# Patient Record
Sex: Female | Born: 1945 | Race: White | Hispanic: No | Marital: Married
Health system: Southern US, Academic
[De-identification: ages and names within clinical notes are randomized; demographics above are authoritative.]

## PROBLEM LIST (undated history)

## (undated) ENCOUNTER — Encounter: Attending: Gynecologic Oncology | Primary: Gynecologic Oncology

## (undated) ENCOUNTER — Encounter
Attending: Student in an Organized Health Care Education/Training Program | Primary: Student in an Organized Health Care Education/Training Program

## (undated) ENCOUNTER — Encounter

## (undated) ENCOUNTER — Encounter: Attending: Women's Health | Primary: Women's Health

## (undated) ENCOUNTER — Telehealth

## (undated) ENCOUNTER — Ambulatory Visit

## (undated) ENCOUNTER — Ambulatory Visit: Payer: MEDICARE

## (undated) ENCOUNTER — Telehealth: Attending: Gynecologic Oncology | Primary: Gynecologic Oncology

## (undated) ENCOUNTER — Telehealth: Attending: Women's Health | Primary: Women's Health

## (undated) ENCOUNTER — Ambulatory Visit: Payer: MEDICARE | Attending: Gynecologic Oncology | Primary: Gynecologic Oncology

## (undated) ENCOUNTER — Encounter: Attending: Child & Adolescent Psychiatry | Primary: Child & Adolescent Psychiatry

## (undated) ENCOUNTER — Telehealth
Attending: Student in an Organized Health Care Education/Training Program | Primary: Student in an Organized Health Care Education/Training Program

## (undated) ENCOUNTER — Ambulatory Visit
Payer: MEDICARE | Attending: Rehabilitative and Restorative Service Providers" | Primary: Rehabilitative and Restorative Service Providers"

## (undated) ENCOUNTER — Inpatient Hospital Stay

## (undated) ENCOUNTER — Encounter: Attending: Adult Health | Primary: Adult Health

## (undated) ENCOUNTER — Encounter
Attending: Rehabilitative and Restorative Service Providers" | Primary: Rehabilitative and Restorative Service Providers"

## (undated) ENCOUNTER — Ambulatory Visit: Payer: MEDICARE | Attending: Clinical | Primary: Clinical

## (undated) ENCOUNTER — Telehealth: Attending: Obstetrics & Gynecology | Primary: Obstetrics & Gynecology

## (undated) ENCOUNTER — Ambulatory Visit: Attending: Gynecologic Oncology | Primary: Gynecologic Oncology

## (undated) ENCOUNTER — Telehealth: Attending: Nurse Practitioner | Primary: Nurse Practitioner

## (undated) DIAGNOSIS — R32 Unspecified urinary incontinence: Secondary | ICD-10-CM

## (undated) DIAGNOSIS — F329 Major depressive disorder, single episode, unspecified: Secondary | ICD-10-CM

## (undated) DIAGNOSIS — F32A Depression, unspecified: Secondary | ICD-10-CM

## (undated) DIAGNOSIS — I10 Essential (primary) hypertension: Secondary | ICD-10-CM

## (undated) HISTORY — PX: FOOT SURGERY: SHX648

## (undated) MED ORDER — MEGESTROL 400 MG/10 ML (40 MG/ML) ORAL SUSPENSION: Freq: Every day | ORAL | 0.00000 days | Status: SS

## (undated) MED ORDER — TRAZODONE 100 MG TABLET: Freq: Every evening | ORAL | 0.00000 days | Status: SS

## (undated) MED ORDER — ALENDRONATE 70 MG TABLET: ORAL | 0.00000 days | Status: SS

---
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
## 2015-02-19 ENCOUNTER — Emergency Department: Payer: Medicare Other

## 2015-02-19 ENCOUNTER — Encounter: Payer: Self-pay | Admitting: Emergency Medicine

## 2015-02-19 ENCOUNTER — Emergency Department
Admission: EM | Admit: 2015-02-19 | Discharge: 2015-02-19 | Disposition: A | Discharge status: Home / Self Care | Payer: Medicare Other | Attending: Emergency Medicine | Admitting: Emergency Medicine

## 2015-02-19 DIAGNOSIS — R51 Headache: Secondary | ICD-10-CM | POA: Diagnosis not present

## 2015-02-19 DIAGNOSIS — W01198A Fall on same level from slipping, tripping and stumbling with subsequent striking against other object, initial encounter: Secondary | ICD-10-CM | POA: Diagnosis not present

## 2015-02-19 DIAGNOSIS — Y9389 Activity, other specified: Secondary | ICD-10-CM | POA: Diagnosis not present

## 2015-02-19 DIAGNOSIS — I10 Essential (primary) hypertension: Secondary | ICD-10-CM | POA: Insufficient documentation

## 2015-02-19 DIAGNOSIS — S0181XA Laceration without foreign body of other part of head, initial encounter: Secondary | ICD-10-CM | POA: Insufficient documentation

## 2015-02-19 DIAGNOSIS — Y998 Other external cause status: Secondary | ICD-10-CM | POA: Insufficient documentation

## 2015-02-19 DIAGNOSIS — Y9289 Other specified places as the place of occurrence of the external cause: Secondary | ICD-10-CM | POA: Insufficient documentation

## 2015-02-19 DIAGNOSIS — S5002XA Contusion of left elbow, initial encounter: Secondary | ICD-10-CM | POA: Diagnosis not present

## 2015-02-19 DIAGNOSIS — S0191XA Laceration without foreign body of unspecified part of head, initial encounter: Secondary | ICD-10-CM

## 2015-02-19 DIAGNOSIS — S60211A Contusion of right wrist, initial encounter: Secondary | ICD-10-CM | POA: Diagnosis not present

## 2015-02-19 DIAGNOSIS — S60212A Contusion of left wrist, initial encounter: Secondary | ICD-10-CM | POA: Diagnosis not present

## 2015-02-19 HISTORY — DX: Depression, unspecified: F32.A

## 2015-02-19 HISTORY — DX: Major depressive disorder, single episode, unspecified: F32.9

## 2015-02-19 HISTORY — DX: Unspecified urinary incontinence: R32

## 2015-02-19 HISTORY — DX: Essential (primary) hypertension: I10

## 2015-02-19 MED ORDER — LIDOCAINE-EPINEPHRINE 2 %-1:100000 IJ SOLN
INTRAMUSCULAR | Status: AC
  Filled 2015-02-19: qty 1.7

## 2015-02-19 MED ORDER — BACITRACIN ZINC 500 UNIT/GM EX OINT
TOPICAL_OINTMENT | Freq: Once | CUTANEOUS | Status: AC
  Administered 2015-02-19: 1 via TOPICAL

## 2015-02-19 MED ORDER — BACITRACIN ZINC 500 UNIT/GM EX OINT
TOPICAL_OINTMENT | CUTANEOUS | Status: AC
  Administered 2015-02-19: 1 via TOPICAL
  Filled 2015-02-19: qty 0.9

## 2015-02-19 NOTE — Discharge Instructions (Signed)
Stitches, Staples, or Adhesive Wound Closure Health care providers use stitches (sutures), staples, and certain glue (skin adhesives) to hold skin together while it heals (wound closure). You may need this treatment after you have surgery or if you cut your skin accidentally. These methods help your skin to heal more quickly and make it less likely that you will have a scar. A wound may take several months to heal completely. The type of wound you have determines when your wound gets closed. In most cases, the wound is closed as soon as possible (primary skin closure). Sometimes, closure is delayed so the wound can be cleaned and allowed to heal naturally. This reduces the chance of infection. Delayed closure may be needed if your wound:  Is caused by a bite.  Happened more than 6 hours ago.  Involves loss of skin or the tissues under the skin.  Has dirt or debris in it that cannot be removed.  Is infected. WHAT ARE THE DIFFERENT KINDS OF WOUND CLOSURES? There are many options for wound closure. The one that your health care provider uses depends on how deep and how large your wound is. Adhesive Glue To use this type of glue to close a wound, your health care provider holds the edges of the wound together and paints the glue on the surface of your skin. You may need more than one layer of glue. Then the wound may be covered with a light bandage (dressing). This type of skin closure may be used for small wounds that are not deep (superficial). Using glue for wound closure is less painful than other methods. It does not require a medicine that numbs the area (local anesthetic). This method also leaves nothing to be removed. Adhesive glue is often used for children and on facial wounds. Adhesive glue cannot be used for wounds that are deep, uneven, or bleeding. It is not used inside of a wound.  Adhesive Strips These strips are made of sticky (adhesive), porous paper. They are applied across your  skin edges like a regular adhesive bandage. You leave them on until they fall off. Adhesive strips may be used to close very superficial wounds. They may also be used along with sutures to improve the closure of your skin edges.  Sutures Sutures are the oldest method of wound closure. Sutures can be made from natural substances, such as silk, or from synthetic materials, such as nylon and steel. They can be made from a material that your body can break down as your wound heals (absorbable), or they can be made from a material that needs to be removed from your skin (nonabsorbable). They come in many different strengths and sizes. Your health care provider attaches the sutures to a steel needle on one end. Sutures can be passed through your skin, or through the tissues beneath your skin. Then they are tied and cut. Your skin edges may be closed in one continuous stitch or in separate stitches. Sutures are strong and can be used for all kinds of wounds. Absorbable sutures may be used to close tissues under the skin. The disadvantage of sutures is that they may cause skin reactions that lead to infection. Nonabsorbable sutures need to be removed. Staples When surgical staples are used to close a wound, the edges of your skin on both sides of the wound are brought close together. A staple is placed across the wound, and an instrument secures the edges together. Staples are often used to close surgical cuts (  incisions). Staples are faster to use than sutures, and they cause less skin reaction. Staples need to be removed using a tool that bends the staples away from your skin. HOW DO I CARE FOR MY WOUND CLOSURE?  Take medicines only as directed by your health care provider.  If you were prescribed an antibiotic medicine for your wound, finish it all even if you start to feel better.  Use ointments or creams only as directed by your health care provider.  Wash your hands with soap and water before and  after touching your wound.  Do not soak your wound in water. Do not take baths, swim, or use a hot tub until your health care provider approves.  Ask your health care provider when you can start showering. Cover your wound if directed by your health care provider.  Do not take out your own sutures or staples.  Do not pick at your wound. Picking can cause an infection.  Keep all follow-up visits as directed by your health care provider. This is important. HOW LONG WILL I HAVE MY WOUND CLOSURE?  Leave adhesive glue on your skin until the glue peels away.  Leave adhesive strips on your skin until the strips fall off.  Absorbable sutures will dissolve within several days.  Nonabsorbable sutures and staples must be removed. The location of the wound will determine how long they stay in. This can range from several days to a couple of weeks. WHEN SHOULD I SEEK HELP FOR MY WOUND CLOSURE? Contact your health care provider if:  You have a fever.  You have chills.  You have drainage, redness, swelling, or pain at your wound.  There is a bad smell coming from your wound.  The skin edges of your wound start to separate after your sutures have been removed.  Your wound becomes thick, raised, and darker in color after your sutures come out (scarring).   This information is not intended to replace advice given to you by your health care provider. Make sure you discuss any questions you have with your health care provider.   Document Released: 12/25/2000 Document Revised: 04/22/2014 Document Reviewed: 09/08/2013 Elsevier Interactive Patient Education [employer]2016 [employer]  Please keep the wound clean and dry. You can shower or even tonight do not submerge her head in standing water. The careful not to catch a comb or brush in the staples. Please return for worse headache nausea vomiting visual changes. Please also return if the wound gets more painful swollen red or pussy. I recommend getting  the wound checked in 2 days and I would get the staples out in 7.

## 2015-02-19 NOTE — ED Provider Notes (Signed)
[facility] Emergency Department Provider Note  ____________________________________________  Time seen: Approximately 6:22 PM  I have reviewed the triage vital signs and the nursing notes.   HISTORY  Chief Complaint Fall and Head Laceration    HPI Kayla Ferrell is a 69 y.o. female who tripped and fell hitting her elbow and head on a car. Patient did not pass out is not nauseated and vomiting has no visual changes or ringing in the ears. Patient says she can move her wrist which is also somewhat bruised and elbow without any pain or or impairment. Patient has fallen in the past but not in the last year. Patient does have a mild headache after the fall.   Past Medical History  Diagnosis Date  . Hypertension   . Depression   . Incontinence     There are no active problems to display for this patient.   Past Surgical History  Procedure Laterality Date  . Foot surgery      left foot    No current outpatient prescriptions on file.  Allergies Codeine and Sulfa antibiotics  History reviewed. No pertinent family history.  Social History Social History  Substance Use Topics  . Smoking status: Never Smoker   . Smokeless tobacco: None  . Alcohol Use: Yes    Review of Systems Constitutional: No fever/chills Eyes: No visual changes. ENT: No sore throat. Cardiovascular: Denies chest pain. Respiratory: Denies shortness of breath. Gastrointestinal: No abdominal pain.  No nausea, no vomiting.  No diarrhea.  No constipation. Genitourinary: Negative for dysuria. Musculoskeletal: Negative for back pain. Skin: Negative for rash. Neurological: Negative for  focal weakness or numbness.  10-point ROS otherwise negative.  ____________________________________________   PHYSICAL EXAM:  VITAL SIGNS: ED Triage Vitals  Enc Vitals Group     BP 02/19/15 1530 129/69 mmHg     Pulse Rate 02/19/15 1530 81     Resp 02/19/15 1530 18     Temp 02/19/15 1530  97.8 F (36.6 C)     Temp Source 02/19/15 1530 Oral     SpO2 02/19/15 1530 99 %     Weight 02/19/15 1530 158 lb (71.668 kg)     Height 02/19/15 1530 5\' 1"  (1.549 m)     Head Cir --      Peak Flow --      Pain Score 02/19/15 1531 2     Pain Loc --      Pain Edu? --      Excl. in GC? --     Constitutional: Alert and oriented. Well appearing and in no acute distress. Eyes: Conjunctivae are normal. PERRL. EOMI. Head: Atraumatic except for 1-1/2 cm laceration in the area left temple. It is not bleeding at present Nose: No congestion/rhinnorhea. Mouth/Throat: Mucous membranes are moist.  Oropharynx non-erythematous. Neck: No stridor.  No cervical spine tenderness to palpation. }Cardiovascular: Normal rate, regular rhythm. Grossly normal heart sounds.  Good peripheral circulation. Respiratory: Normal respiratory effort.  No retractions. Lungs CTAB. Gastrointestinal: Soft and nontender. No distention. No abdominal bruits. No CVA tenderness. }Musculoskeletal: No lower extremity tenderness nor edema.  No joint effusions. patient moves left elbow and wrist both of which are bruised and abraded without any problems at all there is no limitation in range of motion or pain with movement  Neurologic:  Normal speech and language. No gross focal neurologic deficits are appreciated. No gait instability. Skin:  Skin is warm, dry and intact. No rash noted. Psychiatric: Mood and affect are  normal. Speech and behavior are normal.  ____________________________________________   LABS (all labs ordered are listed, but only abnormal results are displayed)  Labs Reviewed - No data to display ____________________________________________  EKG   ____________________________________________  RADCT of the head shows no acute findings ____________________________________________   PROCEDURES   consent was obtained patient laceration was anesthetized with 1% lidocaine with epi wound was cleaned that is  the skin of the wound was cleaned with Betadine and the wound was irrigated with normal saline the wound was then explored no foreign bodies were seen in the galea was not penetrated the wound was closed with 4 staples patient tolerated this very well. ____________________________________________   INITIAL IMPRESSION / ASSESSMENT AND PLAN / ED COURSE  Pertinent labs & imaging results that were available during my care of the patient were reviewed by me and considered in my medical decision making (see chart for details).  patient will follow-up with her doctor or the ER in [facility] to get the staples out ____________________________________________   FINAL CLINICAL IMPRESSION(S) / ED DIAGNOSES  Final diagnoses:  Laceration of head, initial encounter      Arnaldo Natal, MD 02/19/15 [phone]

## 2015-02-19 NOTE — ED Notes (Signed)
Pt presents via EMS from home with a fall and head laceration. Pt slipped and fell outside over concrete , broke her fall with left arm and elbow, hit head, and hs a 1 inch left side laceration. Pt is alert and oriented x4 on arrival.

## 2016-10-18 ENCOUNTER — Ambulatory Visit: Admission: RE | Admit: 2016-10-18 | Discharge: 2016-10-18 | Disposition: A | Discharge status: Home / Self Care

## 2016-10-18 DIAGNOSIS — M810 Age-related osteoporosis without current pathological fracture: Principal | ICD-10-CM

## 2016-10-18 DIAGNOSIS — R2689 Other abnormalities of gait and mobility: Secondary | ICD-10-CM

## 2016-10-18 DIAGNOSIS — Z9181 History of falling: Secondary | ICD-10-CM

## 2016-10-18 DIAGNOSIS — Z79899 Other long term (current) drug therapy: Secondary | ICD-10-CM

## 2016-10-18 DIAGNOSIS — Z9189 Other specified personal risk factors, not elsewhere classified: Secondary | ICD-10-CM

## 2016-10-18 DIAGNOSIS — Z0000 Encounter for general adult medical examination without abnormal findings: Secondary | ICD-10-CM

## 2016-10-23 MED ORDER — OXYBUTYNIN CHLORIDE 5 MG TABLET
ORAL_TABLET | Freq: Three times a day (TID) | ORAL | 0 refills | 0 days | Status: CP
Start: 2016-10-23 — End: 2016-12-10

## 2016-11-29 ENCOUNTER — Ambulatory Visit
Admission: RE | Admit: 2016-11-29 | Discharge: 2016-11-29 | Disposition: A | Discharge status: Home / Self Care | Attending: Internal Medicine | Admitting: Internal Medicine

## 2016-11-29 DIAGNOSIS — M810 Age-related osteoporosis without current pathological fracture: Principal | ICD-10-CM

## 2016-11-29 MED ORDER — ALENDRONATE 70 MG TABLET
ORAL_TABLET | ORAL | 3 refills | 0.00000 days | Status: CP
Start: 2016-11-29 — End: 2017-12-09

## 2016-12-10 ENCOUNTER — Ambulatory Visit: Admission: RE | Admit: 2016-12-10 | Discharge: 2016-12-10

## 2016-12-10 DIAGNOSIS — I10 Essential (primary) hypertension: Principal | ICD-10-CM

## 2016-12-10 DIAGNOSIS — R634 Abnormal weight loss: Secondary | ICD-10-CM

## 2016-12-10 DIAGNOSIS — R5383 Other fatigue: Secondary | ICD-10-CM

## 2016-12-10 DIAGNOSIS — R0781 Pleurodynia: Secondary | ICD-10-CM

## 2016-12-10 MED ORDER — LISINOPRIL 5 MG TABLET
ORAL_TABLET | Freq: Every day | ORAL | 1 refills | 0 days | Status: SS
Start: 2016-12-10 — End: 2017-07-02

## 2016-12-10 MED ORDER — OXYBUTYNIN CHLORIDE 5 MG TABLET
ORAL_TABLET | Freq: Two times a day (BID) | ORAL | 1 refills | 0 days | Status: CP
Start: 2016-12-10 — End: 2017-07-26

## 2017-01-10 ENCOUNTER — Ambulatory Visit
Admission: RE | Admit: 2017-01-10 | Discharge: 2017-02-08 | Disposition: A | Discharge status: Home / Self Care | Payer: MEDICARE | Attending: Rehabilitative and Restorative Service Providers" | Admitting: Rehabilitative and Restorative Service Providers"

## 2017-01-10 ENCOUNTER — Ambulatory Visit
Admission: RE | Admit: 2017-01-10 | Discharge: 2017-02-08 | Disposition: A | Discharge status: Home / Self Care | Attending: Rehabilitative and Restorative Service Providers" | Admitting: Rehabilitative and Restorative Service Providers"

## 2017-01-10 DIAGNOSIS — R2689 Other abnormalities of gait and mobility: Secondary | ICD-10-CM

## 2017-01-10 DIAGNOSIS — Z9181 History of falling: Principal | ICD-10-CM

## 2017-01-15 ENCOUNTER — Ambulatory Visit: Admission: RE | Admit: 2017-01-15 | Discharge: 2017-01-15 | Disposition: A | Discharge status: Home / Self Care

## 2017-01-15 DIAGNOSIS — R5383 Other fatigue: Secondary | ICD-10-CM

## 2017-01-15 DIAGNOSIS — I10 Essential (primary) hypertension: Principal | ICD-10-CM

## 2017-01-15 DIAGNOSIS — R634 Abnormal weight loss: Secondary | ICD-10-CM

## 2017-01-15 DIAGNOSIS — R0781 Pleurodynia: Secondary | ICD-10-CM

## 2017-01-24 DIAGNOSIS — M6281 Muscle weakness (generalized): Secondary | ICD-10-CM

## 2017-01-24 DIAGNOSIS — R2689 Other abnormalities of gait and mobility: Secondary | ICD-10-CM

## 2017-01-24 DIAGNOSIS — Z9181 History of falling: Principal | ICD-10-CM

## 2017-01-31 DIAGNOSIS — Z9181 History of falling: Principal | ICD-10-CM

## 2017-01-31 DIAGNOSIS — R2689 Other abnormalities of gait and mobility: Secondary | ICD-10-CM

## 2017-01-31 DIAGNOSIS — M6281 Muscle weakness (generalized): Secondary | ICD-10-CM

## 2017-02-07 DIAGNOSIS — M6281 Muscle weakness (generalized): Secondary | ICD-10-CM

## 2017-02-07 DIAGNOSIS — Z9181 History of falling: Principal | ICD-10-CM

## 2017-02-07 DIAGNOSIS — R2689 Other abnormalities of gait and mobility: Secondary | ICD-10-CM

## 2017-02-14 ENCOUNTER — Ambulatory Visit
Admission: RE | Admit: 2017-02-14 | Discharge: 2017-03-10 | Disposition: A | Discharge status: Home / Self Care | Attending: Rehabilitative and Restorative Service Providers" | Admitting: Rehabilitative and Restorative Service Providers"

## 2017-02-14 DIAGNOSIS — Z9181 History of falling: Principal | ICD-10-CM

## 2017-02-14 DIAGNOSIS — R2689 Other abnormalities of gait and mobility: Secondary | ICD-10-CM

## 2017-02-14 DIAGNOSIS — M6281 Muscle weakness (generalized): Secondary | ICD-10-CM

## 2017-02-26 IMAGING — CT CT HEAD W/O CM
1 series · 16 of 30 positions shown, 20 images · non-contrast
Comparison: None.

CLINICAL DATA: Fell today.  Hit head.

EXAM:
CT HEAD WITHOUT CONTRAST
TECHNIQUE: Contiguous axial images were obtained from the base of the skull
through the vertex without intravenous contrast.

[Series 2: head wo · axial · 0.39mm/px · z∈[-187,-58]mm · 16 of 30 slices shown, 20 images]
[im 2/30  brain]
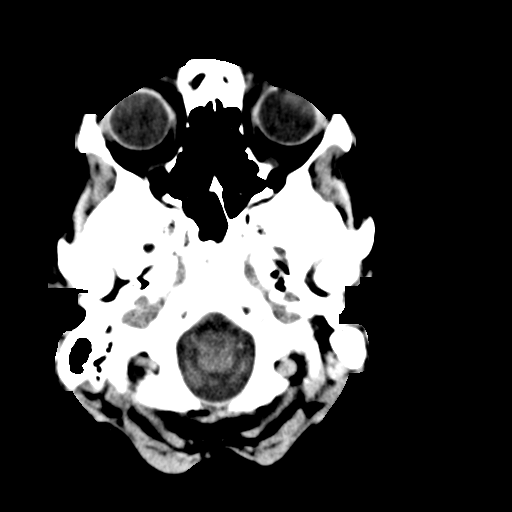
[im 2/30  bone]
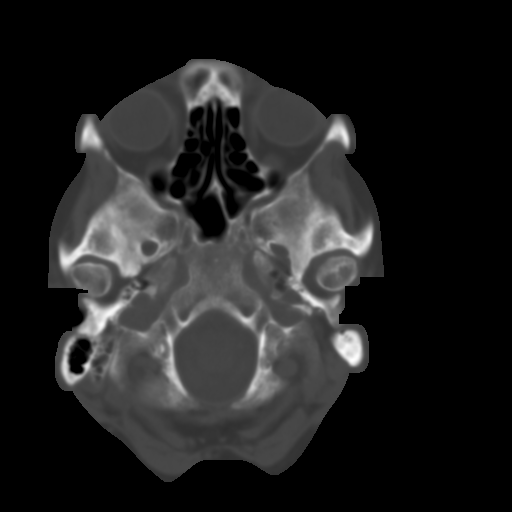
[im 4/30  brain]
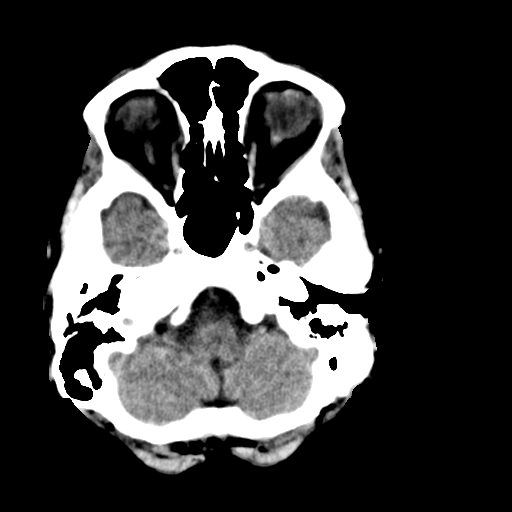
[im 6/30  brain]
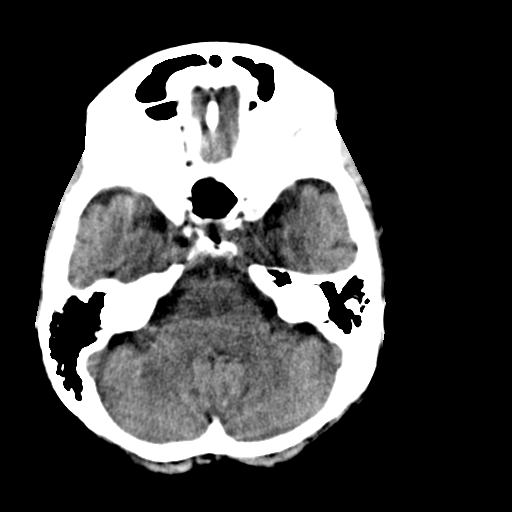
[im 8/30  brain]
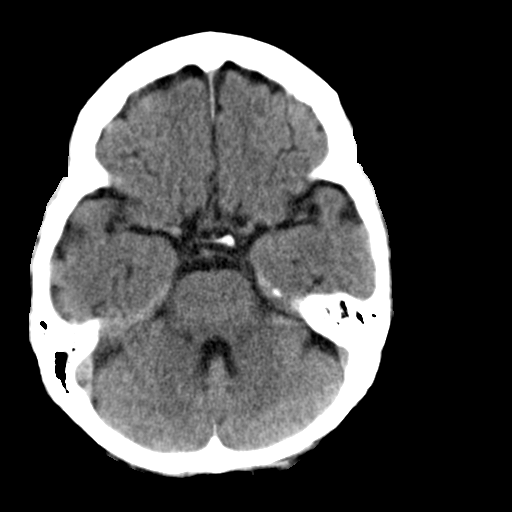
[im 9/30  brain]
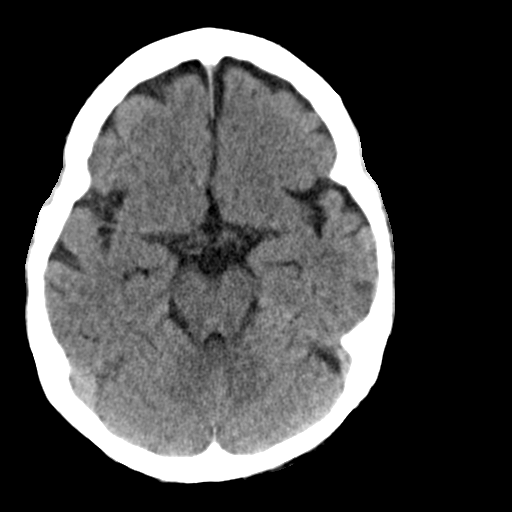
[im 9/30  bone]
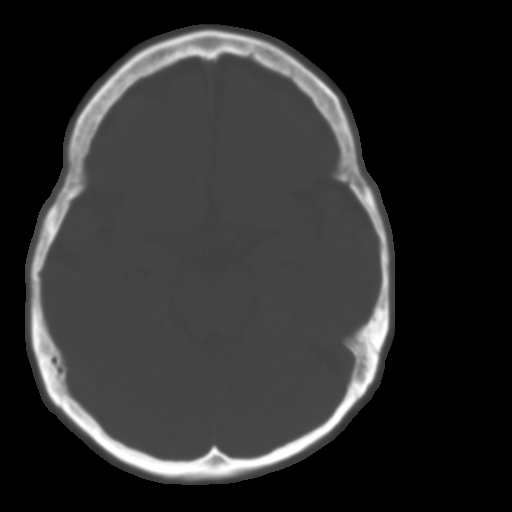
[im 11/30  brain]
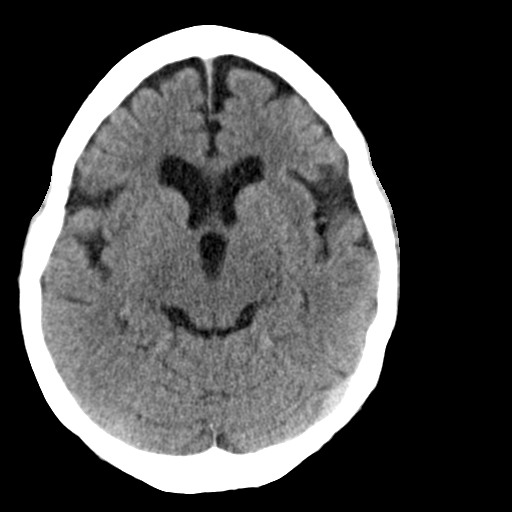
[im 13/30  brain]
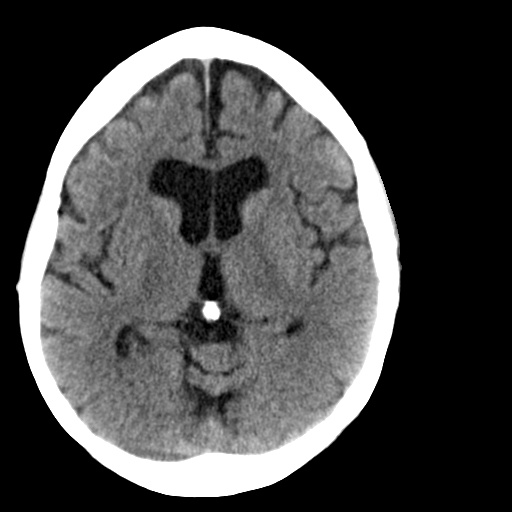
[im 15/30  brain]
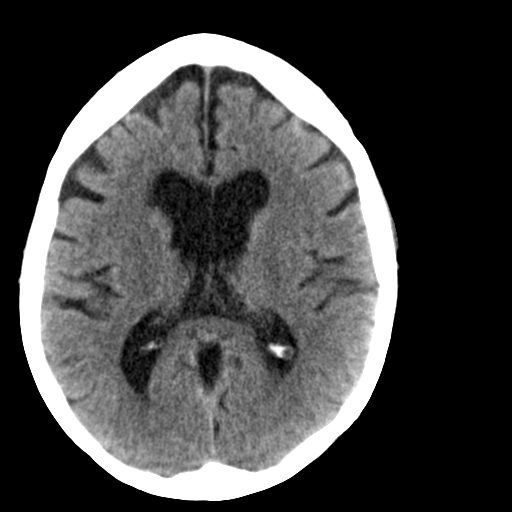
[im 16/30  brain]
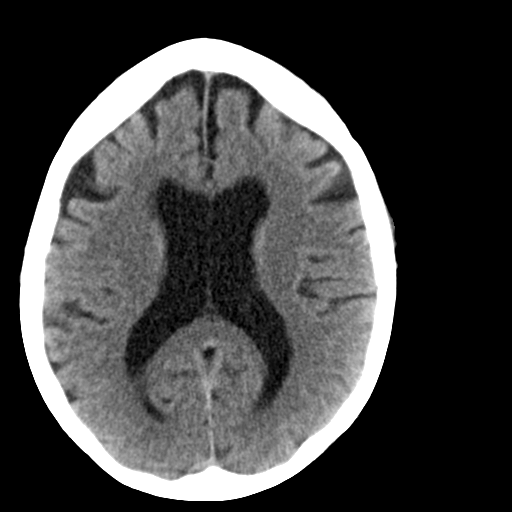
[im 16/30  bone]
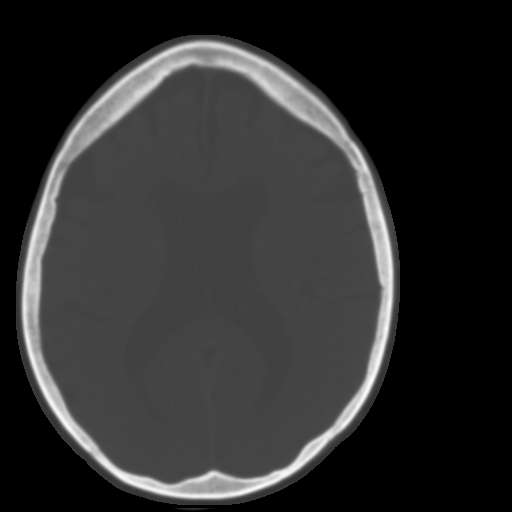
[im 18/30  brain]
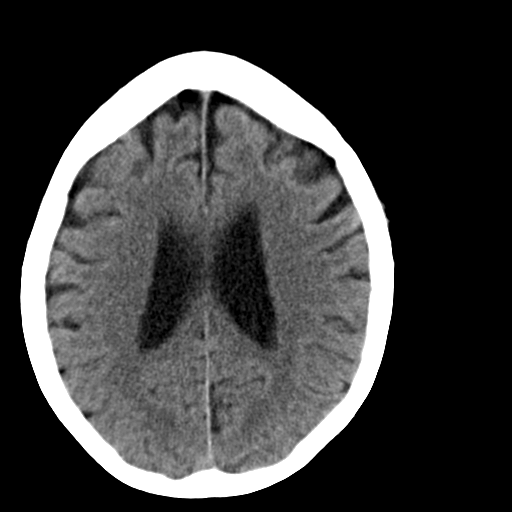
[im 20/30  brain]
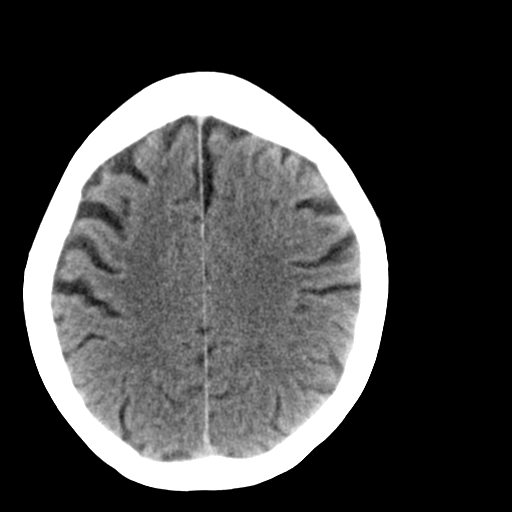
[im 22/30  brain]
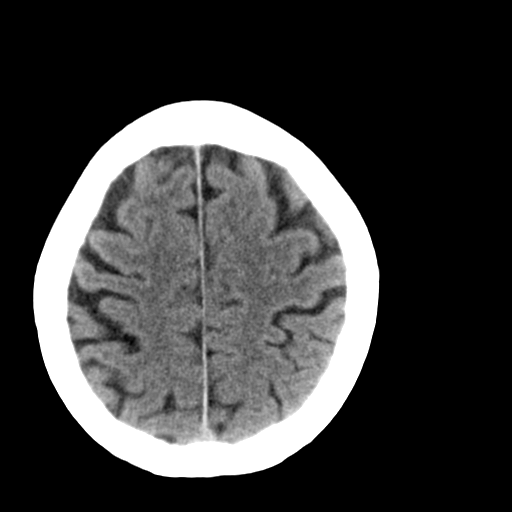
[im 23/30  brain]
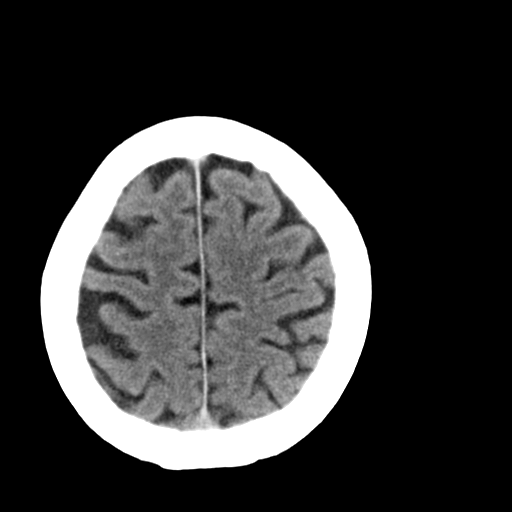
[im 23/30  bone]
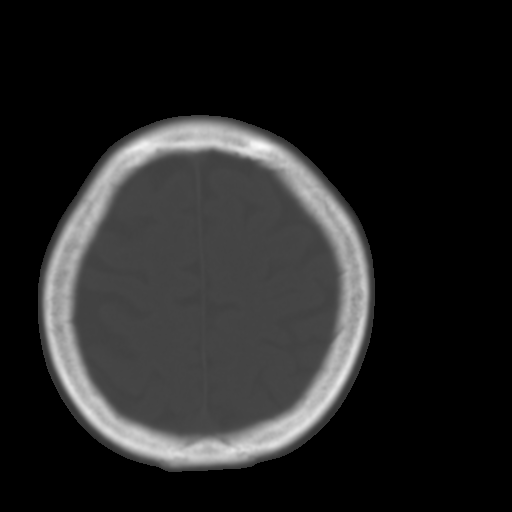
[im 25/30  brain]
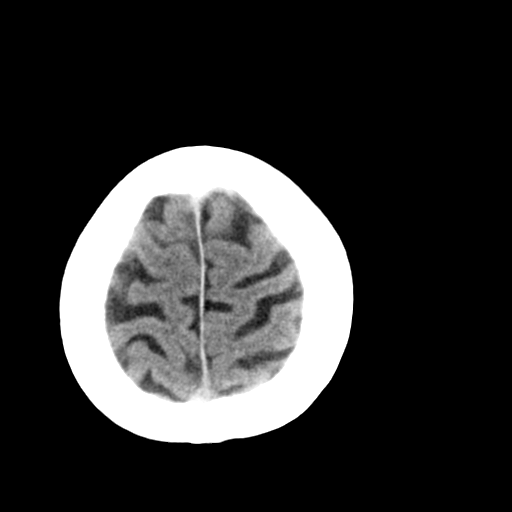
[im 27/30  brain]
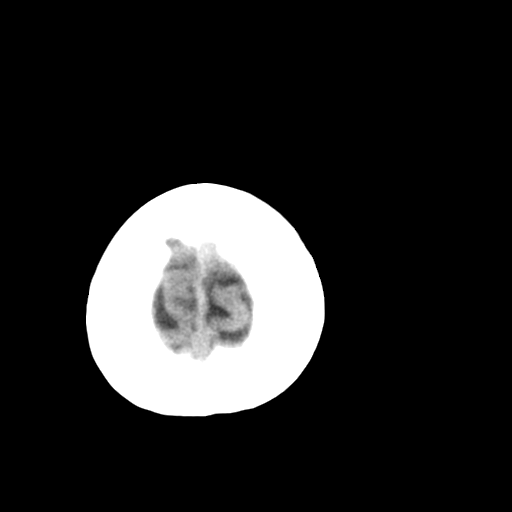
[im 29/30  brain]
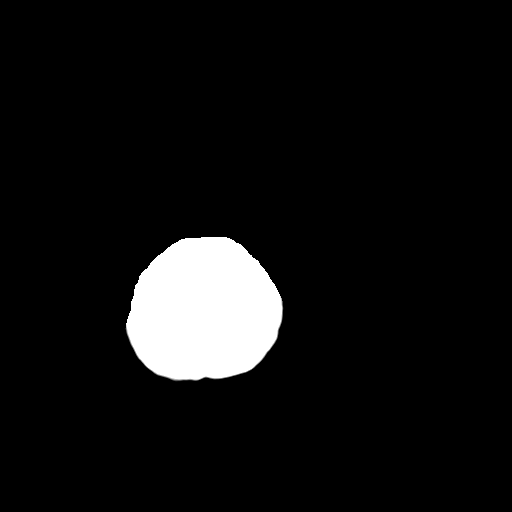

[16 of 30 positions shown; findings below may reference images not displayed]

FINDINGS: The ventricles are normal in size and configuration. No extra-axial
fluid collections are identified. The gray-white differentiation is
normal. No CT findings for acute intracranial process such as
hemorrhage or infarction. No mass lesions. The brainstem and
cerebellum are grossly normal.

The bony structures are intact. The paranasal sinuses and mastoid
air cells are clear. The globes are intact.
IMPRESSION: No acute intracranial findings or skull fracture.

## 2017-03-16 ENCOUNTER — Emergency Department
Admission: EM | Admit: 2017-03-16 | Discharge: 2017-03-16 | Disposition: A | Discharge status: Home / Self Care | Payer: MEDICARE | Source: Intra-hospital | Attending: Family | Admitting: Family

## 2017-03-16 ENCOUNTER — Emergency Department
Admission: EM | Admit: 2017-03-16 | Discharge: 2017-03-16 | Disposition: A | Discharge status: Home / Self Care | Payer: MEDICARE | Source: Intra-hospital

## 2017-03-16 DIAGNOSIS — R109 Unspecified abdominal pain: Principal | ICD-10-CM

## 2017-04-10 ENCOUNTER — Ambulatory Visit
Admission: RE | Admit: 2017-04-10 | Discharge: 2017-05-09 | Disposition: A | Discharge status: Home / Self Care | Attending: Rehabilitative and Restorative Service Providers" | Admitting: Rehabilitative and Restorative Service Providers"

## 2017-04-10 ENCOUNTER — Ambulatory Visit
Admission: RE | Admit: 2017-04-10 | Discharge: 2017-05-09 | Disposition: A | Discharge status: Home / Self Care | Payer: MEDICARE | Attending: Rehabilitative and Restorative Service Providers" | Admitting: Rehabilitative and Restorative Service Providers"

## 2017-04-10 DIAGNOSIS — R2689 Other abnormalities of gait and mobility: Secondary | ICD-10-CM

## 2017-04-10 DIAGNOSIS — M6281 Muscle weakness (generalized): Secondary | ICD-10-CM

## 2017-04-10 DIAGNOSIS — Z9181 History of falling: Principal | ICD-10-CM

## 2017-05-13 ENCOUNTER — Ambulatory Visit: Admit: 2017-05-13 | Discharge: 2017-05-14 | Payer: MEDICARE

## 2017-05-13 DIAGNOSIS — K5900 Constipation, unspecified: Secondary | ICD-10-CM

## 2017-05-13 DIAGNOSIS — R935 Abnormal findings on diagnostic imaging of other abdominal regions, including retroperitoneum: Secondary | ICD-10-CM

## 2017-05-13 DIAGNOSIS — R109 Unspecified abdominal pain: Secondary | ICD-10-CM

## 2017-05-13 DIAGNOSIS — I10 Essential (primary) hypertension: Principal | ICD-10-CM

## 2017-05-13 DIAGNOSIS — Z79899 Other long term (current) drug therapy: Secondary | ICD-10-CM

## 2017-06-16 ENCOUNTER — Ambulatory Visit: Admit: 2017-06-16 | Discharge: 2017-06-17 | Payer: MEDICARE

## 2017-06-16 DIAGNOSIS — K5900 Constipation, unspecified: Principal | ICD-10-CM

## 2017-06-16 DIAGNOSIS — R935 Abnormal findings on diagnostic imaging of other abdominal regions, including retroperitoneum: Secondary | ICD-10-CM

## 2017-06-16 DIAGNOSIS — R109 Unspecified abdominal pain: Secondary | ICD-10-CM

## 2017-06-17 ENCOUNTER — Ambulatory Visit: Admit: 2017-06-17 | Discharge: 2017-06-18 | Payer: MEDICARE

## 2017-06-17 DIAGNOSIS — R1909 Other intra-abdominal and pelvic swelling, mass and lump: Secondary | ICD-10-CM

## 2017-06-17 DIAGNOSIS — N838 Other noninflammatory disorders of ovary, fallopian tube and broad ligament: Principal | ICD-10-CM

## 2017-06-27 ENCOUNTER — Ambulatory Visit: Admit: 2017-06-27 | Discharge: 2017-06-27 | Payer: MEDICARE

## 2017-06-27 DIAGNOSIS — Z1239 Encounter for other screening for malignant neoplasm of breast: Principal | ICD-10-CM

## 2017-06-30 DIAGNOSIS — N838 Other noninflammatory disorders of ovary, fallopian tube and broad ligament: Principal | ICD-10-CM

## 2017-06-30 DIAGNOSIS — Z01818 Encounter for other preprocedural examination: Secondary | ICD-10-CM

## 2017-06-30 DIAGNOSIS — N9489 Other specified conditions associated with female genital organs and menstrual cycle: Principal | ICD-10-CM

## 2017-06-30 DIAGNOSIS — C569 Malignant neoplasm of unspecified ovary: Principal | ICD-10-CM

## 2017-06-30 DIAGNOSIS — R1909 Other intra-abdominal and pelvic swelling, mass and lump: Secondary | ICD-10-CM

## 2017-07-01 ENCOUNTER — Ambulatory Visit
Admit: 2017-07-01 | Discharge: 2017-07-09 | Disposition: A | Discharge status: Skilled Nursing Facility | Payer: MEDICARE | Attending: Gynecologic Oncology | Admitting: Gynecologic Oncology

## 2017-07-01 ENCOUNTER — Ambulatory Visit
Admit: 2017-07-01 | Discharge: 2017-07-09 | Disposition: A | Discharge status: Skilled Nursing Facility | Payer: MEDICARE | Admitting: Gynecologic Oncology

## 2017-07-01 ENCOUNTER — Encounter
Admit: 2017-07-01 | Discharge: 2017-07-09 | Disposition: A | Discharge status: Skilled Nursing Facility | Payer: MEDICARE | Attending: Anesthesiology | Admitting: Gynecologic Oncology

## 2017-07-01 DIAGNOSIS — C569 Malignant neoplasm of unspecified ovary: Principal | ICD-10-CM

## 2017-07-04 DIAGNOSIS — C569 Malignant neoplasm of unspecified ovary: Principal | ICD-10-CM

## 2017-07-09 MED ORDER — POLYETHYLENE GLYCOL 3350 17 GRAM ORAL POWDER PACKET
PACK | Freq: Every day | ORAL | 0 refills | 0.00000 days | Status: CP | PRN
Start: 2017-07-09 — End: 2017-08-13

## 2017-07-09 MED ORDER — ENOXAPARIN 40 MG/0.4 ML SUBCUTANEOUS SYRINGE
SUBCUTANEOUS | 0 refills | 0 days | Status: CP
Start: 2017-07-09 — End: 2017-07-22

## 2017-07-09 MED ORDER — IBUPROFEN 600 MG TABLET
ORAL_TABLET | Freq: Four times a day (QID) | ORAL | 0 refills | 0.00000 days | Status: CP | PRN
Start: 2017-07-09 — End: 2018-01-19

## 2017-07-09 MED ORDER — ACETAMINOPHEN ER 650 MG TABLET,EXTENDED RELEASE
ORAL_TABLET | Freq: Four times a day (QID) | ORAL | 0 refills | 0.00000 days | Status: CP | PRN
Start: 2017-07-09 — End: 2018-01-19

## 2017-07-09 MED ORDER — FERROUS GLUCONATE 324 MG (38 MG IRON) TABLET
ORAL_TABLET | Freq: Every day | ORAL | 11 refills | 0.00000 days | Status: CP
Start: 2017-07-09 — End: 2017-07-22

## 2017-07-09 MED ORDER — LISINOPRIL 5 MG TABLET
ORAL_TABLET | Freq: Every day | ORAL | 0 refills | 0.00000 days | Status: CP
Start: 2017-07-09 — End: 2017-07-22

## 2017-07-09 MED ORDER — OXYCODONE 5 MG TABLET
ORAL_TABLET | ORAL | 0 refills | 0.00000 days | Status: CP | PRN
Start: 2017-07-09 — End: 2017-07-22

## 2017-07-11 ENCOUNTER — Institutional Professional Consult (permissible substitution): Admit: 2017-07-11 | Discharge: 2017-07-12 | Payer: MEDICARE

## 2017-07-16 MED ORDER — PROCHLORPERAZINE MALEATE 10 MG TABLET
ORAL_TABLET | Freq: Four times a day (QID) | ORAL | 2 refills | 0 days | Status: CP | PRN
Start: 2017-07-16 — End: 2018-01-14

## 2017-07-16 MED ORDER — ONDANSETRON HCL 8 MG TABLET
ORAL_TABLET | Freq: Three times a day (TID) | ORAL | 2 refills | 0.00000 days | Status: CP | PRN
Start: 2017-07-16 — End: 2018-01-14

## 2017-07-18 ENCOUNTER — Ambulatory Visit: Admit: 2017-07-18 | Discharge: 2017-07-19 | Payer: MEDICARE

## 2017-07-18 DIAGNOSIS — Z5111 Encounter for antineoplastic chemotherapy: Secondary | ICD-10-CM

## 2017-07-18 DIAGNOSIS — C5700 Malignant neoplasm of unspecified fallopian tube: Principal | ICD-10-CM

## 2017-07-21 ENCOUNTER — Ambulatory Visit: Admit: 2017-07-21 | Discharge: 2017-07-21 | Payer: MEDICARE

## 2017-07-21 ENCOUNTER — Ambulatory Visit
Admit: 2017-07-21 | Discharge: 2017-07-21 | Payer: MEDICARE | Attending: Gynecologic Oncology | Primary: Gynecologic Oncology

## 2017-07-21 DIAGNOSIS — C5700 Malignant neoplasm of unspecified fallopian tube: Secondary | ICD-10-CM

## 2017-07-21 DIAGNOSIS — E876 Hypokalemia: Secondary | ICD-10-CM

## 2017-07-21 DIAGNOSIS — Z5111 Encounter for antineoplastic chemotherapy: Principal | ICD-10-CM

## 2017-07-21 MED ORDER — POTASSIUM CHLORIDE ER 10 MEQ TABLET, EXTENDED RELEASE WRAPPER
ORAL_TABLET | Freq: Every day | ORAL | 1 refills | 0 days | Status: CP
Start: 2017-07-21 — End: 2018-01-20

## 2017-07-21 MED ORDER — MAGNESIUM OXIDE 400 MG (241.3 MG MAGNESIUM) TABLET
ORAL_TABLET | Freq: Every day | ORAL | 1 refills | 0 days | Status: CP
Start: 2017-07-21 — End: 2017-07-22

## 2017-07-22 ENCOUNTER — Ambulatory Visit: Admit: 2017-07-22 | Discharge: 2017-07-23 | Payer: MEDICARE

## 2017-07-22 DIAGNOSIS — C5700 Malignant neoplasm of unspecified fallopian tube: Secondary | ICD-10-CM

## 2017-07-22 DIAGNOSIS — Z5111 Encounter for antineoplastic chemotherapy: Secondary | ICD-10-CM

## 2017-07-22 DIAGNOSIS — I10 Essential (primary) hypertension: Principal | ICD-10-CM

## 2017-07-22 MED ORDER — LISINOPRIL 20 MG TABLET
ORAL_TABLET | Freq: Every day | ORAL | 5 refills | 0.00000 days | Status: CP
Start: 2017-07-22 — End: 2018-03-02

## 2017-07-22 MED ORDER — LISINOPRIL 20 MG TABLET: 20 mg | tablet | Freq: Every day | 5 refills | 0 days | Status: AC

## 2017-07-22 MED ORDER — MAGNESIUM OXIDE 400 MG (241.3 MG MAGNESIUM) TABLET
ORAL_TABLET | Freq: Every day | ORAL | 1 refills | 0.00000 days
Start: 2017-07-22 — End: 2018-01-20

## 2017-07-25 ENCOUNTER — Ambulatory Visit: Admit: 2017-07-25 | Discharge: 2017-07-26 | Payer: MEDICARE

## 2017-07-25 DIAGNOSIS — R350 Frequency of micturition: Principal | ICD-10-CM

## 2017-07-26 MED ORDER — OXYBUTYNIN CHLORIDE 5 MG TABLET
ORAL_TABLET | Freq: Four times a day (QID) | ORAL | 1 refills | 0 days | Status: CP
Start: 2017-07-26 — End: 2018-03-02

## 2017-07-29 MED ORDER — ATORVASTATIN 10 MG TABLET
ORAL_TABLET | Freq: Every evening | ORAL | 3 refills | 0 days | Status: CP
Start: 2017-07-29 — End: 2018-08-14

## 2017-08-08 ENCOUNTER — Ambulatory Visit: Admit: 2017-08-08 | Discharge: 2017-08-09 | Payer: MEDICARE

## 2017-08-08 DIAGNOSIS — C5700 Malignant neoplasm of unspecified fallopian tube: Principal | ICD-10-CM

## 2017-08-08 DIAGNOSIS — Z5111 Encounter for antineoplastic chemotherapy: Secondary | ICD-10-CM

## 2017-08-11 ENCOUNTER — Ambulatory Visit: Admit: 2017-08-11 | Discharge: 2017-08-11 | Payer: MEDICARE | Attending: Registered" | Primary: Registered"

## 2017-08-11 ENCOUNTER — Ambulatory Visit: Admit: 2017-08-11 | Discharge: 2017-08-11 | Payer: MEDICARE

## 2017-08-11 DIAGNOSIS — Z5111 Encounter for antineoplastic chemotherapy: Secondary | ICD-10-CM

## 2017-08-11 DIAGNOSIS — C5700 Malignant neoplasm of unspecified fallopian tube: Principal | ICD-10-CM

## 2017-08-13 ENCOUNTER — Ambulatory Visit: Admit: 2017-08-13 | Discharge: 2017-08-14 | Payer: MEDICARE

## 2017-08-13 DIAGNOSIS — Z79899 Other long term (current) drug therapy: Secondary | ICD-10-CM

## 2017-08-13 DIAGNOSIS — R5383 Other fatigue: Secondary | ICD-10-CM

## 2017-08-13 DIAGNOSIS — R2689 Other abnormalities of gait and mobility: Secondary | ICD-10-CM

## 2017-08-13 DIAGNOSIS — I10 Essential (primary) hypertension: Secondary | ICD-10-CM

## 2017-08-13 DIAGNOSIS — M25561 Pain in right knee: Secondary | ICD-10-CM

## 2017-08-13 DIAGNOSIS — Z0000 Encounter for general adult medical examination without abnormal findings: Principal | ICD-10-CM

## 2017-08-13 DIAGNOSIS — G8929 Other chronic pain: Secondary | ICD-10-CM

## 2017-08-29 ENCOUNTER — Ambulatory Visit: Admit: 2017-08-29 | Discharge: 2017-08-30 | Payer: MEDICARE

## 2017-08-29 DIAGNOSIS — C5700 Malignant neoplasm of unspecified fallopian tube: Principal | ICD-10-CM

## 2017-08-29 DIAGNOSIS — Z5111 Encounter for antineoplastic chemotherapy: Secondary | ICD-10-CM

## 2017-09-01 ENCOUNTER — Ambulatory Visit: Admit: 2017-09-01 | Discharge: 2017-09-02 | Payer: MEDICARE

## 2017-09-01 DIAGNOSIS — Z5111 Encounter for antineoplastic chemotherapy: Principal | ICD-10-CM

## 2017-09-01 DIAGNOSIS — C5700 Malignant neoplasm of unspecified fallopian tube: Secondary | ICD-10-CM

## 2017-09-04 ENCOUNTER — Ambulatory Visit: Admit: 2017-09-04 | Discharge: 2017-09-05 | Payer: MEDICARE

## 2017-09-04 DIAGNOSIS — Z5111 Encounter for antineoplastic chemotherapy: Secondary | ICD-10-CM

## 2017-09-04 DIAGNOSIS — C5700 Malignant neoplasm of unspecified fallopian tube: Principal | ICD-10-CM

## 2017-09-05 ENCOUNTER — Ambulatory Visit: Admit: 2017-09-05 | Discharge: 2017-09-05 | Payer: MEDICARE

## 2017-09-05 ENCOUNTER — Ambulatory Visit: Admit: 2017-09-05 | Discharge: 2017-09-05 | Payer: MEDICARE | Attending: Registered" | Primary: Registered"

## 2017-09-05 DIAGNOSIS — Z5111 Encounter for antineoplastic chemotherapy: Secondary | ICD-10-CM

## 2017-09-05 DIAGNOSIS — C5700 Malignant neoplasm of unspecified fallopian tube: Principal | ICD-10-CM

## 2017-09-05 DIAGNOSIS — Z713 Dietary counseling and surveillance: Principal | ICD-10-CM

## 2017-09-19 ENCOUNTER — Ambulatory Visit: Admit: 2017-09-19 | Discharge: 2017-09-19 | Payer: MEDICARE

## 2017-09-19 DIAGNOSIS — C5700 Malignant neoplasm of unspecified fallopian tube: Principal | ICD-10-CM

## 2017-09-19 DIAGNOSIS — Z5111 Encounter for antineoplastic chemotherapy: Secondary | ICD-10-CM

## 2017-09-19 DIAGNOSIS — C5702 Malignant neoplasm of left fallopian tube: Secondary | ICD-10-CM

## 2017-09-26 ENCOUNTER — Ambulatory Visit: Admit: 2017-09-26 | Discharge: 2017-09-26 | Payer: MEDICARE

## 2017-09-26 DIAGNOSIS — Z5111 Encounter for antineoplastic chemotherapy: Secondary | ICD-10-CM

## 2017-09-26 DIAGNOSIS — C5700 Malignant neoplasm of unspecified fallopian tube: Secondary | ICD-10-CM

## 2017-09-29 ENCOUNTER — Ambulatory Visit
Admit: 2017-09-29 | Discharge: 2017-09-30 | Payer: MEDICARE | Attending: Nurse Practitioner | Primary: Nurse Practitioner

## 2017-09-29 DIAGNOSIS — Z5111 Encounter for antineoplastic chemotherapy: Principal | ICD-10-CM

## 2017-09-29 DIAGNOSIS — C5700 Malignant neoplasm of unspecified fallopian tube: Secondary | ICD-10-CM

## 2017-10-17 ENCOUNTER — Ambulatory Visit: Admit: 2017-10-17 | Discharge: 2017-10-17 | Payer: MEDICARE

## 2017-10-17 DIAGNOSIS — F3341 Major depressive disorder, recurrent, in partial remission: Secondary | ICD-10-CM

## 2017-10-17 DIAGNOSIS — D696 Thrombocytopenia, unspecified: Secondary | ICD-10-CM

## 2017-10-17 DIAGNOSIS — R5383 Other fatigue: Principal | ICD-10-CM

## 2017-10-17 DIAGNOSIS — R42 Dizziness and giddiness: Secondary | ICD-10-CM

## 2017-10-17 DIAGNOSIS — D649 Anemia, unspecified: Secondary | ICD-10-CM

## 2017-10-17 DIAGNOSIS — Z79899 Other long term (current) drug therapy: Secondary | ICD-10-CM

## 2017-10-17 DIAGNOSIS — Z5111 Encounter for antineoplastic chemotherapy: Secondary | ICD-10-CM

## 2017-10-17 DIAGNOSIS — C5700 Malignant neoplasm of unspecified fallopian tube: Principal | ICD-10-CM

## 2017-10-20 ENCOUNTER — Ambulatory Visit: Admit: 2017-10-20 | Discharge: 2017-10-21 | Payer: MEDICARE

## 2017-10-20 DIAGNOSIS — D6481 Anemia due to antineoplastic chemotherapy: Secondary | ICD-10-CM

## 2017-10-20 DIAGNOSIS — Z5111 Encounter for antineoplastic chemotherapy: Principal | ICD-10-CM

## 2017-10-20 DIAGNOSIS — T451X5A Adverse effect of antineoplastic and immunosuppressive drugs, initial encounter: Secondary | ICD-10-CM

## 2017-10-20 DIAGNOSIS — C5700 Malignant neoplasm of unspecified fallopian tube: Secondary | ICD-10-CM

## 2017-10-27 ENCOUNTER — Ambulatory Visit: Admit: 2017-10-27 | Discharge: 2017-10-28 | Payer: MEDICARE

## 2017-10-27 DIAGNOSIS — C5700 Malignant neoplasm of unspecified fallopian tube: Principal | ICD-10-CM

## 2017-10-27 DIAGNOSIS — Z5111 Encounter for antineoplastic chemotherapy: Secondary | ICD-10-CM

## 2017-10-28 ENCOUNTER — Ambulatory Visit: Admit: 2017-10-28 | Discharge: 2017-10-29 | Payer: MEDICARE

## 2017-10-28 DIAGNOSIS — C5700 Malignant neoplasm of unspecified fallopian tube: Principal | ICD-10-CM

## 2017-10-28 DIAGNOSIS — Z5111 Encounter for antineoplastic chemotherapy: Secondary | ICD-10-CM

## 2017-11-04 MED ORDER — DOCUSATE SODIUM 100 MG CAPSULE
ORAL_CAPSULE | Freq: Two times a day (BID) | ORAL | 1 refills | 0 days | Status: CP
Start: 2017-11-04 — End: 2017-12-04

## 2017-11-04 MED ORDER — GLYCERIN (ADULT) RECTAL SUPPOSITORY
Freq: Every day | RECTAL | 0 refills | 0 days | Status: CP | PRN
Start: 2017-11-04 — End: 2017-11-08

## 2017-11-14 ENCOUNTER — Ambulatory Visit: Admit: 2017-11-14 | Discharge: 2017-11-15 | Payer: MEDICARE

## 2017-11-14 DIAGNOSIS — C5700 Malignant neoplasm of unspecified fallopian tube: Principal | ICD-10-CM

## 2017-11-14 DIAGNOSIS — Z5111 Encounter for antineoplastic chemotherapy: Secondary | ICD-10-CM

## 2017-11-17 ENCOUNTER — Ambulatory Visit: Admit: 2017-11-17 | Discharge: 2017-11-17 | Payer: MEDICARE

## 2017-11-17 ENCOUNTER — Ambulatory Visit
Admit: 2017-11-17 | Discharge: 2017-11-17 | Payer: MEDICARE | Attending: Gynecologic Oncology | Primary: Gynecologic Oncology

## 2017-11-17 DIAGNOSIS — C5700 Malignant neoplasm of unspecified fallopian tube: Principal | ICD-10-CM

## 2017-11-17 DIAGNOSIS — Z5111 Encounter for antineoplastic chemotherapy: Principal | ICD-10-CM

## 2017-11-19 ENCOUNTER — Ambulatory Visit: Admit: 2017-11-19 | Discharge: 2017-11-19 | Payer: MEDICARE

## 2017-11-19 DIAGNOSIS — J029 Acute pharyngitis, unspecified: Principal | ICD-10-CM

## 2017-11-19 DIAGNOSIS — R0981 Nasal congestion: Secondary | ICD-10-CM

## 2017-12-08 ENCOUNTER — Ambulatory Visit: Admit: 2017-12-08 | Discharge: 2017-12-08 | Payer: MEDICARE

## 2017-12-08 DIAGNOSIS — Z5111 Encounter for antineoplastic chemotherapy: Secondary | ICD-10-CM

## 2017-12-08 DIAGNOSIS — C5702 Malignant neoplasm of left fallopian tube: Principal | ICD-10-CM

## 2017-12-08 DIAGNOSIS — C5700 Malignant neoplasm of unspecified fallopian tube: Secondary | ICD-10-CM

## 2017-12-09 ENCOUNTER — Ambulatory Visit: Admit: 2017-12-09 | Discharge: 2017-12-10 | Payer: MEDICARE

## 2017-12-09 DIAGNOSIS — M810 Age-related osteoporosis without current pathological fracture: Principal | ICD-10-CM

## 2017-12-09 MED ORDER — ALENDRONATE 70 MG TABLET
ORAL_TABLET | ORAL | 3 refills | 0 days | Status: CP
Start: 2017-12-09 — End: ?

## 2017-12-26 ENCOUNTER — Ambulatory Visit: Admit: 2017-12-26 | Discharge: 2017-12-27 | Payer: MEDICARE

## 2017-12-26 ENCOUNTER — Ambulatory Visit: Admit: 2017-12-26 | Discharge: 2017-12-27 | Payer: MEDICARE | Attending: MS" | Primary: MS"

## 2017-12-26 DIAGNOSIS — C5702 Malignant neoplasm of left fallopian tube: Principal | ICD-10-CM

## 2017-12-26 DIAGNOSIS — C5700 Malignant neoplasm of unspecified fallopian tube: Secondary | ICD-10-CM

## 2018-01-15 ENCOUNTER — Ambulatory Visit: Admit: 2018-01-15 | Discharge: 2018-01-16 | Payer: MEDICARE

## 2018-01-15 DIAGNOSIS — M810 Age-related osteoporosis without current pathological fracture: Principal | ICD-10-CM

## 2018-01-16 ENCOUNTER — Ambulatory Visit: Admit: 2018-01-16 | Discharge: 2018-01-17 | Payer: MEDICARE | Attending: MS" | Primary: MS"

## 2018-01-16 DIAGNOSIS — Z800 Family history of malignant neoplasm of digestive organs: Secondary | ICD-10-CM

## 2018-01-16 DIAGNOSIS — C5700 Malignant neoplasm of unspecified fallopian tube: Principal | ICD-10-CM

## 2018-01-16 DIAGNOSIS — Z8042 Family history of malignant neoplasm of prostate: Secondary | ICD-10-CM

## 2018-01-19 ENCOUNTER — Ambulatory Visit: Admit: 2018-01-19 | Discharge: 2018-01-19 | Payer: MEDICARE

## 2018-01-19 ENCOUNTER — Ambulatory Visit
Admit: 2018-01-19 | Discharge: 2018-01-19 | Payer: MEDICARE | Attending: Gynecologic Oncology | Primary: Gynecologic Oncology

## 2018-01-19 DIAGNOSIS — C5700 Malignant neoplasm of unspecified fallopian tube: Principal | ICD-10-CM

## 2018-01-19 DIAGNOSIS — Z5111 Encounter for antineoplastic chemotherapy: Secondary | ICD-10-CM

## 2018-01-20 ENCOUNTER — Ambulatory Visit: Admit: 2018-01-20 | Discharge: 2018-01-21 | Payer: MEDICARE

## 2018-01-20 DIAGNOSIS — Z79899 Other long term (current) drug therapy: Secondary | ICD-10-CM

## 2018-01-20 DIAGNOSIS — I10 Essential (primary) hypertension: Principal | ICD-10-CM

## 2018-03-02 MED ORDER — OXYBUTYNIN CHLORIDE 5 MG TABLET
ORAL_TABLET | 1 refills | 0 days | Status: CP
Start: 2018-03-02 — End: 2018-09-29

## 2018-03-02 MED ORDER — LISINOPRIL 20 MG TABLET
ORAL_TABLET | 0 refills | 0 days | Status: CP
Start: 2018-03-02 — End: 2018-05-31

## 2018-03-17 ENCOUNTER — Ambulatory Visit: Admit: 2018-03-17 | Discharge: 2018-03-18 | Payer: MEDICARE

## 2018-03-17 DIAGNOSIS — M810 Age-related osteoporosis without current pathological fracture: Principal | ICD-10-CM

## 2018-03-17 DIAGNOSIS — R2689 Other abnormalities of gait and mobility: Secondary | ICD-10-CM

## 2018-03-20 ENCOUNTER — Ambulatory Visit
Admit: 2018-03-20 | Discharge: 2018-04-18 | Payer: MEDICARE | Attending: Rehabilitative and Restorative Service Providers" | Primary: Rehabilitative and Restorative Service Providers"

## 2018-03-20 DIAGNOSIS — R2689 Other abnormalities of gait and mobility: Secondary | ICD-10-CM

## 2018-03-20 DIAGNOSIS — G8929 Other chronic pain: Secondary | ICD-10-CM

## 2018-03-20 DIAGNOSIS — Z9181 History of falling: Secondary | ICD-10-CM

## 2018-03-20 DIAGNOSIS — M25561 Pain in right knee: Principal | ICD-10-CM

## 2018-03-20 DIAGNOSIS — M6281 Muscle weakness (generalized): Secondary | ICD-10-CM

## 2018-03-23 ENCOUNTER — Ambulatory Visit
Admit: 2018-03-23 | Discharge: 2018-03-23 | Payer: MEDICARE | Attending: Gynecologic Oncology | Primary: Gynecologic Oncology

## 2018-03-23 ENCOUNTER — Ambulatory Visit: Admit: 2018-03-23 | Discharge: 2018-03-23 | Payer: MEDICARE

## 2018-03-23 DIAGNOSIS — Z5111 Encounter for antineoplastic chemotherapy: Secondary | ICD-10-CM

## 2018-03-23 DIAGNOSIS — C5700 Malignant neoplasm of unspecified fallopian tube: Principal | ICD-10-CM

## 2018-03-25 DIAGNOSIS — Z9181 History of falling: Secondary | ICD-10-CM

## 2018-03-25 DIAGNOSIS — R2689 Other abnormalities of gait and mobility: Principal | ICD-10-CM

## 2018-04-03 DIAGNOSIS — R2689 Other abnormalities of gait and mobility: Secondary | ICD-10-CM

## 2018-04-03 DIAGNOSIS — M25561 Pain in right knee: Secondary | ICD-10-CM

## 2018-04-03 DIAGNOSIS — G8929 Other chronic pain: Secondary | ICD-10-CM

## 2018-04-03 DIAGNOSIS — Z9181 History of falling: Secondary | ICD-10-CM

## 2018-04-03 DIAGNOSIS — M6281 Muscle weakness (generalized): Secondary | ICD-10-CM

## 2018-04-09 DIAGNOSIS — R2689 Other abnormalities of gait and mobility: Principal | ICD-10-CM

## 2018-04-09 DIAGNOSIS — Z9181 History of falling: Secondary | ICD-10-CM

## 2018-04-09 DIAGNOSIS — M6281 Muscle weakness (generalized): Secondary | ICD-10-CM

## 2018-04-10 DIAGNOSIS — M6281 Muscle weakness (generalized): Secondary | ICD-10-CM

## 2018-04-10 DIAGNOSIS — Z9181 History of falling: Secondary | ICD-10-CM

## 2018-04-10 DIAGNOSIS — R2689 Other abnormalities of gait and mobility: Principal | ICD-10-CM

## 2018-04-22 ENCOUNTER — Ambulatory Visit
Admit: 2018-04-22 | Discharge: 2018-05-18 | Payer: MEDICARE | Attending: Rehabilitative and Restorative Service Providers" | Primary: Rehabilitative and Restorative Service Providers"

## 2018-04-22 DIAGNOSIS — R2689 Other abnormalities of gait and mobility: Secondary | ICD-10-CM

## 2018-04-22 DIAGNOSIS — G8929 Other chronic pain: Secondary | ICD-10-CM

## 2018-04-22 DIAGNOSIS — M6281 Muscle weakness (generalized): Secondary | ICD-10-CM

## 2018-04-22 DIAGNOSIS — M25561 Pain in right knee: Secondary | ICD-10-CM

## 2018-04-22 DIAGNOSIS — Z9181 History of falling: Secondary | ICD-10-CM

## 2018-04-24 DIAGNOSIS — M6281 Muscle weakness (generalized): Secondary | ICD-10-CM

## 2018-04-24 DIAGNOSIS — M25561 Pain in right knee: Secondary | ICD-10-CM

## 2018-04-24 DIAGNOSIS — G8929 Other chronic pain: Secondary | ICD-10-CM

## 2018-04-24 DIAGNOSIS — R2689 Other abnormalities of gait and mobility: Secondary | ICD-10-CM

## 2018-04-24 DIAGNOSIS — Z9181 History of falling: Secondary | ICD-10-CM

## 2018-04-29 DIAGNOSIS — Z9181 History of falling: Secondary | ICD-10-CM

## 2018-04-29 DIAGNOSIS — M6281 Muscle weakness (generalized): Secondary | ICD-10-CM

## 2018-04-29 DIAGNOSIS — R2689 Other abnormalities of gait and mobility: Secondary | ICD-10-CM

## 2018-04-29 DIAGNOSIS — G8929 Other chronic pain: Secondary | ICD-10-CM

## 2018-04-29 DIAGNOSIS — M25561 Pain in right knee: Secondary | ICD-10-CM

## 2018-05-01 DIAGNOSIS — R2689 Other abnormalities of gait and mobility: Secondary | ICD-10-CM

## 2018-05-01 DIAGNOSIS — M6281 Muscle weakness (generalized): Secondary | ICD-10-CM

## 2018-05-01 DIAGNOSIS — G8929 Other chronic pain: Secondary | ICD-10-CM

## 2018-05-01 DIAGNOSIS — M25561 Pain in right knee: Secondary | ICD-10-CM

## 2018-05-01 DIAGNOSIS — Z9181 History of falling: Secondary | ICD-10-CM

## 2018-05-06 DIAGNOSIS — M25561 Pain in right knee: Secondary | ICD-10-CM

## 2018-05-06 DIAGNOSIS — G8929 Other chronic pain: Secondary | ICD-10-CM

## 2018-05-06 DIAGNOSIS — M6281 Muscle weakness (generalized): Secondary | ICD-10-CM

## 2018-05-06 DIAGNOSIS — Z9181 History of falling: Secondary | ICD-10-CM

## 2018-05-06 DIAGNOSIS — R2689 Other abnormalities of gait and mobility: Secondary | ICD-10-CM

## 2018-05-13 DIAGNOSIS — R2689 Other abnormalities of gait and mobility: Principal | ICD-10-CM

## 2018-05-13 DIAGNOSIS — M6281 Muscle weakness (generalized): Secondary | ICD-10-CM

## 2018-05-13 DIAGNOSIS — Z9181 History of falling: Secondary | ICD-10-CM

## 2018-05-15 DIAGNOSIS — M25561 Pain in right knee: Secondary | ICD-10-CM

## 2018-05-15 DIAGNOSIS — G8929 Other chronic pain: Secondary | ICD-10-CM

## 2018-05-15 DIAGNOSIS — Z9181 History of falling: Secondary | ICD-10-CM

## 2018-05-15 DIAGNOSIS — M6281 Muscle weakness (generalized): Secondary | ICD-10-CM

## 2018-05-15 DIAGNOSIS — R2689 Other abnormalities of gait and mobility: Secondary | ICD-10-CM

## 2018-05-20 ENCOUNTER — Ambulatory Visit
Admit: 2018-05-20 | Discharge: 2018-06-17 | Payer: MEDICARE | Attending: Rehabilitative and Restorative Service Providers" | Primary: Rehabilitative and Restorative Service Providers"

## 2018-05-20 DIAGNOSIS — G8929 Other chronic pain: Secondary | ICD-10-CM

## 2018-05-20 DIAGNOSIS — Z9181 History of falling: Secondary | ICD-10-CM

## 2018-05-20 DIAGNOSIS — R2689 Other abnormalities of gait and mobility: Secondary | ICD-10-CM

## 2018-05-20 DIAGNOSIS — M6281 Muscle weakness (generalized): Secondary | ICD-10-CM

## 2018-05-20 DIAGNOSIS — M25561 Pain in right knee: Secondary | ICD-10-CM

## 2018-05-22 DIAGNOSIS — M6281 Muscle weakness (generalized): Secondary | ICD-10-CM

## 2018-05-22 DIAGNOSIS — Z9181 History of falling: Secondary | ICD-10-CM

## 2018-05-22 DIAGNOSIS — R2689 Other abnormalities of gait and mobility: Secondary | ICD-10-CM

## 2018-05-22 DIAGNOSIS — M25561 Pain in right knee: Secondary | ICD-10-CM

## 2018-05-22 DIAGNOSIS — G8929 Other chronic pain: Secondary | ICD-10-CM

## 2018-05-27 DIAGNOSIS — M25561 Pain in right knee: Secondary | ICD-10-CM

## 2018-05-27 DIAGNOSIS — G8929 Other chronic pain: Secondary | ICD-10-CM

## 2018-05-27 DIAGNOSIS — R2689 Other abnormalities of gait and mobility: Secondary | ICD-10-CM

## 2018-05-27 DIAGNOSIS — Z9181 History of falling: Secondary | ICD-10-CM

## 2018-05-27 DIAGNOSIS — M6281 Muscle weakness (generalized): Secondary | ICD-10-CM

## 2018-05-28 ENCOUNTER — Ambulatory Visit: Admit: 2018-05-28 | Discharge: 2018-05-29 | Payer: MEDICARE

## 2018-05-28 DIAGNOSIS — Z79899 Other long term (current) drug therapy: Secondary | ICD-10-CM

## 2018-05-28 DIAGNOSIS — C5700 Malignant neoplasm of unspecified fallopian tube: Principal | ICD-10-CM

## 2018-05-28 DIAGNOSIS — F3341 Major depressive disorder, recurrent, in partial remission: Secondary | ICD-10-CM

## 2018-05-28 DIAGNOSIS — N3941 Urge incontinence: Secondary | ICD-10-CM

## 2018-05-29 DIAGNOSIS — G8929 Other chronic pain: Secondary | ICD-10-CM

## 2018-05-29 DIAGNOSIS — Z9181 History of falling: Secondary | ICD-10-CM

## 2018-05-29 DIAGNOSIS — M6281 Muscle weakness (generalized): Secondary | ICD-10-CM

## 2018-05-29 DIAGNOSIS — R2689 Other abnormalities of gait and mobility: Secondary | ICD-10-CM

## 2018-05-29 DIAGNOSIS — M25561 Pain in right knee: Secondary | ICD-10-CM

## 2018-05-31 MED ORDER — LISINOPRIL 20 MG TABLET
1 refills | 0 days | Status: CP
Start: 2018-05-31 — End: 2018-12-14

## 2018-06-02 DIAGNOSIS — M6281 Muscle weakness (generalized): Secondary | ICD-10-CM

## 2018-06-02 DIAGNOSIS — R2689 Other abnormalities of gait and mobility: Secondary | ICD-10-CM

## 2018-06-02 DIAGNOSIS — Z9181 History of falling: Secondary | ICD-10-CM

## 2018-06-08 ENCOUNTER — Ambulatory Visit: Admit: 2018-06-08 | Discharge: 2018-06-08 | Payer: MEDICARE

## 2018-06-08 ENCOUNTER — Ambulatory Visit
Admit: 2018-06-08 | Discharge: 2018-06-08 | Payer: MEDICARE | Attending: Gynecologic Oncology | Primary: Gynecologic Oncology

## 2018-06-08 DIAGNOSIS — Z5111 Encounter for antineoplastic chemotherapy: Secondary | ICD-10-CM

## 2018-06-08 DIAGNOSIS — C5700 Malignant neoplasm of unspecified fallopian tube: Principal | ICD-10-CM

## 2018-06-09 DIAGNOSIS — Z9181 History of falling: Secondary | ICD-10-CM

## 2018-06-09 DIAGNOSIS — R2689 Other abnormalities of gait and mobility: Secondary | ICD-10-CM

## 2018-06-16 DIAGNOSIS — Z9181 History of falling: Principal | ICD-10-CM

## 2018-06-16 DIAGNOSIS — G4710 Hypersomnia, unspecified: Principal | ICD-10-CM

## 2018-06-16 DIAGNOSIS — F909 Attention-deficit hyperactivity disorder, unspecified type: Principal | ICD-10-CM

## 2018-06-16 DIAGNOSIS — F609 Personality disorder, unspecified: Principal | ICD-10-CM

## 2018-06-16 DIAGNOSIS — Z9660 Presence of unspecified orthopedic joint implant: Principal | ICD-10-CM

## 2018-06-16 DIAGNOSIS — S83209A Unspecified tear of unspecified meniscus, current injury, unspecified knee, initial encounter: Principal | ICD-10-CM

## 2018-06-16 DIAGNOSIS — T07XXXA Unspecified multiple injuries, initial encounter: Principal | ICD-10-CM

## 2018-06-16 DIAGNOSIS — M810 Age-related osteoporosis without current pathological fracture: Principal | ICD-10-CM

## 2018-06-16 DIAGNOSIS — G8929 Other chronic pain: Principal | ICD-10-CM

## 2018-06-16 DIAGNOSIS — K5900 Constipation, unspecified: Principal | ICD-10-CM

## 2018-06-16 DIAGNOSIS — Z882 Allergy status to sulfonamides status: Principal | ICD-10-CM

## 2018-06-16 DIAGNOSIS — I10 Essential (primary) hypertension: Principal | ICD-10-CM

## 2018-06-16 DIAGNOSIS — M25561 Pain in right knee: Principal | ICD-10-CM

## 2018-06-16 DIAGNOSIS — R2689 Other abnormalities of gait and mobility: Principal | ICD-10-CM

## 2018-06-16 DIAGNOSIS — Z7983 Long term (current) use of bisphosphonates: Principal | ICD-10-CM

## 2018-06-16 DIAGNOSIS — S8990XA Unspecified injury of unspecified lower leg, initial encounter: Principal | ICD-10-CM

## 2018-06-16 DIAGNOSIS — F329 Major depressive disorder, single episode, unspecified: Principal | ICD-10-CM

## 2018-06-16 DIAGNOSIS — F419 Anxiety disorder, unspecified: Principal | ICD-10-CM

## 2018-06-16 DIAGNOSIS — Z885 Allergy status to narcotic agent status: Principal | ICD-10-CM

## 2018-06-16 DIAGNOSIS — M2550 Pain in unspecified joint: Principal | ICD-10-CM

## 2018-06-16 DIAGNOSIS — G4761 Periodic limb movement disorder: Principal | ICD-10-CM

## 2018-06-16 DIAGNOSIS — Z79899 Other long term (current) drug therapy: Principal | ICD-10-CM

## 2018-06-16 DIAGNOSIS — F603 Borderline personality disorder: Principal | ICD-10-CM

## 2018-06-16 DIAGNOSIS — Z818 Family history of other mental and behavioral disorders: Principal | ICD-10-CM

## 2018-06-16 DIAGNOSIS — H832X9 Labyrinthine dysfunction, unspecified ear: Principal | ICD-10-CM

## 2018-06-16 DIAGNOSIS — G4733 Obstructive sleep apnea (adult) (pediatric): Principal | ICD-10-CM

## 2018-06-16 DIAGNOSIS — G4700 Insomnia, unspecified: Principal | ICD-10-CM

## 2018-06-16 DIAGNOSIS — N3941 Urge incontinence: Principal | ICD-10-CM

## 2018-06-16 DIAGNOSIS — M1990 Unspecified osteoarthritis, unspecified site: Principal | ICD-10-CM

## 2018-06-16 DIAGNOSIS — Z8543 Personal history of malignant neoplasm of ovary: Principal | ICD-10-CM

## 2018-06-16 DIAGNOSIS — M7120 Synovial cyst of popliteal space [Baker], unspecified knee: Principal | ICD-10-CM

## 2018-06-16 DIAGNOSIS — C569 Malignant neoplasm of unspecified ovary: Principal | ICD-10-CM

## 2018-06-16 DIAGNOSIS — R29898 Other symptoms and signs involving the musculoskeletal system: Principal | ICD-10-CM

## 2018-06-16 DIAGNOSIS — K589 Irritable bowel syndrome without diarrhea: Principal | ICD-10-CM

## 2018-06-16 DIAGNOSIS — M6281 Muscle weakness (generalized): Principal | ICD-10-CM

## 2018-06-26 ENCOUNTER — Ambulatory Visit
Admit: 2018-06-26 | Discharge: 2018-07-17 | Payer: MEDICARE | Attending: Rehabilitative and Restorative Service Providers" | Primary: Rehabilitative and Restorative Service Providers"

## 2018-06-26 DIAGNOSIS — Z9660 Presence of unspecified orthopedic joint implant: Principal | ICD-10-CM

## 2018-06-26 DIAGNOSIS — Z7983 Long term (current) use of bisphosphonates: Principal | ICD-10-CM

## 2018-06-26 DIAGNOSIS — M25561 Pain in right knee: Principal | ICD-10-CM

## 2018-06-26 DIAGNOSIS — N3941 Urge incontinence: Principal | ICD-10-CM

## 2018-06-26 DIAGNOSIS — S8990XA Unspecified injury of unspecified lower leg, initial encounter: Principal | ICD-10-CM

## 2018-06-26 DIAGNOSIS — G4733 Obstructive sleep apnea (adult) (pediatric): Principal | ICD-10-CM

## 2018-06-26 DIAGNOSIS — F603 Borderline personality disorder: Principal | ICD-10-CM

## 2018-06-26 DIAGNOSIS — F329 Major depressive disorder, single episode, unspecified: Principal | ICD-10-CM

## 2018-06-26 DIAGNOSIS — F419 Anxiety disorder, unspecified: Principal | ICD-10-CM

## 2018-06-26 DIAGNOSIS — Z818 Family history of other mental and behavioral disorders: Principal | ICD-10-CM

## 2018-06-26 DIAGNOSIS — M2550 Pain in unspecified joint: Principal | ICD-10-CM

## 2018-06-26 DIAGNOSIS — F909 Attention-deficit hyperactivity disorder, unspecified type: Principal | ICD-10-CM

## 2018-06-26 DIAGNOSIS — G4700 Insomnia, unspecified: Principal | ICD-10-CM

## 2018-06-26 DIAGNOSIS — Z882 Allergy status to sulfonamides status: Principal | ICD-10-CM

## 2018-06-26 DIAGNOSIS — S83209A Unspecified tear of unspecified meniscus, current injury, unspecified knee, initial encounter: Principal | ICD-10-CM

## 2018-06-26 DIAGNOSIS — G4710 Hypersomnia, unspecified: Principal | ICD-10-CM

## 2018-06-26 DIAGNOSIS — Z9181 History of falling: Principal | ICD-10-CM

## 2018-06-26 DIAGNOSIS — M1990 Unspecified osteoarthritis, unspecified site: Principal | ICD-10-CM

## 2018-06-26 DIAGNOSIS — M6281 Muscle weakness (generalized): Principal | ICD-10-CM

## 2018-06-26 DIAGNOSIS — Z885 Allergy status to narcotic agent status: Principal | ICD-10-CM

## 2018-06-26 DIAGNOSIS — M7120 Synovial cyst of popliteal space [Baker], unspecified knee: Principal | ICD-10-CM

## 2018-06-26 DIAGNOSIS — M810 Age-related osteoporosis without current pathological fracture: Principal | ICD-10-CM

## 2018-06-26 DIAGNOSIS — R29898 Other symptoms and signs involving the musculoskeletal system: Principal | ICD-10-CM

## 2018-06-26 DIAGNOSIS — Z79899 Other long term (current) drug therapy: Principal | ICD-10-CM

## 2018-06-26 DIAGNOSIS — T07XXXA Unspecified multiple injuries, initial encounter: Principal | ICD-10-CM

## 2018-06-26 DIAGNOSIS — G4761 Periodic limb movement disorder: Principal | ICD-10-CM

## 2018-06-26 DIAGNOSIS — I10 Essential (primary) hypertension: Principal | ICD-10-CM

## 2018-06-26 DIAGNOSIS — F609 Personality disorder, unspecified: Principal | ICD-10-CM

## 2018-06-26 DIAGNOSIS — Z8543 Personal history of malignant neoplasm of ovary: Principal | ICD-10-CM

## 2018-06-26 DIAGNOSIS — G8929 Other chronic pain: Principal | ICD-10-CM

## 2018-06-26 DIAGNOSIS — R2689 Other abnormalities of gait and mobility: Principal | ICD-10-CM

## 2018-06-26 DIAGNOSIS — C569 Malignant neoplasm of unspecified ovary: Principal | ICD-10-CM

## 2018-06-26 DIAGNOSIS — H832X9 Labyrinthine dysfunction, unspecified ear: Principal | ICD-10-CM

## 2018-06-26 DIAGNOSIS — K5900 Constipation, unspecified: Principal | ICD-10-CM

## 2018-06-26 DIAGNOSIS — K589 Irritable bowel syndrome without diarrhea: Principal | ICD-10-CM

## 2018-08-17 ENCOUNTER — Ambulatory Visit: Admit: 2018-08-17 | Discharge: 2018-08-17 | Payer: MEDICARE

## 2018-08-17 ENCOUNTER — Ambulatory Visit
Admit: 2018-08-17 | Discharge: 2018-08-17 | Payer: MEDICARE | Attending: Gynecologic Oncology | Primary: Gynecologic Oncology

## 2018-08-17 DIAGNOSIS — C5700 Malignant neoplasm of unspecified fallopian tube: Principal | ICD-10-CM

## 2018-08-17 DIAGNOSIS — Z5111 Encounter for antineoplastic chemotherapy: Secondary | ICD-10-CM

## 2018-08-25 ENCOUNTER — Ambulatory Visit: Admit: 2018-08-25 | Discharge: 2018-08-26 | Payer: MEDICARE

## 2018-08-25 DIAGNOSIS — C5700 Malignant neoplasm of unspecified fallopian tube: Secondary | ICD-10-CM

## 2018-08-25 DIAGNOSIS — C5702 Malignant neoplasm of left fallopian tube: Principal | ICD-10-CM

## 2018-09-01 ENCOUNTER — Ambulatory Visit: Admit: 2018-09-01 | Discharge: 2018-09-14 | Payer: MEDICARE

## 2018-09-01 DIAGNOSIS — C5700 Malignant neoplasm of unspecified fallopian tube: Principal | ICD-10-CM

## 2018-09-01 DIAGNOSIS — Z5111 Encounter for antineoplastic chemotherapy: Secondary | ICD-10-CM

## 2018-09-03 MED ORDER — ONDANSETRON HCL 8 MG TABLET
ORAL_TABLET | Freq: Three times a day (TID) | ORAL | 2 refills | 0.00000 days | Status: CP | PRN
Start: 2018-09-03 — End: ?

## 2018-09-03 MED ORDER — PROCHLORPERAZINE MALEATE 10 MG TABLET
ORAL_TABLET | Freq: Four times a day (QID) | ORAL | 2 refills | 0 days | Status: CP | PRN
Start: 2018-09-03 — End: ?

## 2018-09-04 ENCOUNTER — Ambulatory Visit: Admit: 2018-09-04 | Discharge: 2018-09-05 | Payer: MEDICARE

## 2018-09-04 ENCOUNTER — Other Ambulatory Visit: Admit: 2018-09-04 | Discharge: 2018-09-05 | Payer: MEDICARE

## 2018-09-04 DIAGNOSIS — Z5111 Encounter for antineoplastic chemotherapy: Principal | ICD-10-CM

## 2018-09-04 DIAGNOSIS — C5700 Malignant neoplasm of unspecified fallopian tube: Secondary | ICD-10-CM

## 2018-09-29 MED ORDER — OXYBUTYNIN CHLORIDE 5 MG TABLET
ORAL_TABLET | 0 refills | 0 days | Status: CP
Start: 2018-09-29 — End: ?

## 2018-09-29 MED ORDER — ATORVASTATIN 10 MG TABLET
ORAL_TABLET | 3 refills | 0 days | Status: CP
Start: 2018-09-29 — End: ?

## 2018-10-02 ENCOUNTER — Ambulatory Visit: Admit: 2018-10-02 | Discharge: 2018-10-03 | Payer: MEDICARE

## 2018-10-02 DIAGNOSIS — Z5111 Encounter for antineoplastic chemotherapy: Secondary | ICD-10-CM

## 2018-10-02 DIAGNOSIS — C5700 Malignant neoplasm of unspecified fallopian tube: Principal | ICD-10-CM

## 2018-10-05 ENCOUNTER — Ambulatory Visit: Admit: 2018-10-05 | Discharge: 2018-10-05 | Payer: MEDICARE

## 2018-10-05 DIAGNOSIS — Z5111 Encounter for antineoplastic chemotherapy: Principal | ICD-10-CM

## 2018-10-05 DIAGNOSIS — C5700 Malignant neoplasm of unspecified fallopian tube: Secondary | ICD-10-CM

## 2018-10-07 ENCOUNTER — Ambulatory Visit: Admit: 2018-10-07 | Discharge: 2018-10-08 | Payer: MEDICARE

## 2018-10-07 DIAGNOSIS — C5700 Malignant neoplasm of unspecified fallopian tube: Secondary | ICD-10-CM

## 2018-10-07 DIAGNOSIS — Z5111 Encounter for antineoplastic chemotherapy: Principal | ICD-10-CM

## 2018-10-30 ENCOUNTER — Ambulatory Visit: Admit: 2018-10-30 | Discharge: 2018-10-31 | Payer: MEDICARE

## 2018-10-30 DIAGNOSIS — C5700 Malignant neoplasm of unspecified fallopian tube: Principal | ICD-10-CM

## 2018-10-30 DIAGNOSIS — Z5111 Encounter for antineoplastic chemotherapy: Secondary | ICD-10-CM

## 2018-11-02 ENCOUNTER — Ambulatory Visit
Admit: 2018-11-02 | Discharge: 2018-11-02 | Payer: MEDICARE | Attending: Gynecologic Oncology | Primary: Gynecologic Oncology

## 2018-11-02 ENCOUNTER — Ambulatory Visit: Admit: 2018-11-02 | Discharge: 2018-11-02 | Payer: MEDICARE

## 2018-11-02 DIAGNOSIS — Z5111 Encounter for antineoplastic chemotherapy: Principal | ICD-10-CM

## 2018-11-02 DIAGNOSIS — C5700 Malignant neoplasm of unspecified fallopian tube: Secondary | ICD-10-CM

## 2018-11-04 ENCOUNTER — Ambulatory Visit: Admit: 2018-11-04 | Discharge: 2018-11-04 | Payer: MEDICARE

## 2018-11-04 DIAGNOSIS — C5700 Malignant neoplasm of unspecified fallopian tube: Secondary | ICD-10-CM

## 2018-11-04 DIAGNOSIS — Z5111 Encounter for antineoplastic chemotherapy: Principal | ICD-10-CM

## 2018-11-30 ENCOUNTER — Ambulatory Visit: Admit: 2018-11-30 | Discharge: 2018-11-30 | Payer: MEDICARE

## 2018-11-30 DIAGNOSIS — C5702 Malignant neoplasm of left fallopian tube: Secondary | ICD-10-CM

## 2018-11-30 DIAGNOSIS — Z5111 Encounter for antineoplastic chemotherapy: Secondary | ICD-10-CM

## 2018-11-30 DIAGNOSIS — C5700 Malignant neoplasm of unspecified fallopian tube: Principal | ICD-10-CM

## 2018-12-08 ENCOUNTER — Ambulatory Visit: Admit: 2018-12-08 | Discharge: 2018-12-08 | Payer: MEDICARE

## 2018-12-08 DIAGNOSIS — Z5111 Encounter for antineoplastic chemotherapy: Secondary | ICD-10-CM

## 2018-12-08 DIAGNOSIS — C5700 Malignant neoplasm of unspecified fallopian tube: Principal | ICD-10-CM

## 2018-12-09 ENCOUNTER — Ambulatory Visit: Admit: 2018-12-09 | Discharge: 2018-12-10 | Payer: MEDICARE

## 2018-12-09 DIAGNOSIS — Z5111 Encounter for antineoplastic chemotherapy: Principal | ICD-10-CM

## 2018-12-09 DIAGNOSIS — C5700 Malignant neoplasm of unspecified fallopian tube: Secondary | ICD-10-CM

## 2018-12-14 MED ORDER — LISINOPRIL 20 MG TABLET
ORAL_TABLET | 0 refills | 0 days | Status: CP
Start: 2018-12-14 — End: ?

## 2018-12-17 ENCOUNTER — Ambulatory Visit: Admit: 2018-12-17 | Discharge: 2018-12-18 | Payer: MEDICARE

## 2018-12-17 DIAGNOSIS — C5700 Malignant neoplasm of unspecified fallopian tube: Secondary | ICD-10-CM

## 2018-12-17 DIAGNOSIS — R296 Repeated falls: Secondary | ICD-10-CM

## 2018-12-17 DIAGNOSIS — T451X5A Adverse effect of antineoplastic and immunosuppressive drugs, initial encounter: Secondary | ICD-10-CM

## 2018-12-17 DIAGNOSIS — Z79899 Other long term (current) drug therapy: Secondary | ICD-10-CM

## 2018-12-17 DIAGNOSIS — I10 Essential (primary) hypertension: Secondary | ICD-10-CM

## 2018-12-17 DIAGNOSIS — F330 Major depressive disorder, recurrent, mild: Secondary | ICD-10-CM

## 2018-12-17 DIAGNOSIS — D6481 Anemia due to antineoplastic chemotherapy: Secondary | ICD-10-CM

## 2018-12-17 DIAGNOSIS — R634 Abnormal weight loss: Secondary | ICD-10-CM

## 2018-12-30 DIAGNOSIS — Z5111 Encounter for antineoplastic chemotherapy: Secondary | ICD-10-CM

## 2018-12-30 DIAGNOSIS — C5700 Malignant neoplasm of unspecified fallopian tube: Secondary | ICD-10-CM

## 2019-01-04 ENCOUNTER — Ambulatory Visit: Admit: 2019-01-04 | Discharge: 2019-01-05 | Payer: MEDICARE

## 2019-01-04 DIAGNOSIS — Z5111 Encounter for antineoplastic chemotherapy: Secondary | ICD-10-CM

## 2019-01-04 DIAGNOSIS — C5700 Malignant neoplasm of unspecified fallopian tube: Secondary | ICD-10-CM

## 2019-01-13 ENCOUNTER — Ambulatory Visit: Admit: 2019-01-13 | Discharge: 2019-01-14 | Payer: MEDICARE

## 2019-01-13 DIAGNOSIS — Z5111 Encounter for antineoplastic chemotherapy: Secondary | ICD-10-CM

## 2019-01-13 DIAGNOSIS — C5700 Malignant neoplasm of unspecified fallopian tube: Secondary | ICD-10-CM

## 2019-01-14 ENCOUNTER — Ambulatory Visit: Admit: 2019-01-14 | Discharge: 2019-01-15 | Payer: MEDICARE

## 2019-01-14 DIAGNOSIS — C5700 Malignant neoplasm of unspecified fallopian tube: Secondary | ICD-10-CM

## 2019-01-14 DIAGNOSIS — Z5111 Encounter for antineoplastic chemotherapy: Secondary | ICD-10-CM

## 2019-01-15 DIAGNOSIS — C5702 Malignant neoplasm of left fallopian tube: Secondary | ICD-10-CM

## 2019-01-15 DIAGNOSIS — Z5111 Encounter for antineoplastic chemotherapy: Secondary | ICD-10-CM

## 2019-01-16 ENCOUNTER — Ambulatory Visit: Admit: 2019-01-16 | Discharge: 2019-01-17 | Payer: MEDICARE

## 2019-01-16 DIAGNOSIS — C5700 Malignant neoplasm of unspecified fallopian tube: Secondary | ICD-10-CM

## 2019-01-16 DIAGNOSIS — Z5111 Encounter for antineoplastic chemotherapy: Secondary | ICD-10-CM

## 2019-01-25 MED ORDER — OXYBUTYNIN CHLORIDE 5 MG TABLET: 5 mg | tablet | Freq: Four times a day (QID) | 0 refills | 90 days | Status: AC

## 2019-02-05 MED ORDER — ALENDRONATE 70 MG TABLET: 70 mg | tablet | 3 refills | 84 days | Status: AC

## 2019-02-12 ENCOUNTER — Ambulatory Visit: Admit: 2019-02-12 | Discharge: 2019-02-13 | Payer: MEDICARE

## 2019-02-15 ENCOUNTER — Ambulatory Visit: Admit: 2019-02-15 | Discharge: 2019-02-16 | Payer: MEDICARE

## 2019-02-15 DIAGNOSIS — Z5111 Encounter for antineoplastic chemotherapy: Principal | ICD-10-CM

## 2019-02-15 DIAGNOSIS — C5700 Malignant neoplasm of unspecified fallopian tube: Principal | ICD-10-CM

## 2019-02-17 ENCOUNTER — Ambulatory Visit: Admit: 2019-02-17 | Discharge: 2019-02-18 | Payer: MEDICARE

## 2019-02-17 ENCOUNTER — Institutional Professional Consult (permissible substitution): Admit: 2019-02-17 | Discharge: 2019-02-18 | Payer: MEDICARE

## 2019-02-17 DIAGNOSIS — C5700 Malignant neoplasm of unspecified fallopian tube: Principal | ICD-10-CM

## 2019-02-17 DIAGNOSIS — Z5111 Encounter for antineoplastic chemotherapy: Principal | ICD-10-CM

## 2019-03-16 DIAGNOSIS — Z5111 Encounter for antineoplastic chemotherapy: Principal | ICD-10-CM

## 2019-03-16 DIAGNOSIS — C5702 Malignant neoplasm of left fallopian tube: Principal | ICD-10-CM

## 2019-03-17 ENCOUNTER — Ambulatory Visit: Admit: 2019-03-17 | Discharge: 2019-03-18 | Payer: MEDICARE

## 2019-03-26 ENCOUNTER — Ambulatory Visit: Admit: 2019-03-26 | Discharge: 2019-03-27 | Payer: MEDICARE

## 2019-03-26 DIAGNOSIS — Z5111 Encounter for antineoplastic chemotherapy: Principal | ICD-10-CM

## 2019-03-26 DIAGNOSIS — C5700 Malignant neoplasm of unspecified fallopian tube: Principal | ICD-10-CM

## 2019-04-02 ENCOUNTER — Ambulatory Visit: Admit: 2019-04-02 | Discharge: 2019-04-03 | Payer: MEDICARE

## 2019-04-08 ENCOUNTER — Ambulatory Visit: Admit: 2019-04-08 | Discharge: 2019-04-09 | Payer: MEDICARE

## 2019-04-14 ENCOUNTER — Emergency Department: Admit: 2019-04-14 | Discharge: 2019-04-15 | Disposition: A | Discharge status: Home / Self Care | Payer: MEDICARE

## 2019-04-14 ENCOUNTER — Ambulatory Visit: Admit: 2019-04-14 | Discharge: 2019-04-15 | Disposition: A | Discharge status: Home / Self Care | Payer: MEDICARE

## 2019-04-14 DIAGNOSIS — C5702 Malignant neoplasm of left fallopian tube: Principal | ICD-10-CM

## 2019-04-14 DIAGNOSIS — Z5111 Encounter for antineoplastic chemotherapy: Principal | ICD-10-CM

## 2019-04-19 ENCOUNTER — Ambulatory Visit: Admit: 2019-04-19 | Discharge: 2019-04-20 | Payer: MEDICARE

## 2019-04-20 ENCOUNTER — Ambulatory Visit: Admit: 2019-04-20 | Discharge: 2019-04-21 | Payer: MEDICARE

## 2019-04-22 ENCOUNTER — Ambulatory Visit: Admit: 2019-04-22 | Discharge: 2019-04-23 | Payer: MEDICARE

## 2019-04-26 ENCOUNTER — Ambulatory Visit: Admit: 2019-04-26 | Discharge: 2019-04-27 | Payer: MEDICARE

## 2019-04-26 DIAGNOSIS — Z5111 Encounter for antineoplastic chemotherapy: Principal | ICD-10-CM

## 2019-04-26 DIAGNOSIS — C5700 Malignant neoplasm of unspecified fallopian tube: Principal | ICD-10-CM

## 2019-05-06 ENCOUNTER — Ambulatory Visit: Admit: 2019-05-06 | Discharge: 2019-05-07 | Payer: MEDICARE

## 2019-05-06 DIAGNOSIS — C5702 Malignant neoplasm of left fallopian tube: Principal | ICD-10-CM

## 2019-05-07 ENCOUNTER — Ambulatory Visit: Admit: 2019-05-07 | Discharge: 2019-05-08 | Payer: MEDICARE

## 2019-05-07 DIAGNOSIS — Z5111 Encounter for antineoplastic chemotherapy: Principal | ICD-10-CM

## 2019-05-07 DIAGNOSIS — C5700 Malignant neoplasm of unspecified fallopian tube: Principal | ICD-10-CM

## 2019-05-07 DIAGNOSIS — C5702 Malignant neoplasm of left fallopian tube: Principal | ICD-10-CM

## 2019-05-21 MED ORDER — OXYBUTYNIN CHLORIDE 5 MG TABLET
ORAL_TABLET | 0 refills | 0 days | Status: CP
Start: 2019-05-21 — End: ?

## 2019-05-24 ENCOUNTER — Ambulatory Visit: Admit: 2019-05-24 | Discharge: 2019-05-25 | Payer: MEDICARE

## 2019-05-25 ENCOUNTER — Ambulatory Visit: Admit: 2019-05-25 | Discharge: 2019-05-26 | Payer: MEDICARE

## 2019-05-26 ENCOUNTER — Ambulatory Visit: Admit: 2019-05-26 | Discharge: 2019-05-27 | Payer: MEDICARE

## 2019-05-27 ENCOUNTER — Ambulatory Visit: Admit: 2019-05-27 | Discharge: 2019-05-28 | Payer: MEDICARE

## 2019-05-27 ENCOUNTER — Other Ambulatory Visit: Admit: 2019-05-27 | Discharge: 2019-05-28 | Payer: MEDICARE

## 2019-05-27 DIAGNOSIS — C5700 Malignant neoplasm of unspecified fallopian tube: Principal | ICD-10-CM

## 2019-05-27 DIAGNOSIS — Z5111 Encounter for antineoplastic chemotherapy: Principal | ICD-10-CM

## 2019-05-28 ENCOUNTER — Other Ambulatory Visit: Admit: 2019-05-28 | Discharge: 2019-05-29 | Payer: MEDICARE

## 2019-05-29 ENCOUNTER — Ambulatory Visit: Admit: 2019-05-29 | Discharge: 2019-05-30 | Payer: MEDICARE

## 2019-05-31 ENCOUNTER — Ambulatory Visit: Admit: 2019-05-31 | Discharge: 2019-06-01 | Payer: MEDICARE

## 2019-06-03 ENCOUNTER — Ambulatory Visit: Admit: 2019-06-03 | Discharge: 2019-06-04 | Payer: MEDICARE

## 2019-06-10 ENCOUNTER — Ambulatory Visit: Admit: 2019-06-10 | Discharge: 2019-06-11 | Payer: MEDICARE

## 2019-06-14 ENCOUNTER — Ambulatory Visit: Admit: 2019-06-14 | Discharge: 2019-06-16 | Payer: MEDICARE

## 2019-06-16 MED ORDER — ACETAMINOPHEN 325 MG TABLET
ORAL_TABLET | Freq: Four times a day (QID) | ORAL | 2 refills | 13 days
Start: 2019-06-16 — End: 2020-06-15

## 2019-06-16 MED ORDER — OXYCODONE 5 MG TABLET
ORAL_TABLET | ORAL | 0 refills | 2 days | Status: CP | PRN
Start: 2019-06-16 — End: 2019-06-21

## 2019-06-18 ENCOUNTER — Ambulatory Visit: Admit: 2019-06-18 | Discharge: 2019-06-19 | Payer: MEDICARE

## 2019-06-18 DIAGNOSIS — I10 Essential (primary) hypertension: Principal | ICD-10-CM

## 2019-06-18 DIAGNOSIS — Z1231 Encounter for screening mammogram for malignant neoplasm of breast: Principal | ICD-10-CM

## 2019-06-18 DIAGNOSIS — Z1239 Encounter for other screening for malignant neoplasm of breast: Principal | ICD-10-CM

## 2019-06-18 DIAGNOSIS — Z79899 Other long term (current) drug therapy: Principal | ICD-10-CM

## 2019-06-18 DIAGNOSIS — H5509 Other forms of nystagmus: Principal | ICD-10-CM

## 2019-06-18 DIAGNOSIS — R296 Repeated falls: Principal | ICD-10-CM

## 2019-06-18 DIAGNOSIS — H8193 Unspecified disorder of vestibular function, bilateral: Principal | ICD-10-CM

## 2019-06-18 MED ORDER — OXYBUTYNIN CHLORIDE 5 MG TABLET
ORAL_TABLET | Freq: Four times a day (QID) | ORAL | 1 refills | 90 days | Status: CP
Start: 2019-06-18 — End: ?

## 2019-06-22 ENCOUNTER — Ambulatory Visit: Admit: 2019-06-22 | Discharge: 2019-06-23 | Payer: MEDICARE

## 2019-06-22 MED ORDER — OXYCODONE 5 MG TABLET
ORAL_TABLET | ORAL | 0 refills | 2 days | Status: CP | PRN
Start: 2019-06-22 — End: 2019-06-27

## 2019-06-23 ENCOUNTER — Ambulatory Visit: Admit: 2019-06-23 | Discharge: 2019-06-24 | Payer: MEDICARE

## 2019-06-23 DIAGNOSIS — Z5111 Encounter for antineoplastic chemotherapy: Principal | ICD-10-CM

## 2019-06-23 DIAGNOSIS — C5700 Malignant neoplasm of unspecified fallopian tube: Principal | ICD-10-CM

## 2019-06-23 DIAGNOSIS — C5702 Malignant neoplasm of left fallopian tube: Principal | ICD-10-CM

## 2019-07-06 ENCOUNTER — Ambulatory Visit: Admit: 2019-07-06 | Discharge: 2019-07-07 | Payer: MEDICARE

## 2019-07-08 DIAGNOSIS — Z5111 Encounter for antineoplastic chemotherapy: Principal | ICD-10-CM

## 2019-07-08 DIAGNOSIS — C5702 Malignant neoplasm of left fallopian tube: Principal | ICD-10-CM

## 2019-07-09 ENCOUNTER — Ambulatory Visit: Admit: 2019-07-09 | Discharge: 2019-07-10 | Payer: MEDICARE

## 2019-07-12 ENCOUNTER — Ambulatory Visit
Admit: 2019-07-12 | Discharge: 2019-07-13 | Payer: MEDICARE | Attending: Gynecologic Oncology | Primary: Gynecologic Oncology

## 2019-07-12 DIAGNOSIS — C5700 Malignant neoplasm of unspecified fallopian tube: Principal | ICD-10-CM

## 2019-07-12 MED ORDER — ONDANSETRON HCL 8 MG TABLET
ORAL_TABLET | Freq: Three times a day (TID) | ORAL | 2 refills | 10 days | Status: CP | PRN
Start: 2019-07-12 — End: ?

## 2019-07-12 MED ORDER — NIRAPARIB 100 MG CAPSULE
ORAL_CAPSULE | 2 refills | 0 days | Status: CP
Start: 2019-07-12 — End: ?
  Filled 2019-08-20: qty 10, 5d supply, fill #0

## 2019-07-13 DIAGNOSIS — C5700 Malignant neoplasm of unspecified fallopian tube: Principal | ICD-10-CM

## 2019-07-28 ENCOUNTER — Ambulatory Visit: Admit: 2019-07-28 | Discharge: 2019-07-29 | Payer: MEDICARE

## 2019-07-28 DIAGNOSIS — H5509 Other forms of nystagmus: Principal | ICD-10-CM

## 2019-07-28 DIAGNOSIS — R296 Repeated falls: Principal | ICD-10-CM

## 2019-07-28 DIAGNOSIS — G620 Drug-induced polyneuropathy: Principal | ICD-10-CM

## 2019-07-28 DIAGNOSIS — H8193 Unspecified disorder of vestibular function, bilateral: Principal | ICD-10-CM

## 2019-08-03 MED ORDER — OFLOXACIN 0.3 % EYE DROPS
0 days
Start: 2019-08-03 — End: ?

## 2019-08-16 MED ORDER — LISINOPRIL 20 MG TABLET
ORAL_TABLET | 0 refills | 0 days | Status: CP
Start: 2019-08-16 — End: ?

## 2019-08-17 DIAGNOSIS — C5700 Malignant neoplasm of unspecified fallopian tube: Principal | ICD-10-CM

## 2019-08-18 ENCOUNTER — Ambulatory Visit: Admit: 2019-08-18 | Discharge: 2019-08-19 | Payer: MEDICARE

## 2019-08-18 LAB — COMPREHENSIVE METABOLIC PANEL
ALKALINE PHOSPHATASE: 89 U/L (ref 46–116)
ALT (SGPT): 16 U/L (ref 10–49)
ANION GAP: 8 mmol/L (ref 3–11)
AST (SGOT): 25 U/L (ref <34)
BILIRUBIN TOTAL: 0.4 mg/dL (ref 0.3–1.2)
BLOOD UREA NITROGEN: 21 mg/dL (ref 9–23)
BUN / CREAT RATIO: 20
CALCIUM: 9.6 mg/dL (ref 8.7–10.4)
CO2: 26.2 mmol/L (ref 20.0–31.0)
CREATININE: 1.03 mg/dL (ref 0.60–1.10)
EGFR CKD-EPI AA FEMALE: 62 mL/min/{1.73_m2}
EGFR CKD-EPI NON-AA FEMALE: 54 mL/min/{1.73_m2}
GLUCOSE RANDOM: 154 mg/dL (ref 70–179)
POTASSIUM: 3.8 mmol/L (ref 3.5–5.1)
PROTEIN TOTAL: 6.4 g/dL (ref 5.7–8.2)
SODIUM: 140 mmol/L (ref 135–145)

## 2019-08-18 LAB — CBC W/ AUTO DIFF
BASOPHILS ABSOLUTE COUNT: 0 10*9/L (ref 0.0–0.1)
BASOPHILS RELATIVE PERCENT: 0.6 %
EOSINOPHILS ABSOLUTE COUNT: 0.1 10*9/L (ref 0.0–0.7)
EOSINOPHILS RELATIVE PERCENT: 2.8 %
HEMATOCRIT: 29.1 % — ABNORMAL LOW (ref 35.0–44.0)
HEMOGLOBIN: 10.1 g/dL — ABNORMAL LOW (ref 12.0–15.5)
LYMPHOCYTES ABSOLUTE COUNT: 1.1 10*9/L (ref 0.7–4.0)
LYMPHOCYTES RELATIVE PERCENT: 25.6 %
MEAN CORPUSCULAR HEMOGLOBIN: 38.6 pg — ABNORMAL HIGH (ref 26.0–34.0)
MEAN CORPUSCULAR VOLUME: 111.4 fL — ABNORMAL HIGH (ref 82.0–98.0)
MEAN PLATELET VOLUME: 7.5 fL (ref 7.0–10.0)
MONOCYTES ABSOLUTE COUNT: 0.5 10*9/L (ref 0.1–1.0)
NEUTROPHILS ABSOLUTE COUNT: 2.6 10*9/L (ref 1.7–7.7)
NEUTROPHILS RELATIVE PERCENT: 59.2 %
NUCLEATED RED BLOOD CELLS: 0 /100{WBCs} (ref <=4)
PLATELET COUNT: 124 10*9/L — ABNORMAL LOW (ref 150–450)
RED BLOOD CELL COUNT: 2.61 10*12/L — ABNORMAL LOW (ref 3.90–5.03)
RED CELL DISTRIBUTION WIDTH: 14.3 % (ref 12.0–15.0)
WBC ADJUSTED: 4.4 10*9/L (ref 3.5–10.5)

## 2019-08-18 LAB — BASOPHILS ABSOLUTE COUNT: Basophils:NCnc:Pt:Bld:Qn:Automated count: 0

## 2019-08-18 LAB — ALKALINE PHOSPHATASE: Alkaline phosphatase:CCnc:Pt:Ser/Plas:Qn:: 89

## 2019-08-19 NOTE — Unmapped (Signed)
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  I am speaking to the patient.    Was a Nurse, learning disability used for this call? No    Verified patient's date of birth / HIPAA.    Specialty medication(s) to be sent: Hematology/Oncology: Zejula      Non-specialty medications/supplies to be sent: n/a      Medications not needed at this time: n/a         ZeJula (niraparib)    Medication & Administration     Dosage: 100mg  (2 capsules) by mouth once daily (at the same time each day)    Administration: This an oral medication and can be taken with or without food.  Swallow the capsule whole and do not break, open, or chew the capsule.    Adherence/Missed dose instructions: If you miss a dose, do not take two doses to make up for a missed dose.  If you vomit after taking your dose, do not take another dose. Take the next dose at its normally scheduled time.    Goals of Therapy     Prevent disease progression    Side Effects & Monitoring Parameters     ??? Nausea  ??? Vomiting  ??? Constipation  ??? Fatigue  ??? Infection precautions (neutropenia)  ??? Anemia   ??? Bleeding precautions (thrombocytopenia)  ??? Insomnia  ??? Decreased appetite and changes in taste  ??? Headache  ??? Elevated Liver Function test (AST and/or ALT)    The following side effects should be reported to the provider:  ??? Heartbeat that doesn't feel normal (heart feels like it's racing, skipping a beat or fluttering)  ??? Hypertension  ??? Dark urine or yellow skin or eyes  ??? Signs of infection (fever, chills, cough)  ??? Excessive bruising, gums bleeding or nose bleeds    Monitoring Parameters:  ??? CBC with differential weekly for the first month, then monthly for 11 months and then once every 2-3 months   ??? Monitor blood pressure and heart rate weekly for the first 2 months, then monthly for the next 10 months then once every 2-3 months   ??? Pregnancy test prior to starting      Contraindications, Warnings, & Precautions     ??? Monitor blood pressure  ??? Nelida Gores is considered toxic to an embryo or fetus.  Effective birth control should be used during treatment and for 6 months following the completion of therapy.  ??? Signs of an allergic reaction (rash, hives, shortness of breath)    Drug/Food Interactions     ??? Medication list reviewed in Epic. The patient was instructed to inform the care team before taking any new medications or supplements including over the counter medications, vitamins, and herbal supplements. No drug interactions identified.     Storage, Handling Precautions, & Disposal   ??? This medication should be stored at room temperature and in a dry location. Keep out of reach of others including children and pets. Keep the medicine in the original container with a child-proof top (no pillboxes).   ??? Do not throw away or flush unused medication down the toilet or sink. This drug is considered hazardous and should be handled as little as possible.  If someone else helps with medication administration, they should wear gloves          Current Medications (including OTC/herbals), Comorbidities  and Allergies     Current Outpatient Medications   Medication Sig Dispense Refill   ??? acetaminophen (TYLENOL) 325 MG tablet Take 2 tablets (650 mg total) by mouth Every six (6) hours. 100 tablet 2   ??? alendronate (FOSAMAX) 70 MG tablet Take 1 tablet (70 mg total) by mouth every seven (7) days. 12 tablet 3   ??? ARIPiprazole (ABILIFY) 10 MG tablet Take 10 mg by mouth daily.     ??? ascorbic acid, vitamin C, (VITAMIN C) 1000 MG tablet Take 1,000 mg by mouth daily.     ??? atorvastatin (LIPITOR) 10 MG tablet Take 1 tablet by mouth nightly 90 tablet 3   ??? b complex vitamins capsule Take 1 capsule by mouth daily.     ??? CALCIUM ORAL Take by mouth daily.     ??? cholecalciferol, vitamin D3, (VITAMIN D3) 1,000 unit capsule Take 1,000 Units by mouth daily.     ??? latanoprost (XALATAN) 0.005 % ophthalmic solution      ??? lisinopriL (PRINIVIL,ZESTRIL) 20 MG tablet Take 1 tablet by mouth once daily 90 tablet 0   ??? methylphenidate HCl 20 mg CDES Take 30 mg by mouth daily.      ??? multivitamin capsule Take 1 capsule by mouth daily.     ??? niraparib 100 mg cap Take 2 capsules (200 mg total) by mouth once daily. 60 capsule 2   ??? ondansetron (ZOFRAN) 8 MG tablet Take 1 tablet (8 mg total) by mouth every eight (8) hours as needed for nausea. 30 tablet 2   ??? oxybutynin (DITROPAN) 5 MG tablet Take 1 tablet (5 mg total) by mouth Four (4) times a day. 360 tablet 1   ??? PYRIDOXINE HCL, VITAMIN B6, (VITAMIN B-6 ORAL) Take 1 tablet by mouth daily.     ??? traZODone (DESYREL) 100 MG tablet Take 150 mg by mouth nightly.      ??? venlafaxine (EFFEXOR-XR) 150 MG 24 hr capsule Take 2 capsules (300 mg total) by mouth every morning. 180 capsule 3     No current facility-administered medications for this visit.       Allergies   Allergen Reactions   ??? Codeine Rash   ??? Sulfa (Sulfonamide Antibiotics) Rash and Nausea Only       Patient Active Problem List   Diagnosis   ??? Osteoporosis   ??? Hammertoe - surgery Spring 2014   ??? Major depressive disorder   ??? Anxiety   ??? Borderline personality disorder (CMS-HCC)   ??? Rib pain on left side   ??? Restless legs syndrome   ??? Sleep apnea   ??? Irritable colon   ??? Seborrheic keratosis   ??? Periodic limb movement disorder   ??? Benign essential HTN   ??? Knee joint replacement status   ??? Urge incontinence   ??? Piriformis muscle pain - intermittent   ??? Left lumbosacral radiculopathy   ??? Sacroiliac joint dysfunction of left side   ??? Vestibular dysfunction   ??? Falls frequently   ??? OSA on CPAP   ??? Left hip pain   ??? Encounter for long-term current use of medication   ??? Vitamin D deficiency   ??? Transient alteration of awareness   ??? Fallopian tube cancer, carcinoma (CMS-HCC)   ??? Encounter for antineoplastic chemotherapy   ??? Anemia associated with chemotherapy   ??? Pleuritic chest pain Reviewed and up to date in Epic.    Appropriateness of Therapy     Is medication and dose appropriate based  on diagnosis? Yes    Prescription has been clinically reviewed: Yes    Baseline Quality of Life Assessment      How many days over the past month did your condition  keep you from your normal activities? For example, brushing your teeth or getting up in the morning. 0    Financial Information     Medication Assistance provided: [place]      Anticipated copay of $678.55 for 10 capsules  reviewed with patient. Verified delivery address.    Delivery Information     Scheduled delivery date: 08/20/19    Expected start date: TBD    Medication will be delivered via Same Day Courier to the prescription address in [facility]  This shipment will not require a signature.      Explained the services we provide at [facility] and that each month we would call to set up refills.  Stressed importance of returning phone calls so that we could ensure they receive their medications in time each month.  Informed patient that we should be setting up refills 7-10 days prior to when they will run out of medication.  A pharmacist will reach out to perform a clinical assessment periodically.  Informed patient that a welcome packet and a drug information handout will be sent.      Patient verbalized understanding of the above information as well as how to contact the pharmacy at [phone] option 4 with any questions/concerns.  The pharmacy is open Monday through Friday 8:30am-4:30pm.  A pharmacist is available 24/7 via pager to answer any clinical questions they may have.    Patient Specific Needs     - Does the patient have any physical, cognitive, or cultural barriers? No    - Patient prefers to have medications discussed with  Patient     - Is the patient or caregiver able to read and understand education materials at a high school level or above? Yes    - Patient's primary language is  English     - Is the patient high risk? Yes, patient is taking oral chemotherapy. Appropriateness of therapy has been assessed.     - Does the patient require a Care Management Plan? No     - Does the patient require physician intervention or other additional services (i.e. nutrition, smoking cessation, social work)? No      Florene Route [facility] Specialty Pharmacist

## 2019-08-20 MED FILL — ZEJULA 100 MG CAPSULE: 5 days supply | Qty: 10 | Fill #0 | Status: AC

## 2019-08-24 NOTE — Unmapped (Signed)
This patient has been disenrolled from the [facility] specialty pharmacy services due to enrollment in a manufacturer assistance program that sends medicine directly to the patient.    Lisa Burton  [facility] Specialty Pharmacist

## 2019-08-26 ENCOUNTER — Ambulatory Visit: Admit: 2019-08-26 | Discharge: 2019-08-27 | Payer: MEDICARE

## 2019-09-01 MED ORDER — BRIMONIDINE 0.2 % EYE DROPS
0 days
Start: 2019-09-01 — End: ?

## 2019-09-01 MED ORDER — PREDNISOLONE ACETATE 1 % EYE DROPS,SUSPENSION
OPHTHALMIC | 0 days
Start: 2019-09-01 — End: 2019-10-01

## 2019-09-01 MED ORDER — KETOROLAC 0.5 % EYE DROPS
0 days
Start: 2019-09-01 — End: ?

## 2019-09-02 ENCOUNTER — Ambulatory Visit: Admit: 2019-09-02 | Discharge: 2019-09-03 | Payer: MEDICARE

## 2019-09-09 ENCOUNTER — Ambulatory Visit: Admit: 2019-09-09 | Discharge: 2019-09-10 | Payer: MEDICARE

## 2019-09-14 ENCOUNTER — Ambulatory Visit: Admit: 2019-09-14 | Discharge: 2019-09-14 | Payer: MEDICARE

## 2019-09-14 DIAGNOSIS — Z5111 Encounter for antineoplastic chemotherapy: Principal | ICD-10-CM

## 2019-09-14 DIAGNOSIS — R971 Elevated cancer antigen 125 [CA 125]: Principal | ICD-10-CM

## 2019-09-16 ENCOUNTER — Ambulatory Visit: Admit: 2019-09-16 | Discharge: 2019-09-17 | Payer: MEDICARE

## 2019-09-16 DIAGNOSIS — C5700 Malignant neoplasm of unspecified fallopian tube: Principal | ICD-10-CM

## 2019-09-20 ENCOUNTER — Ambulatory Visit: Admit: 2019-09-20 | Discharge: 2019-09-20 | Payer: MEDICARE

## 2019-09-20 ENCOUNTER — Ambulatory Visit
Admit: 2019-09-20 | Discharge: 2019-09-20 | Payer: MEDICARE | Attending: Gynecologic Oncology | Primary: Gynecologic Oncology

## 2019-09-20 DIAGNOSIS — C5700 Malignant neoplasm of unspecified fallopian tube: Principal | ICD-10-CM

## 2019-09-29 ENCOUNTER — Ambulatory Visit: Admit: 2019-09-29 | Discharge: 2019-09-30 | Payer: MEDICARE

## 2019-09-29 ENCOUNTER — Ambulatory Visit: Admit: 2019-09-29 | Discharge: 2019-09-30 | Payer: MEDICARE | Attending: Urgent Care | Primary: Urgent Care

## 2019-09-29 DIAGNOSIS — S63641A Sprain of metacarpophalangeal joint of right thumb, initial encounter: Principal | ICD-10-CM

## 2019-09-30 ENCOUNTER — Ambulatory Visit: Admit: 2019-09-30 | Discharge: 2019-10-01 | Payer: MEDICARE

## 2019-10-07 ENCOUNTER — Ambulatory Visit: Admit: 2019-10-07 | Discharge: 2019-10-08 | Payer: MEDICARE

## 2019-10-15 ENCOUNTER — Ambulatory Visit: Admit: 2019-10-15 | Discharge: 2019-10-16 | Payer: MEDICARE

## 2019-10-15 DIAGNOSIS — C5700 Malignant neoplasm of unspecified fallopian tube: Principal | ICD-10-CM

## 2019-10-16 MED ORDER — CYCLOBENZAPRINE 10 MG TABLET
Freq: Three times a day (TID) | ORAL | 0 days
Start: 2019-10-16 — End: ?

## 2019-10-19 ENCOUNTER — Ambulatory Visit: Admit: 2019-10-19 | Discharge: 2019-10-20 | Payer: MEDICARE

## 2019-10-19 MED ORDER — ATORVASTATIN 10 MG TABLET
ORAL_TABLET | Freq: Every evening | ORAL | 3 refills | 90 days | Status: CP
Start: 2019-10-19 — End: ?

## 2019-10-19 MED ORDER — LISINOPRIL 20 MG TABLET
ORAL_TABLET | Freq: Every day | ORAL | 1 refills | 90.00000 days | Status: CP
Start: 2019-10-19 — End: ?

## 2019-10-20 ENCOUNTER — Ambulatory Visit: Admit: 2019-10-20 | Discharge: 2019-10-21 | Payer: MEDICARE

## 2019-10-21 ENCOUNTER — Ambulatory Visit: Admit: 2019-10-21 | Discharge: 2019-10-22 | Payer: MEDICARE

## 2019-10-25 ENCOUNTER — Emergency Department
Admit: 2019-10-25 | Discharge: 2019-10-25 | Disposition: A | Discharge status: Home / Self Care | Payer: MEDICARE | Attending: Emergency Medicine

## 2019-10-25 ENCOUNTER — Ambulatory Visit
Admit: 2019-10-25 | Discharge: 2019-10-25 | Disposition: A | Discharge status: Home / Self Care | Payer: MEDICARE | Attending: Emergency Medicine

## 2019-10-28 ENCOUNTER — Ambulatory Visit: Admit: 2019-10-28 | Discharge: 2019-10-29 | Payer: MEDICARE

## 2019-11-04 ENCOUNTER — Ambulatory Visit: Admit: 2019-11-04 | Discharge: 2019-11-04 | Payer: MEDICARE | Attending: Urgent Care | Primary: Urgent Care

## 2019-11-04 ENCOUNTER — Ambulatory Visit: Admit: 2019-11-04 | Discharge: 2019-11-04 | Payer: MEDICARE

## 2019-11-04 DIAGNOSIS — S76212A Strain of adductor muscle, fascia and tendon of left thigh, initial encounter: Principal | ICD-10-CM

## 2019-11-04 DIAGNOSIS — C5700 Malignant neoplasm of unspecified fallopian tube: Principal | ICD-10-CM

## 2019-11-04 MED ORDER — IBUPROFEN 800 MG TABLET
ORAL_TABLET | Freq: Three times a day (TID) | ORAL | 0 refills | 7 days | Status: CP | PRN
Start: 2019-11-04 — End: 2019-11-11

## 2019-11-04 MED ORDER — NIRAPARIB 100 MG CAPSULE
ORAL_CAPSULE | Freq: Every day | ORAL | 5 refills | 0.00000 days | Status: CP
Start: 2019-11-04 — End: ?

## 2019-11-04 MED ORDER — CYCLOBENZAPRINE 10 MG TABLET
ORAL_TABLET | Freq: Three times a day (TID) | ORAL | 0 refills | 7 days | Status: CP | PRN
Start: 2019-11-04 — End: 2019-11-11

## 2019-11-11 ENCOUNTER — Ambulatory Visit: Admit: 2019-11-11 | Discharge: 2019-11-12 | Payer: MEDICARE

## 2019-11-19 ENCOUNTER — Ambulatory Visit: Admit: 2019-11-19 | Discharge: 2019-11-19 | Payer: MEDICARE

## 2019-11-19 DIAGNOSIS — S76212A Strain of adductor muscle, fascia and tendon of left thigh, initial encounter: Principal | ICD-10-CM

## 2019-11-19 MED ORDER — CYCLOBENZAPRINE 5 MG TABLET
ORAL_TABLET | Freq: Every evening | ORAL | 0 refills | 10 days | Status: CP | PRN
Start: 2019-11-19 — End: 2019-11-29

## 2019-12-01 DIAGNOSIS — C5700 Malignant neoplasm of unspecified fallopian tube: Principal | ICD-10-CM

## 2019-12-03 ENCOUNTER — Ambulatory Visit
Admit: 2019-12-03 | Discharge: 2020-01-01 | Payer: MEDICARE | Attending: Student in an Organized Health Care Education/Training Program | Primary: Student in an Organized Health Care Education/Training Program

## 2019-12-03 ENCOUNTER — Ambulatory Visit
Admit: 2019-12-03 | Discharge: 2020-01-01 | Payer: MEDICARE | Attending: Rehabilitative and Restorative Service Providers" | Primary: Rehabilitative and Restorative Service Providers"

## 2019-12-06 ENCOUNTER — Ambulatory Visit
Admit: 2019-12-06 | Discharge: 2019-12-07 | Payer: MEDICARE | Attending: Gynecologic Oncology | Primary: Gynecologic Oncology

## 2019-12-06 ENCOUNTER — Other Ambulatory Visit: Admit: 2019-12-06 | Discharge: 2019-12-07 | Payer: MEDICARE

## 2019-12-06 DIAGNOSIS — C5700 Malignant neoplasm of unspecified fallopian tube: Principal | ICD-10-CM

## 2019-12-06 DIAGNOSIS — Z5111 Encounter for antineoplastic chemotherapy: Principal | ICD-10-CM

## 2019-12-09 ENCOUNTER — Ambulatory Visit
Admit: 2019-12-09 | Discharge: 2020-01-07 | Payer: MEDICARE | Attending: Student in an Organized Health Care Education/Training Program | Primary: Student in an Organized Health Care Education/Training Program

## 2019-12-13 ENCOUNTER — Ambulatory Visit
Admit: 2019-12-13 | Discharge: 2020-01-11 | Payer: MEDICARE | Attending: Rehabilitative and Restorative Service Providers" | Primary: Rehabilitative and Restorative Service Providers"

## 2019-12-14 ENCOUNTER — Ambulatory Visit
Admit: 2019-12-14 | Discharge: 2020-01-12 | Payer: MEDICARE | Attending: Rehabilitative and Restorative Service Providers" | Primary: Rehabilitative and Restorative Service Providers"

## 2019-12-15 MED ORDER — SODIUM FLUORIDE 1.1 %-POTASSIUM NITRATE 5 % DENTAL PASTE
0.00000 days
Start: 2019-12-15 — End: ?

## 2019-12-21 ENCOUNTER — Ambulatory Visit
Admit: 2019-12-21 | Discharge: 2019-12-22 | Disposition: A | Discharge status: Home / Self Care | Payer: MEDICARE | Attending: Emergency Medicine

## 2019-12-22 DIAGNOSIS — Z9181 History of falling: Principal | ICD-10-CM

## 2019-12-23 ENCOUNTER — Ambulatory Visit: Admit: 2019-12-23 | Discharge: 2019-12-24 | Payer: MEDICARE

## 2019-12-31 ENCOUNTER — Ambulatory Visit
Admit: 2019-12-31 | Discharge: 2020-01-01 | Payer: MEDICARE | Attending: Physician Assistant | Primary: Physician Assistant

## 2019-12-31 DIAGNOSIS — Z4802 Encounter for removal of sutures: Principal | ICD-10-CM

## 2019-12-31 DIAGNOSIS — T148XXA Other injury of unspecified body region, initial encounter: Principal | ICD-10-CM

## 2019-12-31 DIAGNOSIS — L0390 Cellulitis, unspecified: Principal | ICD-10-CM

## 2019-12-31 MED ORDER — CEPHALEXIN 500 MG CAPSULE
ORAL_CAPSULE | Freq: Three times a day (TID) | ORAL | 0 refills | 7.00000 days | Status: CP
Start: 2019-12-31 — End: 2020-01-07

## 2020-01-04 ENCOUNTER — Ambulatory Visit: Admit: 2020-01-04 | Discharge: 2020-01-05 | Payer: MEDICARE

## 2020-01-04 DIAGNOSIS — S41112D Laceration without foreign body of left upper arm, subsequent encounter: Principal | ICD-10-CM

## 2020-01-12 ENCOUNTER — Ambulatory Visit
Admit: 2020-01-12 | Discharge: 2020-02-06 | Payer: MEDICARE | Attending: Rehabilitative and Restorative Service Providers" | Primary: Rehabilitative and Restorative Service Providers"

## 2020-01-12 ENCOUNTER — Ambulatory Visit
Admit: 2020-01-12 | Discharge: 2020-02-06 | Payer: MEDICARE | Attending: Student in an Organized Health Care Education/Training Program | Primary: Student in an Organized Health Care Education/Training Program

## 2020-01-13 ENCOUNTER — Ambulatory Visit: Admit: 2020-01-13 | Discharge: 2020-01-14 | Payer: MEDICARE

## 2020-01-19 DIAGNOSIS — C5700 Malignant neoplasm of unspecified fallopian tube: Principal | ICD-10-CM

## 2020-01-19 MED ORDER — LEVOMEFOLATE CA 3 MG-B6 35 MG-MEB12 2 MG-ALGAL OIL 90.314 MG CAPSULE
ORAL_CAPSULE | Freq: Every day | ORAL | 6 refills | 30 days | Status: CP
Start: 2020-01-19 — End: ?

## 2020-01-20 ENCOUNTER — Ambulatory Visit: Admit: 2020-01-20 | Discharge: 2020-01-21 | Payer: MEDICARE

## 2020-01-25 ENCOUNTER — Ambulatory Visit
Admit: 2020-01-25 | Discharge: 2020-01-31 | Payer: MEDICARE | Attending: Student in an Organized Health Care Education/Training Program | Primary: Student in an Organized Health Care Education/Training Program

## 2020-01-26 ENCOUNTER — Ambulatory Visit
Admit: 2020-01-26 | Discharge: 2020-02-24 | Payer: MEDICARE | Attending: Rehabilitative and Restorative Service Providers" | Primary: Rehabilitative and Restorative Service Providers"

## 2020-02-02 DIAGNOSIS — C5700 Malignant neoplasm of unspecified fallopian tube: Principal | ICD-10-CM

## 2020-02-07 ENCOUNTER — Ambulatory Visit
Admit: 2020-02-07 | Discharge: 2020-02-07 | Payer: MEDICARE | Attending: Gynecologic Oncology | Primary: Gynecologic Oncology

## 2020-02-07 ENCOUNTER — Other Ambulatory Visit: Admit: 2020-02-07 | Discharge: 2020-02-07 | Payer: MEDICARE

## 2020-02-07 DIAGNOSIS — C5700 Malignant neoplasm of unspecified fallopian tube: Principal | ICD-10-CM

## 2020-02-07 DIAGNOSIS — Z5111 Encounter for antineoplastic chemotherapy: Principal | ICD-10-CM

## 2020-02-08 ENCOUNTER — Ambulatory Visit
Admit: 2020-02-08 | Discharge: 2020-03-07 | Payer: MEDICARE | Attending: Student in an Organized Health Care Education/Training Program | Primary: Student in an Organized Health Care Education/Training Program

## 2020-02-08 ENCOUNTER — Ambulatory Visit
Admit: 2020-02-08 | Discharge: 2020-03-07 | Payer: MEDICARE | Attending: Rehabilitative and Restorative Service Providers" | Primary: Rehabilitative and Restorative Service Providers"

## 2020-02-15 DIAGNOSIS — C5700 Malignant neoplasm of unspecified fallopian tube: Principal | ICD-10-CM

## 2020-02-16 ENCOUNTER — Ambulatory Visit
Admit: 2020-02-16 | Discharge: 2020-02-17 | Payer: MEDICARE | Attending: Student in an Organized Health Care Education/Training Program | Primary: Student in an Organized Health Care Education/Training Program

## 2020-02-16 DIAGNOSIS — L84 Corns and callosities: Principal | ICD-10-CM

## 2020-02-18 ENCOUNTER — Ambulatory Visit
Admit: 2020-02-18 | Discharge: 2020-03-12 | Payer: MEDICARE | Attending: Rehabilitative and Restorative Service Providers" | Primary: Rehabilitative and Restorative Service Providers"

## 2020-02-18 DIAGNOSIS — R531 Weakness: Principal | ICD-10-CM

## 2020-02-18 DIAGNOSIS — R2689 Other abnormalities of gait and mobility: Principal | ICD-10-CM

## 2020-02-22 DIAGNOSIS — R531 Weakness: Principal | ICD-10-CM

## 2020-02-22 DIAGNOSIS — R2689 Other abnormalities of gait and mobility: Principal | ICD-10-CM

## 2020-02-22 DIAGNOSIS — G63 Polyneuropathy in diseases classified elsewhere: Principal | ICD-10-CM

## 2020-02-22 DIAGNOSIS — C801 Malignant (primary) neoplasm, unspecified: Principal | ICD-10-CM

## 2020-02-22 DIAGNOSIS — Z9181 History of falling: Principal | ICD-10-CM

## 2020-03-03 MED ORDER — ALENDRONATE 70 MG TABLET
ORAL_TABLET | 0 refills | 0 days
Start: 2020-03-03 — End: ?

## 2020-03-06 MED ORDER — ALENDRONATE 70 MG TABLET
ORAL_TABLET | 0 refills | 0 days | Status: CP
Start: 2020-03-06 — End: ?

## 2020-03-07 DIAGNOSIS — R531 Weakness: Principal | ICD-10-CM

## 2020-03-07 DIAGNOSIS — C801 Malignant (primary) neoplasm, unspecified: Principal | ICD-10-CM

## 2020-03-07 DIAGNOSIS — Z9181 History of falling: Principal | ICD-10-CM

## 2020-03-07 DIAGNOSIS — G63 Polyneuropathy in diseases classified elsewhere: Principal | ICD-10-CM

## 2020-03-07 DIAGNOSIS — R2689 Other abnormalities of gait and mobility: Principal | ICD-10-CM

## 2020-03-08 ENCOUNTER — Ambulatory Visit: Admit: 2020-03-08 | Discharge: 2020-03-09 | Payer: MEDICARE

## 2020-03-08 DIAGNOSIS — C5700 Malignant neoplasm of unspecified fallopian tube: Principal | ICD-10-CM

## 2020-03-13 ENCOUNTER — Ambulatory Visit: Admit: 2020-03-13 | Discharge: 2020-03-14 | Payer: MEDICARE

## 2020-03-13 DIAGNOSIS — C5700 Malignant neoplasm of unspecified fallopian tube: Principal | ICD-10-CM

## 2020-03-13 MED ORDER — OLAPARIB 150 MG TABLET
ORAL_TABLET | Freq: Two times a day (BID) | ORAL | 5 refills | 30 days | Status: CP
Start: 2020-03-13 — End: 2020-06-06

## 2020-03-14 ENCOUNTER — Ambulatory Visit
Admit: 2020-03-14 | Discharge: 2020-03-31 | Payer: MEDICARE | Attending: Student in an Organized Health Care Education/Training Program | Primary: Student in an Organized Health Care Education/Training Program

## 2020-03-14 DIAGNOSIS — G63 Polyneuropathy in diseases classified elsewhere: Principal | ICD-10-CM

## 2020-03-14 DIAGNOSIS — R531 Weakness: Principal | ICD-10-CM

## 2020-03-14 DIAGNOSIS — C801 Malignant (primary) neoplasm, unspecified: Principal | ICD-10-CM

## 2020-03-14 DIAGNOSIS — Z9181 History of falling: Principal | ICD-10-CM

## 2020-03-14 DIAGNOSIS — R2689 Other abnormalities of gait and mobility: Principal | ICD-10-CM

## 2020-03-27 MED ORDER — OXYBUTYNIN CHLORIDE 5 MG TABLET
ORAL_TABLET | 0 refills | 0 days | Status: CP
Start: 2020-03-27 — End: ?

## 2020-03-28 ENCOUNTER — Ambulatory Visit
Admit: 2020-03-28 | Discharge: 2020-04-26 | Payer: MEDICARE | Attending: Student in an Organized Health Care Education/Training Program | Primary: Student in an Organized Health Care Education/Training Program

## 2020-03-28 DIAGNOSIS — G63 Polyneuropathy in diseases classified elsewhere: Principal | ICD-10-CM

## 2020-03-28 DIAGNOSIS — Z9181 History of falling: Principal | ICD-10-CM

## 2020-03-28 DIAGNOSIS — R2689 Other abnormalities of gait and mobility: Principal | ICD-10-CM

## 2020-03-28 DIAGNOSIS — C801 Malignant (primary) neoplasm, unspecified: Principal | ICD-10-CM

## 2020-04-02 DIAGNOSIS — C5700 Malignant neoplasm of unspecified fallopian tube: Principal | ICD-10-CM

## 2020-04-03 ENCOUNTER — Other Ambulatory Visit: Admit: 2020-04-03 | Discharge: 2020-04-03 | Payer: MEDICARE

## 2020-04-03 ENCOUNTER — Ambulatory Visit
Admit: 2020-04-03 | Discharge: 2020-04-03 | Payer: MEDICARE | Attending: Gynecologic Oncology | Primary: Gynecologic Oncology

## 2020-04-03 DIAGNOSIS — F419 Anxiety disorder, unspecified: Principal | ICD-10-CM

## 2020-04-03 DIAGNOSIS — Z9049 Acquired absence of other specified parts of digestive tract: Principal | ICD-10-CM

## 2020-04-03 DIAGNOSIS — N83201 Unspecified ovarian cyst, right side: Principal | ICD-10-CM

## 2020-04-03 DIAGNOSIS — Z90710 Acquired absence of both cervix and uterus: Principal | ICD-10-CM

## 2020-04-03 DIAGNOSIS — F329 Major depressive disorder, single episode, unspecified: Principal | ICD-10-CM

## 2020-04-03 DIAGNOSIS — K769 Liver disease, unspecified: Principal | ICD-10-CM

## 2020-04-03 DIAGNOSIS — F603 Borderline personality disorder: Principal | ICD-10-CM

## 2020-04-03 DIAGNOSIS — R188 Other ascites: Principal | ICD-10-CM

## 2020-04-03 DIAGNOSIS — Z885 Allergy status to narcotic agent status: Principal | ICD-10-CM

## 2020-04-03 DIAGNOSIS — C786 Secondary malignant neoplasm of retroperitoneum and peritoneum: Principal | ICD-10-CM

## 2020-04-03 DIAGNOSIS — Z96653 Presence of artificial knee joint, bilateral: Principal | ICD-10-CM

## 2020-04-03 DIAGNOSIS — G4700 Insomnia, unspecified: Principal | ICD-10-CM

## 2020-04-03 DIAGNOSIS — I10 Essential (primary) hypertension: Principal | ICD-10-CM

## 2020-04-03 DIAGNOSIS — Z96659 Presence of unspecified artificial knee joint: Principal | ICD-10-CM

## 2020-04-03 DIAGNOSIS — Z90722 Acquired absence of ovaries, bilateral: Principal | ICD-10-CM

## 2020-04-03 DIAGNOSIS — Z882 Allergy status to sulfonamides status: Principal | ICD-10-CM

## 2020-04-03 DIAGNOSIS — C55 Malignant neoplasm of uterus, part unspecified: Principal | ICD-10-CM

## 2020-04-03 DIAGNOSIS — N888 Other specified noninflammatory disorders of cervix uteri: Principal | ICD-10-CM

## 2020-04-03 DIAGNOSIS — C5700 Malignant neoplasm of unspecified fallopian tube: Principal | ICD-10-CM

## 2020-04-03 DIAGNOSIS — N83202 Unspecified ovarian cyst, left side: Principal | ICD-10-CM

## 2020-04-03 DIAGNOSIS — G4733 Obstructive sleep apnea (adult) (pediatric): Principal | ICD-10-CM

## 2020-04-03 DIAGNOSIS — C563 Malignant neoplasm of bilateral ovaries: Principal | ICD-10-CM

## 2020-04-03 DIAGNOSIS — Z5111 Encounter for antineoplastic chemotherapy: Principal | ICD-10-CM

## 2020-04-03 MED ORDER — AMOXICILLIN 500 MG TABLET
ORAL_TABLET | Freq: Once | ORAL | 0 refills | 1.00000 days | Status: CP
Start: 2020-04-03 — End: 2021-04-19

## 2020-04-04 MED ORDER — AMOXICILLIN 500 MG CAPSULE
0 days
Start: 2020-04-04 — End: 2020-06-06

## 2020-04-12 MED ORDER — LISINOPRIL 20 MG TABLET
ORAL_TABLET | Freq: Every day | ORAL | 1 refills | 90.00000 days | Status: CN
Start: 2020-04-12 — End: ?

## 2020-04-19 ENCOUNTER — Institutional Professional Consult (permissible substitution): Admit: 2020-04-19 | Discharge: 2020-04-20 | Payer: MEDICARE | Attending: Clinical | Primary: Clinical

## 2020-04-19 DIAGNOSIS — Z0000 Encounter for general adult medical examination without abnormal findings: Principal | ICD-10-CM

## 2020-04-28 MED ORDER — LISINOPRIL 20 MG TABLET
ORAL_TABLET | Freq: Every day | ORAL | 1 refills | 90.00000 days | Status: CN
Start: 2020-04-28 — End: ?

## 2020-05-04 ENCOUNTER — Ambulatory Visit: Admit: 2020-05-04 | Discharge: 2020-05-05 | Payer: MEDICARE

## 2020-05-04 DIAGNOSIS — C5700 Malignant neoplasm of unspecified fallopian tube: Principal | ICD-10-CM

## 2020-05-11 ENCOUNTER — Ambulatory Visit: Admit: 2020-05-11 | Discharge: 2020-05-12 | Payer: MEDICARE

## 2020-05-11 DIAGNOSIS — C5700 Malignant neoplasm of unspecified fallopian tube: Principal | ICD-10-CM

## 2020-05-12 ENCOUNTER — Ambulatory Visit: Admit: 2020-05-12 | Discharge: 2020-05-13 | Payer: MEDICARE

## 2020-05-12 DIAGNOSIS — I10 Essential (primary) hypertension: Principal | ICD-10-CM

## 2020-05-12 DIAGNOSIS — Z79899 Other long term (current) drug therapy: Principal | ICD-10-CM

## 2020-05-15 ENCOUNTER — Ambulatory Visit: Admit: 2020-05-15 | Discharge: 2020-05-16 | Disposition: A | Discharge status: Home / Self Care | Payer: MEDICARE

## 2020-05-15 ENCOUNTER — Emergency Department: Admit: 2020-05-15 | Discharge: 2020-05-16 | Disposition: A | Discharge status: Home / Self Care | Payer: MEDICARE

## 2020-05-15 DIAGNOSIS — M810 Age-related osteoporosis without current pathological fracture: Principal | ICD-10-CM

## 2020-05-15 DIAGNOSIS — M791 Myalgia, unspecified site: Principal | ICD-10-CM

## 2020-05-15 DIAGNOSIS — R0781 Pleurodynia: Principal | ICD-10-CM

## 2020-05-15 DIAGNOSIS — M1990 Unspecified osteoarthritis, unspecified site: Principal | ICD-10-CM

## 2020-05-15 DIAGNOSIS — C799 Secondary malignant neoplasm of unspecified site: Principal | ICD-10-CM

## 2020-05-15 DIAGNOSIS — Z8261 Family history of arthritis: Principal | ICD-10-CM

## 2020-05-15 DIAGNOSIS — Z90722 Acquired absence of ovaries, bilateral: Principal | ICD-10-CM

## 2020-05-15 DIAGNOSIS — M84454A Pathological fracture, pelvis, initial encounter for fracture: Principal | ICD-10-CM

## 2020-05-15 DIAGNOSIS — F909 Attention-deficit hyperactivity disorder, unspecified type: Principal | ICD-10-CM

## 2020-05-15 DIAGNOSIS — I10 Essential (primary) hypertension: Principal | ICD-10-CM

## 2020-05-15 DIAGNOSIS — Z90710 Acquired absence of both cervix and uterus: Principal | ICD-10-CM

## 2020-05-15 DIAGNOSIS — Z79899 Other long term (current) drug therapy: Principal | ICD-10-CM

## 2020-05-15 DIAGNOSIS — R109 Unspecified abdominal pain: Principal | ICD-10-CM

## 2020-05-15 DIAGNOSIS — K769 Liver disease, unspecified: Principal | ICD-10-CM

## 2020-05-15 DIAGNOSIS — C5700 Malignant neoplasm of unspecified fallopian tube: Principal | ICD-10-CM

## 2020-05-15 DIAGNOSIS — Z20822 Contact with and (suspected) exposure to covid-19: Principal | ICD-10-CM

## 2020-05-15 DIAGNOSIS — R10819 Abdominal tenderness, unspecified site: Principal | ICD-10-CM

## 2020-05-15 DIAGNOSIS — F329 Major depressive disorder, single episode, unspecified: Principal | ICD-10-CM

## 2020-05-15 DIAGNOSIS — C569 Malignant neoplasm of unspecified ovary: Principal | ICD-10-CM

## 2020-05-15 DIAGNOSIS — R7989 Other specified abnormal findings of blood chemistry: Principal | ICD-10-CM

## 2020-05-15 DIAGNOSIS — G4761 Periodic limb movement disorder: Principal | ICD-10-CM

## 2020-05-15 DIAGNOSIS — Z7983 Long term (current) use of bisphosphonates: Principal | ICD-10-CM

## 2020-05-15 DIAGNOSIS — Z800 Family history of malignant neoplasm of digestive organs: Principal | ICD-10-CM

## 2020-05-15 DIAGNOSIS — R933 Abnormal findings on diagnostic imaging of other parts of digestive tract: Principal | ICD-10-CM

## 2020-05-15 DIAGNOSIS — Z885 Allergy status to narcotic agent status: Principal | ICD-10-CM

## 2020-05-15 DIAGNOSIS — N1330 Unspecified hydronephrosis: Principal | ICD-10-CM

## 2020-05-15 DIAGNOSIS — R519 Headache, unspecified: Principal | ICD-10-CM

## 2020-05-15 DIAGNOSIS — Z882 Allergy status to sulfonamides status: Principal | ICD-10-CM

## 2020-05-15 DIAGNOSIS — G4733 Obstructive sleep apnea (adult) (pediatric): Principal | ICD-10-CM

## 2020-05-15 DIAGNOSIS — F603 Borderline personality disorder: Principal | ICD-10-CM

## 2020-05-15 MED ORDER — CYCLOBENZAPRINE 10 MG TABLET
ORAL_TABLET | Freq: Three times a day (TID) | ORAL | 0 refills | 10.00000 days | Status: CP | PRN
Start: 2020-05-15 — End: 2020-05-25

## 2020-05-26 ENCOUNTER — Ambulatory Visit: Admit: 2020-05-26 | Discharge: 2020-05-27 | Payer: MEDICARE

## 2020-05-26 DIAGNOSIS — C5700 Malignant neoplasm of unspecified fallopian tube: Principal | ICD-10-CM

## 2020-05-30 ENCOUNTER — Ambulatory Visit: Admit: 2020-05-30 | Discharge: 2020-05-31 | Payer: MEDICARE

## 2020-05-30 DIAGNOSIS — R52 Pain, unspecified: Principal | ICD-10-CM

## 2020-05-30 DIAGNOSIS — C569 Malignant neoplasm of unspecified ovary: Principal | ICD-10-CM

## 2020-06-01 NOTE — Unmapped (Addendum)
GYNECOLOGIC ONCOLOGY RETURN VISIT        ASSESSMENT AND PLAN:  Lisa Burton 75 y.o. 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  She completed 6 cycles of paclitaxel and carboplatin in August 2019.  She was not able to go on the ATHENA trial as everything could not be completed in a timely enough fashion..  Her preoperative CA-125 was 149 and prior to cycle #6 it was 5.6.    She unfortunately suffered a recurrence in 08/25/18 and she is s/p 9 cycles of carboplatin and liposomal doxorubicin. She has subcm disease. We discussed moving forward with niraparib as a maintenance strategy and she wished to proceed. She did well for a period of time but unfortunately has recurrent disease in January 2020. She did have a difficult time with that liposomal doxorubicin and the platinum. In an effort to extend her platinum free interval we did discuss moving forward with Cytoxan, pembrolizumab and bevacizumab. She is interested in this regimen. We will place orders to move forward pending the results of her blood work from today.    The patients ECOG status is 0.    Although this regimen is not yet an NCCN guideline, is is an effective treatment based on the following study: ?? E. Zsiros et al. ??JAMA Oncol. 2021;7(1):78-85.      BRCA negative.    I personally spent 35 minutes face-to-face and non-face-to-face in the care of this patient, which includes all pre, intra, and post visit time on the date of service.      CC:   No chief complaint on file.      HISTORY:  Lisa Burton is a 74 y.o. woman who presented with pelvic mass. She initially presented with abdominal pain and a CT was concerning for fluid collections versus bowel. A repeat image was done 3 months later which was concerning for possible GYN malignancy. It demonstrated trace perihepatic ascites, diffuse omental stranding, and bilateral ovarian cystic lesions suspicious for ovarian carcinoma given there nodularity, loculations and calcifications.     Preop Labs and Imaging:  CT 06/16/17:       The aorta, celiac, SMA, and IMA are patent. There is moderate narrowing at the origin of the celiac and SMA. Trace perihepatic ascites is identified, new from prior examination. Diffuse omental stranding is seen. No pathologically enlarged adenopathy is identified by CT size criteria.    Uterus is anteverted with focal calcifications identified within the right lateral myometrium. Bilateral ovarian cystic lesions are identified. On the left, there is a 3 cm cystic lesion with mural nodularity along the medial wall and focal calcifications along the posterior wall (2:58). On the right, there is a multiloculated 3.6 cm nodule with mildly thickened walls and layering hyperdense material (2:58). Calcifications are also identified along the wall of the lesion. Adjacent loculated fluid is identified posterior to the right ovary with peritoneal thickening along the posterior cul-de-sac.     ??   Impression ?? ??   Findings compatible with ovarian carcinoma with bilateral suspicious appearing ovarian lesions, ascites, and peritoneal thickening, predominantly within the posterior cul-de-sac, concerning for metastatic spread.   ??  06/17/17: CA 125 129  ??  Procedure on 07/01/17: Diagnostic laparoscopy, conversion to exploratory laparotomy, tumor debulking with total hysterectomy, bilateral salpingo-oophorectomy, infragastric omentectomy, Argon beam ablation of tumor nodules, enterolysis  ??  Operative Findings: On diagnostic laparoscopy, Fagotti score 6??(Diaphragmatic disease, Omental disease, Liver disease)  though disease appeared small volume in the upper abdomen and primarily pelvic. ??Ascites present. Optimal debulking was felt feasible. After laparotomy, 6x4cm plaque in the portahepatis noted which was not debulkable (argon laser coagulated). We noted a scant miliary disease along diaphragm, peritoneum, small bowel mesentery (residual). Ovaries densely adherent to multiple adherent redundant loops of rectosigmoid colon (no evidence of obstruction), and posterior uterus. Thin plaque of tumor along rectosigmoid colon and 3x3cm nodule at base of rectosigmoid mesentery(residual). Tumor nodules along the bladder reflection (resected). ??No other disease noted on full abdominopelvic survey. Optimal debulking would have required debulking the porta hepatis, rectosigmoid resection, peritoneal stripping in many locations. IOFS high grade serous ovarian cancer.??R2 resection.  ??  Pathology:  Final Diagnosis   A: Uterus, cervix, bilateral tubes and ovaries, hysterectomy, bilateral salpingo-oophorectomy  ??  Ovaries and fallopian tubes, bilateral:  - Serous carcinoma involving bilateral ovaries  - Serous carcinoma involving surface of right fallopian tube  - Left fallopian tube with serous tubal intraepithelial carcinoma and invasive carcinoma  - FIGO Stage IIIC; see synoptic report   ??  Cervix:   - Benign ectocervix and endocervix with Nabothian cysts and polyp  - No dysplasia or malignancy identified  ??  Endometrium:  - Inactive endometrium.  - No hyperplasia, atypia, or malignancy identified  ??  Myometrium:  - Involved by serous carcinoma  - Benign leiomyomata measuring up to 0.3 cm  ??????????- No atypia, increased mitotic activity or tumor cell necrosis identified  ??  B: Omentum  - Involved by serous carcinoma, multiple nodules, largest 0.6 cm  - No tumor seen in 1 lymph node (0/1)     12/08/17 CT:  Impression     History of ovarian cancer status post total abdominal hysterectomy, bilateral salpingo-oophorectomy and omentectomy with stable peritoneal nodularity along the posterior right liver margin. Otherwise, no new sites of peritoneal disease or new metastases in the abdomen or pelvis.       12/26/17 PET:   Impression     - No evidence of metabolically active foci to suggest recurrent or metastatic disease. Specifically, there was no associated avidity with the posterior right liver margin peritoneal nodules.     She was going to go on the ATHENA trial but everything could not be accomplished in a timely fashion for her to meet the enrollment.    INTERVAL HISTORY:  She had a recurrence in May 2020.  She has been treated on liposomal doxorubicin and carboplatin.  She is status post cycle #9 on June 04, 2019. CA-125 5.8 3/21.    CT 07/09/19:  PERITONEUM/RETROPERITONEUM AND MESENTERY: No free air or fluid. No significant interval change in previously described intraperitoneal implants, for reference:  -0.8 cm implant adjacent to the liver (1:47), unchanged.  -0.6 cm implant adjacent to the vaginal cuff (1:114), previously 0.5 cm.  IMPRESSION:  - Unchanged peritoneal carcinomatosis. No new or enlarging metastatic disease within the abdomen or pelvis.    CA-125 Hgb Plts WBC  3/21  5.8  5/21    11 103 4.4  6/21  4.9  11/19/19   9.7 101 4.3  12/06/19 7  10/21 13  11.3 108 3.9  11/21   10.4 148 4.5  2/22  19  8.3 134 5.2    She comes in today for follow-up.  Based on her labs from February 11, the decision was made to hold her olaparib.     She was apparently in the emergency room in January which is not  something that I was aware of. She never called and we were not notified by the emergency. CT scan metastatic disease with metastases. Information was shared with her today. She is obviously upset wishes to move forward with treatment.    Evaluation is limited in the absence of intravenous contrast. Within the limitations of this study,  ??  -Unremarkable right kidney with no renal or right ureteral calculi. Moderate left hydronephrosis with punctate left pelvic phlebolith versus left distal ureteral calculus. No definite other obstructing lesion seen  -Moderate colonic stool burden and 7.1 cm rectal stool ball with associated mild rectal wall thickening and pericolonic stranding. Findings are concerning for developing stercoral colitis.  -Innumerable hypoattenuating lesions scattered throughout the liver, new since 07/09/2019 and indeterminate however given history of ovarian and fallopian tube cancer, these are concerning for metastatic disease.  -Chronic fracture deformity of the proximal left superior pubic ramus, new since 07/09/2019 and may represent sequelae of pathology fracture.    Past Medical History:   Past Medical History:   Diagnosis Date   ??? ADHD (attention deficit hyperactivity disorder)    ??? Anxiety    ??? At risk for falls    ??? Baker's cyst    ??? Fractures    ??? Hypersomnia    ??? Hypertension    ??? Injury of posterior cruciate ligament    ??? Insomnia    ??? Irritable colon 07/10/2010   ??? Joint pain    ??? Joint replaced    ??? Major depressive disorder    ??? OSA (obstructive sleep apnea)    ??? Osteoarthritis    ??? Osteoporosis    ??? Ovarian cancer (CMS-HCC) 07/03/2017   ??? Personality disorder (CMS-HCC)     Borderline Personality Disorder   ??? PLMD (periodic limb movement disorder)    ??? Tear of meniscus of knee    ??? Urge incontinence 11/12/2012   ??? Vestibular dysfunction 09/16/2013     Past Surgical History:   Past Surgical History:   Procedure Laterality Date   ??? CESAREAN SECTION  1981   ??? FINGER FRACTURE SURGERY Right 10/2013    thumb   ??? FOOT SURGERY Bilateral 9/00, 01/14    hardware   ??? IR INSERT PORT AGE GREATER THAN 5 YRS  07/04/2017    IR INSERT PORT AGE GREATER THAN 5 YRS 07/04/2017 [name], MD IMG [facility]IR [facility]   ??? PR BIL SALP-OOPH W/OMENTECT,TAH,RAD DISSECT Bilateral 07/01/2017    Procedure: BILATERAL SALPINGO-OOPHORECTOMY W/OMENTECTOMY, TOTAL ABD HYSTERECTOMY & RADICAL DISSECTION FOR DEBULKING;  Surgeon: Ermelinda Das, MD;  Location: MAIN OR [facility];  Service: Gynecology Oncology   ??? PR LAP,DIAGNOSTIC ABDOMEN N/A 07/01/2017    Procedure: Laparoscopy, Abdomen, Peritoneum, & Omentum, Diagnostic, W/Wo Collection Specimen(S) By Brushing Or Washing;  Surgeon: Ermelinda Das, MD;  Location: MAIN [facility];  Service: Gynecology Oncology   ??? 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 TONSILLECTOMY  1970   ??? WRIST FRACTURE SURGERY Right 1998    wrist     Oncology History:   Oncology History   Fallopian tube cancer, carcinoma (CMS-HCC)   06/17/2017 Initial Diagnosis    CT:  Findings compatible with ovarian carcinoma with bilateral suspicious appearing ovarian lesions, ascites, and peritoneal thickening, predominantly within the posterior cul-de-sac, concerning for metastatic spread.   ??  06/17/17: CA 125 129     07/01/2017 Surgery    Procedure on 07/01/17: Diagnostic laparoscopy, conversion to exploratory laparotomy, tumor debulking with total hysterectomy, bilateral  salpingo-oophorectomy, infragastric omentectomy, Argon beam ablation of tumor nodules, enterolysis    Operative Findings: On diagnostic laparoscopy, Fagotti score 6 (Diaphragmatic disease, Omental disease, Liver disease) though disease appeared small volume in the upper abdomen and primarily pelvic.  Ascites present. Optimal debulking was felt feasible. After laparotomy, 6x4cm plaque in the portahepatis noted which was not debulkable (argon laser coagulated). We noted a scant miliary disease along diaphragm, peritoneum, small bowel mesentery (residual). Ovaries densely adherent to multiple adherent redundant loops of rectosigmoid colon (no evidence of obstruction), and posterior uterus. Thin plaque of tumor along rectosigmoid colon and 3x3cm nodule at base of rectosigmoid mesentery(residual). Tumor nodules along the bladder reflection (resected).  No other disease noted on full abdominopelvic survey. Optimal debulking would have required debulking the porta hepatis, rectosigmoid resection, peritoneal stripping in many locations. 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 Consideration of bevacizumab versus enrollment in ATHENA trial as maintenance. Referral to genetics. BRCA immunostaining pending  ??     07/18/2017 - 11/17/2017 Chemotherapy    Chemotherapy Treatment    Treatment Goal Curative   Line of Treatment First Line   Plan Name OP OVARIAN PACLITAXEL/CARBOPLATIN   Start Date 07/21/2017   End Date 11/17/2017   Provider Ermelinda Das, MD   Chemotherapy dexamethasone (DECADRON) tablet 12 mg, 12 mg, Oral, Once, 6 of 6 cycles  Administration: 12 mg (07/21/2017), 12 mg (08/11/2017), 12 mg (09/05/2017), 12 mg (09/29/2017), 12 mg (10/28/2017), 12 mg (11/17/2017)  CARBOplatin (PARAPLATIN) 580.8 mg in sodium chloride (NS) 0.9 % 100 mL IVPB, 580.8 mg (114.2 % of original dose 508.8 mg), Intravenous, Once, 6 of 6 cycles  Dose modification:   (original dose 508.8 mg, Cycle 1)  Administration: 580.8 mg (07/21/2017), 528.6 mg (08/11/2017), 517.8 mg (09/05/2017), 529.8 mg (09/29/2017), 493.2 mg (10/28/2017), 498 mg (11/17/2017)  PACLItaxel (TAXOL) 286.98 mg in sodium chloride 0.9% NON-PVC (NS) 0.9 % 500 mL IVPB, 175 mg/m2 = 286.98 mg, Intravenous, Once, 6 of 6 cycles  Administration: 286.98 mg (07/21/2017), 285.24 mg (08/11/2017), 276.48 mg (09/05/2017), 278.28 mg (09/29/2017), 281.76 mg (10/28/2017), 283.5 mg (11/17/2017)          01/16/2018 Genetics    [facility] panel. Genetic testing is negative.     08/25/2018 Recurrence    Elevated CA-125 and imaging consistent with recurrent disease.       09/04/2018 - 06/07/2019 Chemotherapy    OP OVARIAN DOXORUBICIN LIPOSOMAL/CARBOPLATIN  DOXOrubicin Liposome 30 mg/m2 IV on day 1, CARBOplatin AUC 5 IV on day 1, every 28 days       08/20/2019 - 03/13/2020 Chemotherapy    niraparib     04/20/2020 - 05/26/2020 Chemotherapy    olaparib     05/15/2020 Recurrence    + ct in ER with liver and peritoneal metastasis. We were not aware of the scan until 06/05/20.         Family History:   Family History   Problem Relation Age of Onset   ??? Personality disorder Sister    ??? Cancer Father         GI cancer 13's   ??? Arthritis Father    ??? Depression Son    ??? Arthritis Sister    ??? Arthritis Mother    ??? ADD / ADHD Neg Hx ??? Alcohol abuse Neg Hx    ??? Anxiety disorder Neg Hx    ???  Bipolar disorder Neg Hx    ??? Dementia Neg Hx    ??? Drug abuse Neg Hx    ??? OCD Neg Hx    ??? Paranoid behavior Neg Hx    ??? Physical abuse Neg Hx    ??? Schizophrenia Neg Hx    ??? Seizures Neg Hx    ??? Sexual abuse Neg Hx    ??? Sleep disorder Neg Hx      Social History:   Social History     Socioeconomic History   ??? Marital status: Married     Spouse name: Not on file   ??? Number of children: 1   ??? Years of education: Not on file   ??? Highest education level: Not on file   Occupational History   ??? Occupation: retired IT trainer   Tobacco Use   ??? Smoking status: Never Smoker   ??? Smokeless tobacco: Never Used   Vaping Use   ??? Vaping Use: Never used   Substance and Sexual Activity   ??? Alcohol use: Yes     Alcohol/week: 1.0 - 2.0 standard drink     Types: 1 - 2 Shots of liquor per week   ??? Drug use: No   ??? Sexual activity: Not on file   Other Topics Concern   ??? 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  Partner is somewhat older in 23's and they live together in [place]. She has a son who is a Clinical research associate, and does litigation in [place].  He went to [employer].  She also has a granddaughter only 44 year old in 2015.    12/10/16        [employer] Components:        Family, social, cultural characteristics: social support includes  Odiss MCGrew her partner .      Patient has the following communication needs: none, per patient     Health Literacy: How confident are you that you understand your health issues/concerns, can participate in your care, and manage your care along with your physician: confident.    Behaviors Affecting Health: none, per patient    Family history of mental health illness and/or substance abuse: mental health illness: yes, as noted in family history. Sister with personality disorder    Have you been seen by any medical provider that we have not referred you to since your last visit ? No    Discussed a Living Will with the patient and the : Patient interested in education materials about a living will. Materials provided at today's visit.      Social Determinants of Health     Financial Resource Strain: Low Risk    ??? Difficulty of Paying Living Expenses: Not hard at all   Food Insecurity: No Food Insecurity   ??? Worried About Programme researcher, broadcasting/film/video in the Last Year: Never true   ??? Ran Out of Food in the Last Year: Never true   Transportation Needs: No Transportation Needs   ??? Lack of Transportation (Medical): No   ??? Lack of Transportation (Non-Medical): No   Physical Activity: Unknown   ??? Days of Exercise per Week: 0 days   ??? Minutes of Exercise per Session: Not on file   Stress: No Stress Concern Present   ??? Feeling of Stress : Not at all  Social Connections: Socially Integrated   ??? Frequency of Communication with Friends and Family: More than three times a week   ??? Frequency of Social Gatherings with Friends and Family: More than three times a week   ??? Attends Religious Services: More than 4 times per year   ??? Active Member of Clubs or Organizations: Yes   ??? Attends Banker Meetings: More than 4 times per year   ??? Marital Status: Married     Allergy:   Allergies   Allergen Reactions   ??? Codeine Rash   ??? Sulfa (Sulfonamide Antibiotics) Rash and Nausea Only       CURRENT MEDICATIONS:  Outpatient Encounter Medications as of 06/05/2020   Medication Sig Dispense Refill   ??? acetaminophen (TYLENOL) 325 MG tablet Take 2 tablets (650 mg total) by mouth Every six (6) hours. 100 tablet 2   ??? alendronate (FOSAMAX) 70 MG tablet TAKE 1 TABLET BY MOUTH EVERY 7 DAYS 12 tablet 0   ??? amoxicillin (AMOXIL) 500 MG capsule      ??? amoxicillin (AMOXIL) 500 MG tablet Take 4 tablets (2,000 mg total) by mouth once for 1 dose. Prior to dental cleaning 4 tablet 0   ??? ARIPiprazole (ABILIFY) 10 MG tablet Take 10 mg by mouth daily.     ??? ascorbic acid, vitamin C, (VITAMIN C) 1000 MG tablet Take 1,000 mg by mouth daily.     ??? atorvastatin (LIPITOR) 10 MG tablet Take 1 tablet (10 mg total) by mouth nightly. 90 tablet 3   ??? CALCIUM ORAL Take by mouth daily.     ??? cholecalciferol, vitamin D3, (VITAMIN D3) 1,000 unit capsule Take 1,000 Units by mouth daily.     ??? [EXPIRED] cyclobenzaprine (FLEXERIL) 10 MG tablet Take 1 tablet (10 mg total) by mouth Three (3) times a day as needed for muscle spasms for up to 10 days. 30 tablet 0   ??? latanoprost (XALATAN) 0.005 % ophthalmic solution      ??? levomefol-B6-meB12-algal oil (METANX, ALGAL OIL,) 3 mg-35 mg-2 mg -90.314 mg capsule Take 2 capsules by mouth daily. 60 capsule 6   ??? lisinopriL (PRINIVIL,ZESTRIL) 20 MG tablet Take 1 tablet (20 mg total) by mouth daily. 90 tablet 1   ??? olaparib (LYNPARZA) 150 mg tablet Take 2 tablets (300 mg total) by mouth Two (2) times a day. 120 tablet 5   ??? ondansetron (ZOFRAN) 8 MG tablet Take 1 tablet (8 mg total) by mouth every eight (8) hours as needed for nausea. 30 tablet 2   ??? oxybutynin (DITROPAN) 5 MG tablet Take 1 tablet by mouth 4 times daily 360 tablet 0   ??? sodium fluoride-pot nitrate 1.1-5 % Pste BRUSH WITH A PEA SIZED AMOUNT TWICE A DAY FOR 2 MINUTES     ??? traZODone (DESYREL) 100 MG tablet Take 100 mg by mouth nightly.      ??? venlafaxine (EFFEXOR-XR) 150 MG 24 hr capsule Take 2 capsules (300 mg total) by mouth every morning. 180 capsule 3     Facility-Administered Encounter Medications as of 06/05/2020   Medication Dose Route Frequency Provider Last Rate Last Admin   ??? heparin, porcine (PF) 100 unit/mL injection 500 Units  500 Units Intravenous Q30 Min PRN Ermelinda Das, MD   500 Units at 06/05/20 1330       PHYSICAL EXAM:  BP 141/70  - Pulse 99  - Temp 36.4 ??C (97.5 ??F) (Temporal)  - Ht 154.9 cm (5' 1)  - Wt  56.8 kg (125 lb 3.2 oz)  - SpO2 100%  - BMI 23.66 kg/m??     General: Alert, oriented, no acute distress.    LABORATORY AND RADIOLOGIC STUDIES:    CA-125, prechemotherapy labs

## 2020-06-05 ENCOUNTER — Ambulatory Visit
Admit: 2020-06-05 | Discharge: 2020-06-06 | Payer: MEDICARE | Attending: Gynecologic Oncology | Primary: Gynecologic Oncology

## 2020-06-05 ENCOUNTER — Ambulatory Visit: Admit: 2020-06-05 | Discharge: 2020-06-06 | Payer: MEDICARE

## 2020-06-05 DIAGNOSIS — C5700 Malignant neoplasm of unspecified fallopian tube: Principal | ICD-10-CM

## 2020-06-05 DIAGNOSIS — Z5111 Encounter for antineoplastic chemotherapy: Principal | ICD-10-CM

## 2020-06-05 DIAGNOSIS — E038 Other specified hypothyroidism: Principal | ICD-10-CM

## 2020-06-06 ENCOUNTER — Ambulatory Visit: Admit: 2020-06-06 | Discharge: 2020-06-07 | Payer: MEDICARE

## 2020-06-06 DIAGNOSIS — C5700 Malignant neoplasm of unspecified fallopian tube: Principal | ICD-10-CM

## 2020-06-06 MED ORDER — CYCLOPHOSPHAMIDE 50 MG CAPSULE
ORAL_CAPSULE | Freq: Every day | ORAL | 5 refills | 30 days | Status: CP
Start: 2020-06-06 — End: ?
  Filled 2020-06-09: qty 30, 30d supply, fill #0

## 2020-06-07 ENCOUNTER — Ambulatory Visit
Admit: 2020-06-07 | Discharge: 2020-06-08 | Payer: MEDICARE | Attending: Nurse Practitioner | Primary: Nurse Practitioner

## 2020-06-07 DIAGNOSIS — R252 Cramp and spasm: Principal | ICD-10-CM

## 2020-06-08 NOTE — Unmapped (Signed)
Progress Note    Name:  Lisa Burton  DOB: 1945/09/18  Today's Date: 06/08/2020  Age:  75 y.o.    Assessment/Plan:   Diagnoses and all orders for this visit:    Leg cramps  Consult with Dr. Tania Ade who advised we add Mg level to previous labs. She also suggested pt can drink a glass of G2 Gatorade prior to bed. Will add Mg supplement prn. Pt is also advised to improve her hydration.   -     Magnesium Level; Future       HPI:  Lisa Burton is a 75 y.o. female presents for c/o severe B LE leg cramps in her calves and hamstrings for about 3 weeks. Symptoms occur during day but are worse at HS; pain and weakness are causing her to fall at home. Pt reports pain is severe and her husband cant even touch her legs to help her. On 06/06/20 PVL venous duplex lower extremity bilateral was neg for DVT. Pt reports she has been in bed for a few weeks due to not feeling well; her appetite is decreased and she drinks soda during day. Labs from 06/05/20 show anemia, slightly low sodium, decreased renal fx. 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     Review of Systems    Allergies   Allergen Reactions   ??? Codeine Rash   ??? Sulfa (Sulfonamide Antibiotics) Rash and Nausea Only         Current Outpatient Medications:   ???  atorvastatin (LIPITOR) 10 MG tablet, Take 1 tablet (10 mg total) by mouth nightly., Disp: 90 tablet, Rfl: 3  ???  lisinopriL (PRINIVIL,ZESTRIL) 20 MG tablet, Take 1 tablet (20 mg total) by mouth daily., Disp: 90 tablet, Rfl: 1  ???  ondansetron (ZOFRAN) 8 MG tablet, Take 1 tablet (8 mg total) by mouth every eight (8) hours as needed for nausea., Disp: 30 tablet, Rfl: 2  ???  oxybutynin (DITROPAN) 5 MG tablet, Take 1 tablet by mouth 4 times daily, Disp: 360 tablet, Rfl: 0  ???  sodium fluoride-pot nitrate 1.1-5 % Pste, BRUSH WITH A PEA SIZED AMOUNT TWICE A DAY FOR 2 MINUTES, Disp: , Rfl:   ???  traZODone (DESYREL) 100 MG tablet, Take 100 mg by mouth nightly. , Disp: , Rfl:   ???  venlafaxine (EFFEXOR-XR) 150 MG 24 hr capsule, Take 2 capsules (300 mg total) by mouth every morning., Disp: 180 capsule, Rfl: 3  ???  acetaminophen (TYLENOL) 325 MG tablet, Take 2 tablets (650 mg total) by mouth Every six (6) hours. (Patient not taking: Reported on 06/07/2020), Disp: 100 tablet, Rfl: 2  ???  alendronate (FOSAMAX) 70 MG tablet, TAKE 1 TABLET BY MOUTH EVERY 7 DAYS (Patient not taking: Reported on 06/07/2020), Disp: 12 tablet, Rfl: 0  ???  amoxicillin (AMOXIL) 500 MG tablet, Take 4 tablets (2,000 mg total) by mouth once for 1 dose. Prior to dental cleaning (Patient not taking: Reported on 06/07/2020), Disp: 4 tablet, Rfl: 0  ???  ARIPiprazole (ABILIFY) 10 MG tablet, Take 10 mg by mouth daily. (Patient not taking: Reported on 06/07/2020), Disp: , Rfl:   ???  ascorbic acid, vitamin C, (VITAMIN C) 1000 MG tablet, Take 1,000 mg by mouth daily. (Patient not taking: Reported on 06/07/2020), Disp: , Rfl:   ???  CALCIUM ORAL, Take by mouth daily. (Patient not taking: Reported on 06/07/2020), Disp: , Rfl:   ???  cholecalciferol, vitamin D3, (VITAMIN D3) 1,000 unit capsule, Take  1,000 Units by mouth daily. (Patient not taking: Reported on 06/07/2020), Disp: , Rfl:   ???  cyclophosphamide (CYTOXAN) 50 mg capsule, Take 1 capsule (50 mg total) by mouth daily . (Patient not taking: Reported on 06/07/2020), Disp: 30 capsule, Rfl: 5  ???  latanoprost (XALATAN) 0.005 % ophthalmic solution, , Disp: , Rfl:   ???  levomefol-B6-meB12-algal oil (METANX, ALGAL OIL,) 3 mg-35 mg-2 mg -90.314 mg capsule, Take 2 capsules by mouth daily. (Patient not taking: Reported on 06/07/2020), Disp: 60 capsule, Rfl: 6    Past Medical History:   Diagnosis Date   ??? ADHD (attention deficit hyperactivity disorder)    ??? Anxiety    ??? At risk for falls    ??? Baker's cyst    ??? Fractures    ??? Hypersomnia    ??? Hypertension    ??? Injury of posterior cruciate ligament    ??? Insomnia    ??? Irritable colon 07/10/2010   ??? Joint pain    ??? Joint replaced    ??? Major depressive disorder    ??? OSA (obstructive sleep apnea)    ??? Osteoarthritis    ??? Osteoporosis    ??? Ovarian cancer (CMS-HCC) 07/03/2017   ??? Personality disorder (CMS-HCC)     Borderline Personality Disorder   ??? PLMD (periodic limb movement disorder)    ??? Tear of meniscus of knee    ??? Urge incontinence 11/12/2012   ??? Vestibular dysfunction 09/16/2013       Physical Examination:  BP 120/60  - Pulse 78  - Temp 36 ??C (96.8 ??F) (Temporal)  - Resp 18  - Wt 56.7 kg (125 lb)  - SpO2 95%  - BMI 23.62 kg/m??     Physical Exam  Vitals reviewed.   Constitutional:       Comments: Chronically ill appearing   Cardiovascular:      Pulses: Normal pulses.   Musculoskeletal:         General: Tenderness (bilateral calf tenderness, no mass or infection noted) present. No swelling or deformity. Normal range of motion.      Right lower leg: No edema.      Left lower leg: No edema.   Skin:     General: Skin is warm and dry.      Findings: No erythema or rash.   Neurological:      Sensory: No sensory deficit.      Comments: Ambulates with cane. Generalized B LE weakness.   I did not have pt get onto exam table for evaluation. She was examined while sitting in the chair.                  Labs:  No results found for this or any previous visit (from the past 24 hour(s)).    Gilford Silvius, NP  06/08/2020

## 2020-06-08 NOTE — Unmapped (Signed)
[facility] Specialty Medication Onboarding    Specialty Medication: Cyclophosphamide 50mg  capsule  Prior Authorization: Not Required   Financial Assistance: No - copay  <$25  Final Copay/Day Supply: $0 / 30 days    Insurance Restrictions: None     Notes to Pharmacist:     The triage team has completed the benefits investigation and has determined that the patient is able to fill this medication at [facility]. Please contact the patient to complete the onboarding or follow up with the prescribing physician as needed.

## 2020-06-09 NOTE — Unmapped (Signed)
[facility]   Patient Onboarding/Medication Counseling    Lisa Burton is a 75 y.o. female with fallopian tube carcinoma who I am counseling today on initiation of therapy.  I am speaking to the patient.    Was a Nurse, learning disability used for this call? No    Verified patient's date of birth / HIPAA.    Specialty medication(s) to be sent: Hematology/Oncology: Cyclophosphamide      Non-specialty medications/supplies to be sent: n/a      Medications not needed at this time: n/a           Cytoxan (cyclophosphamide)    Medication & Administration     Dosage: Take 50mg  (1 capsule) daily    Administration: I reviewed the importance of taking with a full glass of water and if taken with food may decrease nausea. Discussed that capsule should be swallowed whole and do not chew, open, break or crush the capsule.    Adherence/Missed dose instruction: If a dose is missed, do not take an extra dose or two doses at one time. Simply take your next dose at the regularly scheduled time and record any missed doses so our team is aware.    Goals of Therapy     Prevent disease progression    Side Effects & Monitoring Parameters   ??? Nausea/vomiting  ??? Diarrhea  ??? Infection precautions  ??? Fatigue  ??? Mouth sores  ??? Hair thinning/loss  ??? Sun precautions  ??? Decrease appetite/ taste changes  ??? Bleeding precautions (bruising easily, nose bleeds, gums bleed)  ??? Nail changes (brittle)  ??? Hemorrhagic cystitis    The following side effects should be reported to the provider:  ??? Heartbeat that doesn't feel normal (heart feels like it's racing, skipping a beat or fluttering)  ??? Decrease in urination, change in color of urine, blood in urine  ??? Dark urine  or yellow skin or eyes  ??? Diarrhea not controlled by anti-diarrheals  ??? Mouth irritation or mouth sores  ??? Signs of allergic reaction (rash, hives, shortness of breath)  ??? Signs of infection (fever >100.5, chills, sore throat)    Warnings & Precautions     ??? Mouth sores-discussed use of baking soda/salt water rinses  ??? Importance of hydration if having frequent diarrhea  ??? Importance of good nutrition to help minimize diarrhea (high protein, BRAT, yogurt, avoid greasy and spicy foods)  ??? Exposure of an unborn child to this medication could cause birth defects so you should not become pregnant or father a child while on this medication and for 1 year after treatment for women and 4 months after treatment for men.    Drug/Food Interactions     ??? Medication list reviewed in Epic. The patient was instructed to inform the care team before taking any new medications or supplements. No drug interactions identified.   ??? Avoid live vaccines.    Storage, Handling Precautions, & Disposal     ??? This medication should be stored at room temperature and in a dry location. Keep out of reach of others including children and pets. Keep the medicine in the original container with a child-proof top (no pillboxes). Do not throw away or flush unused medication down the toilet or sink. This drug is considered hazardous and should be handled as little as possible.  If someone else helps with medication administration, they should wear gloves.      Current Medications (including OTC/herbals), Comorbidities and Allergies     Current  Outpatient Medications   Medication Sig Dispense Refill   ??? acetaminophen (TYLENOL) 325 MG tablet Take 2 tablets (650 mg total) by mouth Every six (6) hours. (Patient not taking: Reported on 06/07/2020) 100 tablet 2   ??? alendronate (FOSAMAX) 70 MG tablet TAKE 1 TABLET BY MOUTH EVERY 7 DAYS (Patient not taking: Reported on 06/07/2020) 12 tablet 0   ??? amoxicillin (AMOXIL) 500 MG tablet Take 4 tablets (2,000 mg total) by mouth once for 1 dose. Prior to dental cleaning (Patient not taking: Reported on 06/07/2020) 4 tablet 0   ??? ARIPiprazole (ABILIFY) 10 MG tablet Take 10 mg by mouth daily. (Patient not taking: Reported on 06/07/2020)     ??? ascorbic acid, vitamin C, (VITAMIN C) 1000 MG tablet Take 1,000 mg by mouth daily. (Patient not taking: Reported on 06/07/2020)     ??? atorvastatin (LIPITOR) 10 MG tablet Take 1 tablet (10 mg total) by mouth nightly. 90 tablet 3   ??? CALCIUM ORAL Take by mouth daily. (Patient not taking: Reported on 06/07/2020)     ??? cholecalciferol, vitamin D3, (VITAMIN D3) 1,000 unit capsule Take 1,000 Units by mouth daily. (Patient not taking: Reported on 06/07/2020)     ??? cyclophosphamide (CYTOXAN) 50 mg capsule Take 1 capsule (50 mg total) by mouth daily . (Patient not taking: Reported on 06/07/2020) 30 capsule 5   ??? latanoprost (XALATAN) 0.005 % ophthalmic solution  (Patient not taking: Reported on 06/07/2020)     ??? levomefol-B6-meB12-algal oil (METANX, ALGAL OIL,) 3 mg-35 mg-2 mg -90.314 mg capsule Take 2 capsules by mouth daily. (Patient not taking: Reported on 06/07/2020) 60 capsule 6   ??? lisinopriL (PRINIVIL,ZESTRIL) 20 MG tablet Take 1 tablet (20 mg total) by mouth daily. 90 tablet 1   ??? ondansetron (ZOFRAN) 8 MG tablet Take 1 tablet (8 mg total) by mouth every eight (8) hours as needed for nausea. 30 tablet 2   ??? oxybutynin (DITROPAN) 5 MG tablet Take 1 tablet by mouth 4 times daily 360 tablet 0   ??? sodium fluoride-pot nitrate 1.1-5 % Pste BRUSH WITH A PEA SIZED AMOUNT TWICE A DAY FOR 2 MINUTES     ??? traZODone (DESYREL) 100 MG tablet Take 100 mg by mouth nightly.      ??? venlafaxine (EFFEXOR-XR) 150 MG 24 hr capsule Take 2 capsules (300 mg total) by mouth every morning. 180 capsule 3     No current facility-administered medications for this visit.       Allergies   Allergen Reactions   ??? Codeine Rash   ??? Sulfa (Sulfonamide Antibiotics) Rash and Nausea Only       Patient Active Problem List   Diagnosis   ??? Osteoporosis   ??? Hammertoe - surgery Spring 2014   ??? Major depressive disorder   ??? Anxiety   ??? Borderline personality disorder (CMS-HCC)   ??? Rib pain on left side   ??? Restless legs syndrome   ??? Sleep apnea   ??? Irritable colon   ??? Seborrheic keratosis   ??? Periodic limb movement disorder ??? Benign essential HTN   ??? Knee joint replacement status   ??? Urge incontinence   ??? Piriformis muscle pain - intermittent   ??? Left lumbosacral radiculopathy   ??? Sacroiliac joint dysfunction of left side   ??? Vestibular dysfunction   ??? Falls frequently   ??? OSA on CPAP   ??? Left hip pain   ??? Encounter for long-term current use of medication   ??? Vitamin D  deficiency   ??? Transient alteration of awareness   ??? Fallopian tube cancer, carcinoma (CMS-HCC)   ??? Encounter for antineoplastic chemotherapy   ??? Anemia associated with chemotherapy   ??? Pleuritic chest pain   ??? ADHD (attention deficit hyperactivity disorder), combined type   ??? 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 Closed fracture of shaft of metacarpal bone   ??? Claw toe, acquired   ??? Enthesopathy of ankle and tarsus   ??? Enthesopathy of hip region   ??? Hallux valgus, acquired   ??? Pain in joint, ankle and foot   ??? Hand joint pain   ??? Hammer toe, acquired   ??? Bunion   ??? Sprain of metacarpophalangeal joint   ??? Swelling, mass, or lump in head and neck   ??? Sprain of knee   ??? Primary localized osteoarthrosis, lower leg   ??? Posterior vitreous detachment of both eyes   ??? Osteoarthritis of knee   ??? Muscle weakness   ??? Macular hole, right eye   ??? Internal hemorrhoids   ??? History of cervical dysplasia   ??? Other specified hypothyroidism       Reviewed and up to date in Epic.    Appropriateness of Therapy     Is medication and dose appropriate based on diagnosis? Yes    Prescription has been clinically reviewed: Yes    Baseline Quality of Life Assessment      How many days over the past month did your condition  keep you from your normal activities? For example, brushing your teeth or getting up in the morning. 0    Financial Information     Medication Assistance provided: None Required    Anticipated copay of $0 reviewed with patient. Verified delivery address.    Delivery Information     Scheduled delivery date: 06/09/20    Expected start date: 06/12/20    Medication will be delivered via Same Day Courier to the prescription address in [facility].  This shipment will require a signature.      Explained the services we provide at [facility] and that each month we would call to set up refills.  Stressed importance of returning phone calls so that we could ensure they receive their medications in time each month.  Informed patient that we should be setting up refills 7-10 days prior to when they will run out of medication.  A pharmacist will reach out to perform a clinical assessment periodically.  Informed patient that a welcome packet, containing information about our pharmacy and other support services, a Notice of Privacy Practices, and a drug information handout will be sent.      Patient verbalized understanding of the above information as well as how to contact the pharmacy at [phone] option [phone] with any questions/concerns.  The pharmacy is open Monday through Friday 8:30am-4:30pm.  A pharmacist is available 24/7 via pager to answer any clinical questions they may have.    Patient Specific Needs     - Does the patient have any physical, cognitive, or cultural barriers? No    - Patient prefers to have medications discussed with  Patient     - Is the patient or caregiver able to read and understand education materials at a high school level or above? Yes    - Patient's primary language is  English     - Is the patient high risk? Yes, patient is taking oral chemotherapy. Appropriateness  of therapy as been assessed    - Does the patient require a Care Management Plan? No     - Does the patient require physician intervention or other additional services (i.e. nutrition, smoking cessation, social work)? No      Florene Route [facility] Specialty Pharmacist

## 2020-06-09 NOTE — Unmapped (Signed)
Pt. contacted and voiced understanding of the message.  Dr. Cathey Endow said she agreed.

## 2020-06-09 NOTE — Unmapped (Signed)
Please notify pt that her Magnesium level is just below normal. She can take OTC Magnesium supplement of 400 mg daily. I will include PCP on this to make sure she agrees. Thanks, [employer]

## 2020-06-12 ENCOUNTER — Ambulatory Visit: Admit: 2020-06-12 | Discharge: 2020-06-13 | Payer: MEDICARE

## 2020-06-12 ENCOUNTER — Other Ambulatory Visit: Admit: 2020-06-12 | Discharge: 2020-06-13 | Payer: MEDICARE

## 2020-06-12 DIAGNOSIS — Z5111 Encounter for antineoplastic chemotherapy: Principal | ICD-10-CM

## 2020-06-12 DIAGNOSIS — E038 Other specified hypothyroidism: Principal | ICD-10-CM

## 2020-06-12 DIAGNOSIS — C5700 Malignant neoplasm of unspecified fallopian tube: Principal | ICD-10-CM

## 2020-06-12 LAB — COMPREHENSIVE METABOLIC PANEL
ALBUMIN: 3.1 g/dL — ABNORMAL LOW (ref 3.4–5.0)
ALKALINE PHOSPHATASE: 350 U/L — ABNORMAL HIGH (ref 46–116)
ALT (SGPT): 65 U/L — ABNORMAL HIGH (ref 10–49)
ANION GAP: 9 mmol/L (ref 5–14)
AST (SGOT): 72 U/L — ABNORMAL HIGH (ref <=34)
BILIRUBIN TOTAL: 0.4 mg/dL (ref 0.3–1.2)
BLOOD UREA NITROGEN: 31 mg/dL — ABNORMAL HIGH (ref 9–23)
BUN / CREAT RATIO: 18
CALCIUM: 8.8 mg/dL (ref 8.7–10.4)
CHLORIDE: 104 mmol/L (ref 98–107)
CO2: 21 mmol/L (ref 20.0–31.0)
CREATININE: 1.73 mg/dL — ABNORMAL HIGH
EGFR CKD-EPI AA FEMALE: 33 mL/min/{1.73_m2} — ABNORMAL LOW (ref >=60)
EGFR CKD-EPI NON-AA FEMALE: 29 mL/min/{1.73_m2} — ABNORMAL LOW (ref >=60)
GLUCOSE RANDOM: 119 mg/dL (ref 70–179)
POTASSIUM: 3.9 mmol/L (ref 3.4–4.8)
PROTEIN TOTAL: 6.1 g/dL (ref 5.7–8.2)
SODIUM: 134 mmol/L — ABNORMAL LOW (ref 135–145)

## 2020-06-12 LAB — CBC W/ AUTO DIFF
BASOPHILS ABSOLUTE COUNT: 0 10*9/L (ref 0.0–0.1)
BASOPHILS RELATIVE PERCENT: 0.2 %
EOSINOPHILS ABSOLUTE COUNT: 0.1 10*9/L (ref 0.0–0.4)
EOSINOPHILS RELATIVE PERCENT: 0.9 %
HEMATOCRIT: 25.2 % — ABNORMAL LOW (ref 36.0–46.0)
HEMOGLOBIN: 8.6 g/dL — ABNORMAL LOW (ref 12.0–16.0)
LARGE UNSTAINED CELLS: 2 % (ref 0–4)
LYMPHOCYTES ABSOLUTE COUNT: 0.6 10*9/L — ABNORMAL LOW (ref 1.5–5.0)
LYMPHOCYTES RELATIVE PERCENT: 10.1 %
MEAN CORPUSCULAR HEMOGLOBIN CONC: 34.1 g/dL (ref 31.0–37.0)
MEAN CORPUSCULAR HEMOGLOBIN: 38.6 pg — ABNORMAL HIGH (ref 26.0–34.0)
MEAN CORPUSCULAR VOLUME: 113.3 fL — ABNORMAL HIGH (ref 80.0–100.0)
MEAN PLATELET VOLUME: 9.2 fL (ref 7.0–10.0)
MONOCYTES ABSOLUTE COUNT: 0.6 10*9/L (ref 0.2–0.8)
MONOCYTES RELATIVE PERCENT: 10 %
NEUTROPHILS ABSOLUTE COUNT: 4.4 10*9/L (ref 2.0–7.5)
NEUTROPHILS RELATIVE PERCENT: 77 %
PLATELET COUNT: 191 10*9/L (ref 150–440)
RED BLOOD CELL COUNT: 2.23 10*12/L — ABNORMAL LOW (ref 4.00–5.20)
RED CELL DISTRIBUTION WIDTH: 15.7 % — ABNORMAL HIGH (ref 12.0–15.0)
WBC ADJUSTED: 5.7 10*9/L (ref 4.5–11.0)

## 2020-06-12 LAB — TSH: THYROID STIMULATING HORMONE: 0.261 u[IU]/mL — ABNORMAL LOW (ref 0.550–4.780)

## 2020-06-12 LAB — T4, FREE: FREE T4: 1.28 ng/dL (ref 0.89–1.76)

## 2020-06-12 MED ADMIN — pembrolizumab (KEYTRUDA) 200 mg in sodium chloride (NS) 0.9 % 50 mL IVPB: 200 mg | INTRAVENOUS | @ 19:00:00 | Stop: 2020-06-12

## 2020-06-12 MED ADMIN — sodium chloride (NS) 0.9 % infusion: 100 mL/h | INTRAVENOUS | @ 19:00:00

## 2020-06-12 MED ADMIN — heparin, porcine (PF) 100 unit/mL injection 500 Units: 500 [IU] | INTRAVENOUS | @ 20:00:00 | Stop: 2020-06-13

## 2020-06-12 MED ADMIN — bevacizumab-awwb (MVASI) 852 mg in sodium chloride (NS) 0.9 % 100 mL IVPB: 15 mg/kg | INTRAVENOUS | @ 19:00:00 | Stop: 2020-06-12

## 2020-06-12 NOTE — Unmapped (Unsigned)
Port accessed and labs drawn with no complications by Adam A.  Port flushed with saline.

## 2020-06-12 NOTE — Unmapped (Signed)
Patient received bevacizumab and pembrolizumab infusions according to treatment plan protocol. Her port was flushed, heaprin locked, and de-accessed. She was provided an AVS and discharged.

## 2020-06-12 NOTE — Unmapped (Signed)
Lab on 06/12/2020   Component Date Value Ref Range Status   ??? Sodium 06/12/2020 134* 135 - 145 mmol/L Final   ??? Potassium 06/12/2020 3.9  3.4 - 4.8 mmol/L Final   ??? Chloride 06/12/2020 104  98 - 107 mmol/L Final   ??? Anion Gap 06/12/2020 9  5 - 14 mmol/L Final   ??? CO2 06/12/2020 21.0  20.0 - 31.0 mmol/L Final   ??? BUN 06/12/2020 31* 9 - 23 mg/dL Final   ??? Creatinine 06/12/2020 1.73* 0.60 - 0.80 mg/dL Final   ??? BUN/Creatinine Ratio 06/12/2020 18   Final   ??? EGFR CKD-EPI Non-African American,* 06/12/2020 29* >=60 mL/min/1.52m2 Final   ??? EGFR CKD-EPI African American, Fem* 06/12/2020 33* >=60 mL/min/1.90m2 Final   ??? Glucose 06/12/2020 119  70 - 179 mg/dL Final   ??? Calcium 16/01/9603 8.8  8.7 - 10.4 mg/dL Final   ??? Albumin 54/12/8117 3.1* 3.4 - 5.0 g/dL Final   ??? Total Protein 06/12/2020 6.1  5.7 - 8.2 g/dL Final   ??? Total Bilirubin 06/12/2020 0.4  0.3 - 1.2 mg/dL Final   ??? AST 14/78/2956 72* <=34 U/L Final   ??? ALT 06/12/2020 65* 10 - 49 U/L Final   ??? Alkaline Phosphatase 06/12/2020 350* 46 - 116 U/L Final   ??? TSH 06/12/2020 0.261* 0.550 - 4.780 uIU/mL Final   ??? Free T4 06/12/2020 1.28  0.89 - 1.76 ng/dL Final   ??? WBC 21/30/8657 5.7  4.5 - 11.0 10*9/L Final   ??? RBC 06/12/2020 2.23* 4.00 - 5.20 10*12/L Final   ??? HGB 06/12/2020 8.6* 12.0 - 16.0 g/dL Final   ??? HCT 84/69/6295 25.2* 36.0 - 46.0 % Final   ??? MCV 06/12/2020 113.3* 80.0 - 100.0 fL Final   ??? MCH 06/12/2020 38.6* 26.0 - 34.0 pg Final   ??? MCHC 06/12/2020 34.1  31.0 - 37.0 g/dL Final   ??? RDW 28/41/3244 15.7* 12.0 - 15.0 % Final   ??? MPV 06/12/2020 9.2  7.0 - 10.0 fL Final   ??? Platelet 06/12/2020 191  150 - 440 10*9/L Final   ??? Neutrophils % 06/12/2020 77.0  % Final   ??? Lymphocytes % 06/12/2020 10.1  % Final   ??? Monocytes % 06/12/2020 10.0  % Final   ??? Eosinophils % 06/12/2020 0.9  % Final   ??? Basophils % 06/12/2020 0.2  % Final   ??? Absolute Neutrophils 06/12/2020 4.4  2.0 - 7.5 10*9/L Final   ??? Absolute Lymphocytes 06/12/2020 0.6* 1.5 - 5.0 10*9/L Final   ??? Absolute Monocytes 06/12/2020 0.6  0.2 - 0.8 10*9/L Final   ??? Absolute Eosinophils 06/12/2020 0.1  0.0 - 0.4 10*9/L Final   ??? Absolute Basophils 06/12/2020 0.0  0.0 - 0.1 10*9/L Final   ??? Large Unstained Cells 06/12/2020 2  0 - 4 % Final   ??? Macrocytosis 06/12/2020 Marked* Not Present Final   ??? Hypochromasia 06/12/2020 Slight* Not Present Final   ??? Spec Gravity/POC 06/12/2020 1.015  1.003 - 1.030 Final   ??? PH/POC 06/12/2020 5.5  5.0 - 9.0 Final   ??? Leuk Esterase/POC 06/12/2020 3+* Negative Final   ??? Nitrite/POC 06/12/2020 Negative  Negative Final   ??? Protein/POC 06/12/2020 1+* Negative Final   ??? UA Glucose/POC 06/12/2020 Negative  Negative Final   ??? Ketones, POC 06/12/2020 Negative  Negative Final   ??? Bilirubin/POC 06/12/2020 Negative  Negative Final   ??? Blood/POC 06/12/2020 Trace-intact* Negative Final   ??? Urobilinogen/POC 06/12/2020 1.0  0.2 -  1.0 mg/dL Final

## 2020-06-13 NOTE — Unmapped (Signed)
Specialty Medication Follow-up    Lisa Burton is a 75 y.o. female with recurrent ovarian cancer who I am seeing for follow up on their treatment with pembrolizumab, bevacizumab, and oral cyclophosphamide.     Chemotherapy: Cyclophosphamide 50 mg PO daily   Start date: 06/12/20    A/P:   1. Oral Chemotherapy: CBC w/diff and CMP reviewed. Grade 2 anemia which has worsened. Grade 2 serum creatinine elevation which has improved and is likely secondary to olaparib administration therefore expect this to improve. Grade 1 AST elevation, grade 1 ALT elevation , and grade 2 alk phos elevation all of which have worsened likely secondary to disease and will consider as baseline for the start of treatment. Discussed results with Dr. Duard Brady who is in agreement with starting treatment. Will repeat labs in 1 week to reassess toxicity.   ?? Start cyclophosphamide 50 mg PO daily  ?? Obtain CBC w/diff, CMP, and Mag in 1 week    I spent approximately 15 minutes in direct patient care.    Next follow up: In 1 week with lab results (06/22/20)    Referring physician: Dr. Trey Paula, PharmD, BCOP, CPP  Gynecologic Oncology Clinic Pharmacist  Pager: [phone]    S/O: Lisa Burton presents to infusion for immunotherapy. Reviewed adverse effects and when to call with the patient.     Medications reviewed and updated in EPIC? yes    Missed doses: N/A (not yet started)    Labs  Lab on 06/12/2020   Component Date Value Ref Range Status   ??? Sodium 06/12/2020 134* 135 - 145 mmol/L Final   ??? Potassium 06/12/2020 3.9  3.4 - 4.8 mmol/L Final   ??? Chloride 06/12/2020 104  98 - 107 mmol/L Final   ??? Anion Gap 06/12/2020 9  5 - 14 mmol/L Final   ??? CO2 06/12/2020 21.0  20.0 - 31.0 mmol/L Final   ??? BUN 06/12/2020 31* 9 - 23 mg/dL Final   ??? Creatinine 06/12/2020 1.73* 0.60 - 0.80 mg/dL Final   ??? BUN/Creatinine Ratio 06/12/2020 18   Final   ??? EGFR CKD-EPI Non-African American,* 06/12/2020 29* >=60 mL/min/1.43m2 Final   ??? EGFR CKD-EPI African American, Fem* 06/12/2020 33* >=60 mL/min/1.75m2 Final   ??? Glucose 06/12/2020 119  70 - 179 mg/dL Final   ??? Calcium 81/19/1478 8.8  8.7 - 10.4 mg/dL Final   ??? Albumin 29/56/2130 3.1* 3.4 - 5.0 g/dL Final   ??? Total Protein 06/12/2020 6.1  5.7 - 8.2 g/dL Final   ??? Total Bilirubin 06/12/2020 0.4  0.3 - 1.2 mg/dL Final   ??? AST 86/57/8469 72* <=34 U/L Final   ??? ALT 06/12/2020 65* 10 - 49 U/L Final   ??? Alkaline Phosphatase 06/12/2020 350* 46 - 116 U/L Final   ??? TSH 06/12/2020 0.261* 0.550 - 4.780 uIU/mL Final   ??? Free T4 06/12/2020 1.28  0.89 - 1.76 ng/dL Final   ??? WBC 62/95/2841 5.7  4.5 - 11.0 10*9/L Final   ??? RBC 06/12/2020 2.23* 4.00 - 5.20 10*12/L Final   ??? HGB 06/12/2020 8.6* 12.0 - 16.0 g/dL Final   ??? HCT 32/44/0102 25.2* 36.0 - 46.0 % Final   ??? MCV 06/12/2020 113.3* 80.0 - 100.0 fL Final   ??? MCH 06/12/2020 38.6* 26.0 - 34.0 pg Final   ??? MCHC 06/12/2020 34.1  31.0 - 37.0 g/dL Final   ??? RDW 72/53/6644 15.7* 12.0 - 15.0 % Final   ??? MPV 06/12/2020 9.2  7.0 - 10.0 fL Final   ??? Platelet 06/12/2020 191  150 - 440 10*9/L Final   ??? Neutrophils % 06/12/2020 77.0  % Final   ??? Lymphocytes % 06/12/2020 10.1  % Final   ??? Monocytes % 06/12/2020 10.0  % Final   ??? Eosinophils % 06/12/2020 0.9  % Final   ??? Basophils % 06/12/2020 0.2  % Final   ??? Absolute Neutrophils 06/12/2020 4.4  2.0 - 7.5 10*9/L Final   ??? Absolute Lymphocytes 06/12/2020 0.6* 1.5 - 5.0 10*9/L Final   ??? Absolute Monocytes 06/12/2020 0.6  0.2 - 0.8 10*9/L Final   ??? Absolute Eosinophils 06/12/2020 0.1  0.0 - 0.4 10*9/L Final   ??? Absolute Basophils 06/12/2020 0.0  0.0 - 0.1 10*9/L Final   ??? Large Unstained Cells 06/12/2020 2  0 - 4 % Final   ??? Macrocytosis 06/12/2020 Marked* Not Present Final   ??? Hypochromasia 06/12/2020 Slight* Not Present Final   ??? Spec Gravity/POC 06/12/2020 1.015  1.003 - 1.030 Final   ??? PH/POC 06/12/2020 5.5  5.0 - 9.0 Final   ??? Leuk Esterase/POC 06/12/2020 3+* Negative Final   ??? Nitrite/POC 06/12/2020 Negative  Negative Final   ??? Protein/POC 06/12/2020 1+* Negative Final   ??? UA Glucose/POC 06/12/2020 Negative  Negative Final   ??? Ketones, POC 06/12/2020 Negative  Negative Final   ??? Bilirubin/POC 06/12/2020 Negative  Negative Final   ??? Blood/POC 06/12/2020 Trace-intact* Negative Final   ??? Urobilinogen/POC 06/12/2020 1.0  0.2 - 1.0 mg/dL Final     Gynecologic Oncology    Gynecologic Oncology Metrics:      Dose and Schedule: Cyclophosphamide 50 mg PO daily + pembrolizumab 200 mg IV q3wk + bevacizumab 15 mg/kg IV q3wk     Chemotherapy Dose: Dose Documented     Chemotherapy Schedule: Schedule documented     NCI CTCAE: Anemia - Grade 2, Serum creatinine - Grade 2, AST - Grade 1, ALT - Grade 1, Alk Phos - Grade 2        Interventions: Education provided         ______________________________________________________________________    PMH: reviewed    Current medications: reviewed    Allergies: reviewed      Education points:  1. Adherence to oral chemotherapy was discussed. If the patient is having difficulty remembering to take her dose there are a number of strategies that we can use in order to help.     2. Food/drug Considerations: Oral cyclophosphamide can be taken with or without food. Should be taken in the morning with plenty of water.    3. The following adverse effects were discussed, including related supportive care recommendations and emergency situations:        A. Hemorrhagic cystitis   B. Hypertension   C. Rash/itching   D. Diarrhea   E. Nephritis   F. Hepatitis   G. Fatigue    4. Drug Drug Interactions:  None

## 2020-06-19 ENCOUNTER — Ambulatory Visit: Admit: 2020-06-19 | Discharge: 2020-06-20 | Payer: MEDICARE

## 2020-06-19 LAB — CBC W/ AUTO DIFF
BASOPHILS ABSOLUTE COUNT: 0.1 10*9/L (ref 0.0–0.1)
BASOPHILS RELATIVE PERCENT: 0.8 %
EOSINOPHILS ABSOLUTE COUNT: 0.1 10*9/L (ref 0.0–0.5)
EOSINOPHILS RELATIVE PERCENT: 1 %
HEMATOCRIT: 24.9 % — ABNORMAL LOW (ref 34.0–44.0)
HEMOGLOBIN: 8.7 g/dL — ABNORMAL LOW (ref 11.3–14.9)
LYMPHOCYTES ABSOLUTE COUNT: 0.7 10*9/L — ABNORMAL LOW (ref 1.1–3.6)
LYMPHOCYTES RELATIVE PERCENT: 12.3 %
MEAN CORPUSCULAR HEMOGLOBIN CONC: 35.2 g/dL (ref 32.0–36.0)
MEAN CORPUSCULAR HEMOGLOBIN: 37.8 pg — ABNORMAL HIGH (ref 25.9–32.4)
MEAN CORPUSCULAR VOLUME: 107.6 fL — ABNORMAL HIGH (ref 77.6–95.7)
MEAN PLATELET VOLUME: 8 fL (ref 6.8–10.7)
MONOCYTES ABSOLUTE COUNT: 0.9 10*9/L — ABNORMAL HIGH (ref 0.3–0.8)
MONOCYTES RELATIVE PERCENT: 15.3 %
NEUTROPHILS ABSOLUTE COUNT: 4.3 10*9/L (ref 1.8–7.8)
NEUTROPHILS RELATIVE PERCENT: 70.6 %
NUCLEATED RED BLOOD CELLS: 0 /100{WBCs} (ref <=4)
PLATELET COUNT: 167 10*9/L (ref 150–450)
RED BLOOD CELL COUNT: 2.31 10*12/L — ABNORMAL LOW (ref 3.95–5.13)
RED CELL DISTRIBUTION WIDTH: 15.8 % — ABNORMAL HIGH (ref 12.2–15.2)
WBC ADJUSTED: 6.1 10*9/L (ref 3.6–11.2)

## 2020-06-19 LAB — COMPREHENSIVE METABOLIC PANEL
ALBUMIN: 3.2 g/dL — ABNORMAL LOW (ref 3.4–5.0)
ALKALINE PHOSPHATASE: 368 U/L — ABNORMAL HIGH (ref 46–116)
ALT (SGPT): 44 U/L (ref 10–49)
ANION GAP: 8 mmol/L (ref 5–14)
AST (SGOT): 62 U/L — ABNORMAL HIGH (ref <=34)
BILIRUBIN TOTAL: 0.5 mg/dL (ref 0.3–1.2)
BLOOD UREA NITROGEN: 31 mg/dL — ABNORMAL HIGH (ref 9–23)
BUN / CREAT RATIO: 19
CALCIUM: 9 mg/dL (ref 8.7–10.4)
CHLORIDE: 108 mmol/L — ABNORMAL HIGH (ref 98–107)
CO2: 20.6 mmol/L (ref 20.0–31.0)
CREATININE: 1.59 mg/dL — ABNORMAL HIGH
EGFR CKD-EPI AA FEMALE: 37 mL/min/{1.73_m2} — ABNORMAL LOW (ref >=60)
EGFR CKD-EPI NON-AA FEMALE: 32 mL/min/{1.73_m2} — ABNORMAL LOW (ref >=60)
GLUCOSE RANDOM: 115 mg/dL (ref 70–179)
POTASSIUM: 3.9 mmol/L (ref 3.4–4.8)
PROTEIN TOTAL: 6.9 g/dL (ref 5.7–8.2)
SODIUM: 137 mmol/L (ref 135–145)

## 2020-06-19 LAB — MAGNESIUM: MAGNESIUM: 1.5 mg/dL — ABNORMAL LOW (ref 1.6–2.6)

## 2020-06-20 NOTE — Unmapped (Signed)
Specialty Medication Follow-up    Lisa Burton is a 75 y.o. female with recurrent ovarian cancer who I am seeing for follow up on their treatment with pembrolizumab, bevacizumab, and oral cyclophosphamide.     Chemotherapy: Cyclophosphamide 50 mg PO daily   Start date: 06/12/20    A/P:   1. Oral Chemotherapy: CBC w/diff and CMP reviewed. Grade 2 anemia and grade 1 fatigue both of which are stable. Grade 2 serum creatinine elevation and grade 1 AST elevation both of which have improved.  Grade 2 alk phos elevation which has worsened. No grade 3 toxicities therefore will continue at current dose intensity. Will repeat labs in 1 week given low hemoglobin.   ?? Continue cyclophosphamide 50 mg PO daily  ?? Obtain CBC w/diff, CMP, and Mag in 1 week    I spent approximately 5 minutes in direct patient care.    Next follow up: In 1 week with lab results (06/26/20)    Referring physician: Dr. Trey Paula, PharmD, BCOP, CPP  Gynecologic Oncology Clinic Pharmacist  Pager: [phone]    S/O: Lisa Burton was contacted via telephone regarding recent lab results and oral chemotherapy toxicity assessment. She endorses that she continues to feel fatigued and aching. She denies nausea or vomiting. She endorses that diarrhea has improved and she no longer is having loose BMs.      Medications reviewed and updated in EPIC? No    Missed doses: ----    Labs  Appointment on 06/19/2020   Component Date Value Ref Range Status   ??? Sodium 06/19/2020 137  135 - 145 mmol/L Final   ??? Potassium 06/19/2020 3.9  3.4 - 4.8 mmol/L Final   ??? Chloride 06/19/2020 108 (A) 98 - 107 mmol/L Final   ??? Anion Gap 06/19/2020 8  5 - 14 mmol/L Final   ??? CO2 06/19/2020 20.6  20.0 - 31.0 mmol/L Final   ??? BUN 06/19/2020 31 (A) 9 - 23 mg/dL Final   ??? Creatinine 06/19/2020 1.59 (A) 0.60 - 0.80 mg/dL Final   ??? BUN/Creatinine Ratio 06/19/2020 19   Final   ??? EGFR CKD-EPI Non-African American,* 06/19/2020 32 (A) >=60 mL/min/1.27m2 Final   ??? EGFR CKD-EPI African American, Fem* 06/19/2020 37 (A) >=60 mL/min/1.8m2 Final   ??? Glucose 06/19/2020 115  70 - 179 mg/dL Final   ??? Calcium 45/40/9811 9.0  8.7 - 10.4 mg/dL Final   ??? Albumin 91/47/8295 3.2 (A) 3.4 - 5.0 g/dL Final   ??? Total Protein 06/19/2020 6.9  5.7 - 8.2 g/dL Final   ??? Total Bilirubin 06/19/2020 0.5  0.3 - 1.2 mg/dL Final   ??? AST 62/13/0865 62 (A) <=34 U/L Final   ??? ALT 06/19/2020 44  10 - 49 U/L Final   ??? Alkaline Phosphatase 06/19/2020 368 (A) 46 - 116 U/L Final   ??? Magnesium 06/19/2020 1.5 (A) 1.6 - 2.6 mg/dL Final   ??? WBC 78/46/9629 6.1  3.6 - 11.2 10*9/L Final   ??? RBC 06/19/2020 2.31 (A) 3.95 - 5.13 10*12/L Final   ??? HGB 06/19/2020 8.7 (A) 11.3 - 14.9 g/dL Final   ??? HCT 52/84/1324 24.9 (A) 34.0 - 44.0 % Final   ??? MCV 06/19/2020 107.6 (A) 77.6 - 95.7 fL Final   ??? MCH 06/19/2020 37.8 (A) 25.9 - 32.4 pg Final   ??? MCHC 06/19/2020 35.2  32.0 - 36.0 g/dL Final   ??? RDW 40/01/2724 15.8 (A) 12.2 - 15.2 % Final   ??? MPV  06/19/2020 8.0  6.8 - 10.7 fL Final   ??? Platelet 06/19/2020 167  150 - 450 10*9/L Final   ??? nRBC 06/19/2020 0  <=4 /100 WBCs Final   ??? Neutrophils % 06/19/2020 70.6  % Final   ??? Lymphocytes % 06/19/2020 12.3  % Final   ??? Monocytes % 06/19/2020 15.3  % Final   ??? Eosinophils % 06/19/2020 1.0  % Final   ??? Basophils % 06/19/2020 0.8  % Final   ??? Absolute Neutrophils 06/19/2020 4.3  1.8 - 7.8 10*9/L Final   ??? Absolute Lymphocytes 06/19/2020 0.7 (A) 1.1 - 3.6 10*9/L Final   ??? Absolute Monocytes 06/19/2020 0.9 (A) 0.3 - 0.8 10*9/L Final   ??? Absolute Eosinophils 06/19/2020 0.1  0.0 - 0.5 10*9/L Final   ??? Absolute Basophils 06/19/2020 0.1  0.0 - 0.1 10*9/L Final     Gynecologic Oncology    Gynecologic Oncology Metrics:      Dose and Schedule: Cyclophosphamide 50 mg PO daily + pembrolizumab 200 mg IV q3wk + bevacizumab 15 mg/kg IV q3wk     Chemotherapy Dose: Dose Documented     Chemotherapy Schedule: Schedule documented     NCI CTCAE: Anemia - Grade 2, Fatigue - Grade 1, Serum creatinine - Grade 2, AST - Grade 1, Alk Phos - Grade 2

## 2020-06-21 NOTE — Unmapped (Signed)
TC to pt in response to a mychart message about intermittent, RUQ abdominal pain. Pt reports that the pain started last night, and she rates it 6/10 when it is at it's worst. Pt reports previously having diarrhea that resolved 4 days ago, but she has not had a bowel movement since. Pt is s/p cycle #1 of Bevacizumab/Pembro on 2/28. Pt was advised to start taking Colace and MiraLax today, because the abdominal pain may be related to her constipation. Pt reports that she drinks a lot of water, and was encouraged to continue doing so. Pt reports that she is not eating a lot due to decreased appetite. Pt advised that adequate caloric intake is important and advised to try eating several, small meals. Pt also encouraged to try protein shakes when she finds it difficult to eat. Pt verbalized understanding, all questions were answered and pt advised that I will follow up with her tomorrow.

## 2020-06-26 ENCOUNTER — Ambulatory Visit: Admit: 2020-06-26 | Discharge: 2020-06-26 | Payer: MEDICARE

## 2020-06-26 LAB — COMPREHENSIVE METABOLIC PANEL
ALBUMIN: 3.2 g/dL — ABNORMAL LOW (ref 3.4–5.0)
ALKALINE PHOSPHATASE: 399 U/L — ABNORMAL HIGH (ref 46–116)
ALT (SGPT): 38 U/L (ref 10–49)
ANION GAP: 11 mmol/L (ref 5–14)
AST (SGOT): 59 U/L — ABNORMAL HIGH (ref <=34)
BILIRUBIN TOTAL: 0.5 mg/dL (ref 0.3–1.2)
BLOOD UREA NITROGEN: 41 mg/dL — ABNORMAL HIGH (ref 9–23)
BUN / CREAT RATIO: 23
CALCIUM: 9.2 mg/dL (ref 8.7–10.4)
CHLORIDE: 105 mmol/L (ref 98–107)
CO2: 19.3 mmol/L — ABNORMAL LOW (ref 20.0–31.0)
CREATININE: 1.76 mg/dL — ABNORMAL HIGH
EGFR CKD-EPI AA FEMALE: 32 mL/min/{1.73_m2} — ABNORMAL LOW (ref >=60)
EGFR CKD-EPI NON-AA FEMALE: 28 mL/min/{1.73_m2} — ABNORMAL LOW (ref >=60)
GLUCOSE RANDOM: 142 mg/dL (ref 70–179)
POTASSIUM: 4 mmol/L (ref 3.4–4.8)
PROTEIN TOTAL: 7.1 g/dL (ref 5.7–8.2)
SODIUM: 135 mmol/L (ref 135–145)

## 2020-06-26 LAB — CBC W/ AUTO DIFF
BASOPHILS ABSOLUTE COUNT: 0 10*9/L (ref 0.0–0.1)
BASOPHILS RELATIVE PERCENT: 0.6 %
EOSINOPHILS ABSOLUTE COUNT: 0 10*9/L (ref 0.0–0.5)
EOSINOPHILS RELATIVE PERCENT: 0.8 %
HEMATOCRIT: 28.1 % — ABNORMAL LOW (ref 34.0–44.0)
HEMOGLOBIN: 9.6 g/dL — ABNORMAL LOW (ref 11.3–14.9)
LYMPHOCYTES ABSOLUTE COUNT: 0.6 10*9/L — ABNORMAL LOW (ref 1.1–3.6)
LYMPHOCYTES RELATIVE PERCENT: 11.4 %
MEAN CORPUSCULAR HEMOGLOBIN CONC: 34.3 g/dL (ref 32.0–36.0)
MEAN CORPUSCULAR HEMOGLOBIN: 37.1 pg — ABNORMAL HIGH (ref 25.9–32.4)
MEAN CORPUSCULAR VOLUME: 108.2 fL — ABNORMAL HIGH (ref 77.6–95.7)
MEAN PLATELET VOLUME: 7.7 fL (ref 6.8–10.7)
MONOCYTES ABSOLUTE COUNT: 0.8 10*9/L (ref 0.3–0.8)
MONOCYTES RELATIVE PERCENT: 13.5 %
NEUTROPHILS ABSOLUTE COUNT: 4.2 10*9/L (ref 1.8–7.8)
NEUTROPHILS RELATIVE PERCENT: 73.7 %
NUCLEATED RED BLOOD CELLS: 0 /100{WBCs} (ref <=4)
PLATELET COUNT: 200 10*9/L (ref 150–450)
RED BLOOD CELL COUNT: 2.6 10*12/L — ABNORMAL LOW (ref 3.95–5.13)
RED CELL DISTRIBUTION WIDTH: 16.1 % — ABNORMAL HIGH (ref 12.2–15.2)
WBC ADJUSTED: 5.7 10*9/L (ref 3.6–11.2)

## 2020-06-26 LAB — MAGNESIUM: MAGNESIUM: 1.6 mg/dL (ref 1.6–2.6)

## 2020-06-26 NOTE — Unmapped (Signed)
[facility]: Return Visit       ID:  Lisa Burton is a 75 y.o. year old woman who is seen today in follow up of osteoporosis.    Reason for Visit: Osteoporosis    Last Visit: 03/27/2018  - We agreed to complete 10 years of therapy with alendronate   - She was having frequent fall so we were not sure a holiday would be a viable plan and thought transition to denosumab might be indicated after 10 years of therapy but planned to reassess over time    Interval Events:   - I have not seen her in over 2 years  - She was found to have ovarian cancer recurrence 08/2018 and was treated with additional cycles of carboplatin  - She then began niraparib 09/2019 and did well for a time but then progressed through this   - Currently, she is being treated witih a combination of pembrolizumab, cyclophosphamide, and bevacizumab  - She reports that she has been feeling extremely tired and stopped taking most of her medications, including alendronate, about 6 weeks ago    Fracture history: The patient has  suffered the following fragility fractures:   - Wrist fracture 1998  - Left superior pubic ramus fracture 2021 (noted on CT)    Menopause: Age 60- started on HRT for about 5 years.    Summary of Prior Treatment:   10/2011-05/2020: Alendronate 70 mg weekly    Vitamin D: None currently  Calcium: None currently     Past Medical, Surgical, Social and Family histories reviewed and updated in the chart 06/27/2020.    Current Outpatient Medications   Medication Sig Dispense Refill   ??? amoxicillin (AMOXIL) 500 MG tablet Take 4 tablets (2,000 mg total) by mouth once for 1 dose. Prior to dental cleaning 4 tablet 0   ??? ARIPiprazole (ABILIFY) 10 MG tablet Take 10 mg by mouth daily.      ??? ascorbic acid, vitamin C, (VITAMIN C) 1000 MG tablet Take 1,000 mg by mouth daily.      ??? atorvastatin (LIPITOR) 10 MG tablet Take 1 tablet (10 mg total) by mouth nightly. 90 tablet 3   ??? CALCIUM ORAL Take by mouth daily.      ??? cholecalciferol, vitamin D3, (VITAMIN D3) 1,000 unit capsule Take 1,000 Units by mouth daily.      ??? cyclophosphamide (CYTOXAN) 50 mg capsule Take 1 capsule (50 mg total) by mouth daily . 30 capsule 5   ??? latanoprost (XALATAN) 0.005 % ophthalmic solution      ??? levomefol-B6-meB12-algal oil (METANX, ALGAL OIL,) 3 mg-35 mg-2 mg -90.314 mg capsule Take 2 capsules by mouth daily. 60 capsule 6   ??? lisinopriL (PRINIVIL,ZESTRIL) 20 MG tablet Take 1 tablet (20 mg total) by mouth daily. 90 tablet 1   ??? ondansetron (ZOFRAN) 8 MG tablet Take 1 tablet (8 mg total) by mouth every eight (8) hours as needed for nausea. 30 tablet 2   ??? oxybutynin (DITROPAN) 5 MG tablet Take 1 tablet by mouth 4 times daily 360 tablet 0   ??? sodium fluoride-pot nitrate 1.1-5 % Pste BRUSH WITH A PEA SIZED AMOUNT TWICE A DAY FOR 2 MINUTES     ??? traZODone (DESYREL) 100 MG tablet Take 100 mg by mouth nightly.      ??? venlafaxine (EFFEXOR-XR) 150 MG 24 hr capsule Take 2 capsules (300 mg total) by mouth every morning. 180 capsule 3     No current  facility-administered medications for this visit.          Allergies   Allergen Reactions   ??? Codeine Rash   ??? Sulfa (Sulfonamide Antibiotics) Rash and Nausea Only        Review of Systems   Pertinent positive and negative systems are described in the HPI; the remainder of the 14 systems are negative.    Vital Signs  BP 108/70  - Pulse 93  - Ht 154.9 cm (5' 1)  - Wt 56.7 kg (125 lb)  - BMI 23.62 kg/m??    Physical Exam  Constitutional:       Appearance: She is well-developed. She is ill-appearing.      Comments: Appears much more chronically ill than at prior visit 2 years ago.    Cardiovascular:      Rate and Rhythm: Normal rate and regular rhythm.   Pulmonary:      Effort: Pulmonary effort is normal.      Breath sounds: Normal breath sounds.   Skin:     General: Skin is warm and dry.   Neurological:      General: No focal deficit present.      Mental Status: She is alert and oriented to person, place, and time. Comments: Romberg not performed today as she is seated in a wheelchair.            Laboratory Review: I personally reviewed labs 06/27/20.    06/26/2020  Na 135  K 4.0  Cr 1.76  Ca  9.2  Alk Phos 399    Radiology Review: I personally reviewed and interpreted imaging 06/27/20.  DXA Review:      Date Lumbar Spine Femoral Neck Total Hip   01/21/2018 0.859 (-1.7) 0.576 (-2.5) 0.620 (-2.6)   10/23/2016 0.894 (-1.4) 0.548 (-2.7) 0.593 (-2.9)**   10/19/2014 0.884 (-1.5) 0.550 (-2.7) 0.639 (-2.5)   10/19/2012 0.840 (-1.9) 0.574 (-2.5) 0.634 (-2.5)   09/19/2011 0.841 (-1.9)* 0.572 (-2.5) 0.597 (-2.8)     *Significant sclerosis/osteophytosis- likely invalidated   **Different machine from prior scan- area of interest is smaller which may explain the difference    LS: 2.1% increase when compared with baseline study in 2013 and 4% decrease when compared with 2018 year  TH: 4% increase when compared with baseline study in 2013 and 4.5% increase when compared with 2018 year.  FN: 0% decline when compared to baseline study in 2013 and 5% increase from prior study in 2018.     CT Abdomen/Pelvis 05/15/2020  - Innumerable hepatic lesions, new from prior, concerning for metastatic disease   - Enlarging peritoneal implants   - Chronic fracture deformity of left superior pubic ramus, new since 07/09/2019    Impression and Plan:     Lisa Burton is a 75 y.o.with a history of osteoporosis and a recent diagnosis of ovarian cancer who returns to clinic today for follow up of osteoporosis.    1. Post-Menopausal Osteoporosis: Lisa Burton is seen in follow up today. I have unfortunately not seen her in about 2 years. She had been followed in our clinic for several years for management of osteoporosis and was treated primarily with oral bisphosphonate. In 2019, we agreed to continue alendronate for 10 years and then reassess. Unfortunately, she has had a very eventful medical course since that time.  Most notably she had recurrence of her ovarian cancer for which she is required multiple different therapies over the last couple of years.  In that setting, she has  continued to take her alendronate, but message me in February updating me on her clinical status and asking whether she should continue to take her alendronate or not.  Given the complexity of her situation, I recommended clinic follow-up so that we could fully assess her current status and come to an agreement on a plan.  In reviewing her additional record, I note that there are several additional factors worth considering.  First, her renal function has declined significantly since the last time I have seen her and her GFR is now quite borderline for use of bisphosphonate.  Additionally, she was found to have a pubic fracture incidentally on imaging of her abdomen done for different purpose in January.  She reports that she is continuing to fall fairly regularly.  Lastly, it should be noted that she stopped her alendronate about 6 weeks ago.  Given all of these factors, I agree that we should discontinue alendronate.  I think that there is very little if any additional benefit to be derived at this time and potential risk given her decline in renal function and prolonged course of therapy.  The question then is whether we should consider transition to an alternative therapy or not.  Overall Ms. Dewitt Burton is much sicker than she was when I saw her last in 2019 and I am not sure of all the specifics regarding her prognosis.  With that said, I think that she remains at very high risk for additional fragility fractures given the combination of her bone density, poor nutritional status, and prior fractures.  As such, I think it is reasonable to consider alternative options for treatment of osteoporosis given that a significant fracture such as a hip fracture would likely be catastrophic for her.  The most reasonable option at this time would likely be denosumab.  We had discussed this therapy in the past and reviewed the mechanism of action and potential adverse effects in detail again today.  This medication has been well studied in those with prior bisphosphonate exposure.  If she is to pursue this medication, I reviewed with her that she needs to be on consistent vitamin D supplementation and calcium supplementation to total 1000 mg daily.  This is particularly important given the risk of hypocalcemia after injection. Given her ongoing cancer therapy, I will discuss this with her oncology team in case they prefer a different course of action. If this is acceptable, we will place orders for this. It would also be good to get an updated bone density study, however, this is not essential at this time and I do not want to burden her given her ongoing therapies and overall poor status.   - Stop alendronate  - Consider initiation of denosumab pending discussion with oncology team  - Restart D3- recommend 2000 units daily  - Restart calcium supplementation with goal of daily intake 1000-1200 mg  - Update DXA at some point if able   - Stop alendronate  - Start denosumab 60 mg subcutaneous q6 mo  - Check vitamin D       Return in about 6 months (around 12/27/2020).      I personally spent 40 minutes face-to-face and non-face-to-face in the care of this patient, which includes all pre, intra, and post visit time on the date of service.      Leandra Kern, MD

## 2020-06-26 NOTE — Unmapped (Signed)
1. Restart D3 (Vit D)  2. Restart calcium supplement- goal 1000 mg daily   3. I'll discuss Prolia with oncology team

## 2020-06-26 NOTE — Unmapped (Signed)
Specialty Medication Follow-up    Lisa Burton is a 75 y.o. female with recurrent ovarian cancer who I am seeing for follow up on their treatment with pembrolizumab, bevacizumab, and oral cyclophosphamide.     Chemotherapy: Cyclophosphamide 50 mg PO daily   Start date: 06/12/20    A/P:   1. Oral Chemotherapy: CBC w/diff and CMP reviewed. Grade 2 anemia and grade 1 AST elevation both of which have improved. Grade 2 serum creatinine elevation and grade 2 alk phos elevation both of which have worsened. Grade 2 fatigue which is stable. No grade 3 toxicities therefore will continue at current dose intensity. Will repeat labs next week prior to infusion.   ?? Continue cyclophosphamide 50 mg PO daily  ?? Obtain CBC w/diff, CMP, and Mag in 1 week    I spent approximately 10 minutes in direct patient care.    Next follow up: In 1 week at infusion (07/03/20)    Referring physician: Dr. Trey Paula, PharmD, BCOP, CPP  Gynecologic Oncology Clinic Pharmacist  Pager: [phone]    S/O: Lisa Burton was contacted via telephone regarding recent lab results and oral chemotherapy toxicity assessment. She endorses that she is experiencing both fatigue and generalized pain. She endorses that she is exhausted all of the time and spends most of the day in bed. She reports that even getting to the bathroom is exhausting for her. She reports that this has not worsened with the start of treatment but also has not improved with improving hemoglobin. She endorses that she took a dose of docusate and miralax which resulted in 4 days of diarrhea but this has now resolved. She denies rash or itching.     Medications reviewed and updated in EPIC? No    Missed doses: ----    Labs  Appointment on 06/26/2020   Component Date Value Ref Range Status   ??? Sodium 06/26/2020 135  135 - 145 mmol/L Final   ??? Potassium 06/26/2020 4.0  3.4 - 4.8 mmol/L Final   ??? Chloride 06/26/2020 105  98 - 107 mmol/L Final   ??? Anion Gap 06/26/2020 11  5 - 14 mmol/L Final   ??? CO2 06/26/2020 19.3 (A) 20.0 - 31.0 mmol/L Final   ??? BUN 06/26/2020 41 (A) 9 - 23 mg/dL Final   ??? Creatinine 06/26/2020 1.76 (A) 0.60 - 0.80 mg/dL Final   ??? BUN/Creatinine Ratio 06/26/2020 23   Final   ??? EGFR CKD-EPI Non-African American,* 06/26/2020 28 (A) >=60 mL/min/1.24m2 Final   ??? EGFR CKD-EPI African American, Fem* 06/26/2020 32 (A) >=60 mL/min/1.70m2 Final   ??? Glucose 06/26/2020 142  70 - 179 mg/dL Final   ??? Calcium 45/40/9811 9.2  8.7 - 10.4 mg/dL Final   ??? Albumin 91/47/8295 3.2 (A) 3.4 - 5.0 g/dL Final   ??? Total Protein 06/26/2020 7.1  5.7 - 8.2 g/dL Final   ??? Total Bilirubin 06/26/2020 0.5  0.3 - 1.2 mg/dL Final   ??? AST 62/13/0865 59 (A) <=34 U/L Final   ??? ALT 06/26/2020 38  10 - 49 U/L Final   ??? Alkaline Phosphatase 06/26/2020 399 (A) 46 - 116 U/L Final   ??? Magnesium 06/26/2020 1.6  1.6 - 2.6 mg/dL Final   ??? WBC 78/46/9629 5.7  3.6 - 11.2 10*9/L Final   ??? RBC 06/26/2020 2.60 (A) 3.95 - 5.13 10*12/L Final   ??? HGB 06/26/2020 9.6 (A) 11.3 - 14.9 g/dL Final   ??? HCT 52/84/1324 28.1 (A) 34.0 -  44.0 % Final   ??? MCV 06/26/2020 108.2 (A) 77.6 - 95.7 fL Final   ??? MCH 06/26/2020 37.1 (A) 25.9 - 32.4 pg Final   ??? MCHC 06/26/2020 34.3  32.0 - 36.0 g/dL Final   ??? RDW 16/01/9603 16.1 (A) 12.2 - 15.2 % Final   ??? MPV 06/26/2020 7.7  6.8 - 10.7 fL Final   ??? Platelet 06/26/2020 200  150 - 450 10*9/L Final   ??? nRBC 06/26/2020 0  <=4 /100 WBCs Final   ??? Neutrophils % 06/26/2020 73.7  % Final   ??? Lymphocytes % 06/26/2020 11.4  % Final   ??? Monocytes % 06/26/2020 13.5  % Final   ??? Eosinophils % 06/26/2020 0.8  % Final   ??? Basophils % 06/26/2020 0.6  % Final   ??? Absolute Neutrophils 06/26/2020 4.2  1.8 - 7.8 10*9/L Final   ??? Absolute Lymphocytes 06/26/2020 0.6 (A) 1.1 - 3.6 10*9/L Final   ??? Absolute Monocytes 06/26/2020 0.8  0.3 - 0.8 10*9/L Final   ??? Absolute Eosinophils 06/26/2020 0.0  0.0 - 0.5 10*9/L Final   ??? Absolute Basophils 06/26/2020 0.0  0.0 - 0.1 10*9/L Final   ??? Macrocytosis 06/26/2020 Slight (A) Not Present Final     Gynecologic Oncology    Gynecologic Oncology Metrics:      Dose and Schedule: Cyclophosphamide 50 mg PO daily + pembrolizumab 200 mg IV q3wk + bevacizumab 15 mg/kg IV q3wk     Chemotherapy Dose: Dose Documented     Chemotherapy Schedule: Schedule documented     NCI CTCAE: Anemia - Grade 2, AST - Grade 1, Serum creatinine - Grade 2, Alk Phos - Grade 2, Fatigue - Grade 2        Interventions: Follow-up with healthcare provider

## 2020-06-27 DIAGNOSIS — C5700 Malignant neoplasm of unspecified fallopian tube: Principal | ICD-10-CM

## 2020-06-27 NOTE — Unmapped (Signed)
TC to pt in response to a mychart message regarding pain and fatigue. Pt was unavailable, and a voicemail was left with my contact information.

## 2020-06-28 ENCOUNTER — Ambulatory Visit: Admit: 2020-06-28 | Discharge: 2020-06-29 | Payer: MEDICARE

## 2020-06-28 DIAGNOSIS — M810 Age-related osteoporosis without current pathological fracture: Principal | ICD-10-CM

## 2020-06-28 DIAGNOSIS — C5700 Malignant neoplasm of unspecified fallopian tube: Principal | ICD-10-CM

## 2020-06-28 MED ORDER — OXYBUTYNIN CHLORIDE 5 MG TABLET
ORAL_TABLET | Freq: Three times a day (TID) | ORAL | 3 refills | 30 days | Status: CP
Start: 2020-06-28 — End: ?

## 2020-06-28 MED ORDER — OMEPRAZOLE 20 MG CAPSULE,DELAYED RELEASE
ORAL_CAPSULE | Freq: Every day | ORAL | 3 refills | 90 days | Status: CP
Start: 2020-06-28 — End: 2021-06-28

## 2020-06-28 MED ORDER — POLYETHYLENE GLYCOL 3350 17 GRAM ORAL POWDER PACKET
PACK | ORAL | 2 refills | 96 days | Status: CP
Start: 2020-06-28 — End: ?

## 2020-06-28 MED ORDER — MEGESTROL 400 MG/10 ML (10 ML) ORAL SUSPENSION
Freq: Every morning | ORAL | 3 refills | 30 days | Status: CP
Start: 2020-06-28 — End: ?

## 2020-06-28 MED ORDER — METOCLOPRAMIDE 10 MG TABLET
ORAL_TABLET | Freq: Three times a day (TID) | ORAL | 1 refills | 30 days | Status: CP | PRN
Start: 2020-06-28 — End: 2021-06-28

## 2020-06-28 NOTE — Unmapped (Addendum)
It was a pleasure to see you today! Thank you for choosing [facility] Gynecologic Oncology.    Today we discussed:    It sounds like stopping your medications has worsened some of the symptoms that bother you most. I am recommending the following medications for your symptoms:    1. Mood: Continue the Venlafaxine and keep communicating with your psychiatrist. This will take some time to build up in your system and cause a noticeable difference. You should also work to get outside once a day for a short walk and to connect with your granddaughters and family.    2. Appetite/Nausea/Reflux: Please start using Omeprazole (daily) and Reglan (3 x a day as needed). These will help with acid reflux and nausea. Reglan can also help the bowels work. For appetite, we will start a medication called Megace in the morning. You should have something small to eat at minimum, 3 times a day with the Reglan and Tylenol. Ideally 5-6 small meals should help get your weight back up.    3. Constipation: We need to work on this to help with appetite. The Reglan will help a bit. I would also like you to talk a half dose of Miralax (8.5g) every OTHER day. If you develop diarrhea, skip 2 days. Please continue to hydrate well. Do NOT use the Zofran you have at home.    4. Pain: Please start Aleve 1 tablet in the morning and Tylenol 1000mg  every 8 hours. Baths are another great way to help this, as is getting out of bed more frequently. Again, a short daily walk will help.    5. Incontinence: It sounds like this was well controlled with the Oxybutynin. Please re-start this med. I am ordering refills.    If you have any questions or concerns, please do not hesitate to contact us.       Business Hours:  Physician Nurse Clinician Phone Number   Dr. Yetta Flock, RN [phone]   Dr. Domingo Dimes, RN  [phone]   Dr. Andre Lefort, RN  [phone]   Dr. Tarri Abernethy, RN [phone]   Dr. Curlene Dolphin, RN [phone]   Dr. Gypsy Decant, RN [phone]   Dr. Tamela Gammon, RN [phone]   Dr. Charline Bills, RN [phone]   [employer] Farrel Demark, RN [phone]     Scheduling: [phone]  Fax: [phone]     After hours and on weekends:  [phone]; ask for the resident on call for Gyn Oncology

## 2020-06-28 NOTE — Unmapped (Signed)
Addended by: Leandra Kern on: 06/28/2020 09:39 AM     Modules accepted: Orders

## 2020-06-28 NOTE — Unmapped (Signed)
Gynecologic Oncology Return Clinic Visit    June 28, 2020 9:21 AM      Reason for Visit: Fatigue, weight loss    Treatment History:  Oncology History   Fallopian tube cancer, carcinoma (CMS-HCC)   06/17/2017 Initial Diagnosis    CT:  Findings compatible with ovarian carcinoma with bilateral suspicious appearing ovarian lesions, ascites, and peritoneal thickening, predominantly within the posterior cul-de-sac, concerning for metastatic spread.   ??  06/17/17: CA 125 129     07/01/2017 Surgery    Procedure on 07/01/17: Diagnostic laparoscopy, conversion to exploratory laparotomy, tumor debulking with total hysterectomy, bilateral salpingo-oophorectomy, infragastric omentectomy, Argon beam ablation of tumor nodules, enterolysis    Operative Findings: On diagnostic laparoscopy, Lisa Burton score 6 (Diaphragmatic disease, Omental disease, Liver disease) though disease appeared small volume in the upper abdomen and primarily pelvic.  Ascites present. Optimal debulking was felt feasible. After laparotomy, 6x4cm plaque in the portahepatis noted which was not debulkable (argon laser coagulated). We noted a scant miliary disease along diaphragm, peritoneum, small bowel mesentery (residual). Ovaries densely adherent to multiple adherent redundant loops of rectosigmoid colon (no evidence of obstruction), and posterior uterus. Thin plaque of tumor along rectosigmoid colon and 3x3cm nodule at base of rectosigmoid mesentery(residual). Tumor nodules along the bladder reflection (resected).  No other disease noted on full abdominopelvic survey. Optimal debulking would have required debulking the porta hepatis, rectosigmoid resection, peritoneal stripping in many locations. 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 Consideration of bevacizumab versus enrollment in ATHENA trial as maintenance. Referral to genetics. BRCA immunostaining pending  ??     07/18/2017 - 11/17/2017 Chemotherapy    Chemotherapy Treatment    Treatment Goal Curative   Line of Treatment First Line   Plan Name OP OVARIAN PACLITAXEL/CARBOPLATIN   Start Date 07/21/2017   End Date 11/17/2017   Provider Ermelinda Das, MD   Chemotherapy dexamethasone (DECADRON) tablet 12 mg, 12 mg, Oral, Once, 6 of 6 cycles  Administration: 12 mg (07/21/2017), 12 mg (08/11/2017), 12 mg (09/05/2017), 12 mg (09/29/2017), 12 mg (10/28/2017), 12 mg (11/17/2017)  CARBOplatin (PARAPLATIN) 580.8 mg in sodium chloride (NS) 0.9 % 100 mL IVPB, 580.8 mg (114.2 % of original dose 508.8 mg), Intravenous, Once, 6 of 6 cycles  Dose modification:   (original dose 508.8 mg, Cycle 1)  Administration: 580.8 mg (07/21/2017), 528.6 mg (08/11/2017), 517.8 mg (09/05/2017), 529.8 mg (09/29/2017), 493.2 mg (10/28/2017), 498 mg (11/17/2017)  PACLItaxel (TAXOL) 286.98 mg in sodium chloride 0.9% NON-PVC (NS) 0.9 % 500 mL IVPB, 175 mg/m2 = 286.98 mg, Intravenous, Once, 6 of 6 cycles  Administration: 286.98 mg (07/21/2017), 285.24 mg (08/11/2017), 276.48 mg (09/05/2017), 278.28 mg (09/29/2017), 281.76 mg (10/28/2017), 283.5 mg (11/17/2017)          01/16/2018 Genetics    [facility] panel. Genetic testing is negative.     08/25/2018 Recurrence    Elevated CA-125 and imaging consistent with recurrent disease.       09/04/2018 - 06/07/2019 Chemotherapy    OP OVARIAN DOXORUBICIN LIPOSOMAL/CARBOPLATIN  DOXOrubicin Liposome 30 mg/m2 IV on day 1, CARBOplatin AUC 5 IV on day 1, every 28 days       08/20/2019 - 03/13/2020 Chemotherapy    niraparib     04/20/2020 - 05/26/2020 Chemotherapy    olaparib     05/15/2020 Recurrence    + ct  in ER with liver and peritoneal metastasis. We were not aware of the scan until 06/05/20.       06/12/2020 -  Chemotherapy    OP CERVICAL PEMBROLIZUMAB  pembrolizumab 200 mg IV on day 1, every 21 days       Interval History:    Lisa Burton called yesterday to report that her fatigue, weakness and poor appetite continue unabated. She had a recurrence of her fallopian tube cancer in late January and started cyclophosphamide/pembrolizumab/bevacizumab in February. She has had 1 infusion and reports taking her Cytoxan daily.     Today the patient reports that around the time of her recurrence, she began to have total body pain/aching and very low energy along with anorexia. The news of her recurrence was shocking to her and she stopped all medications aside from Cytoxan. Since that time, her symptoms have not improved(but have not worsened).   For body pain she takes 2-3 baths a day but has not tried any anti-inflammatories or pain medication. She reports she also has some vague nausea and reflux. She states she has no desire to eat and can easily go 24 hours without solid foods. States she does hydrate. She has lost at least 15lb in the last 6 weeks. She reports constipation but Miralax previously gave her diarrhea so she is not treating this now. Last BM was very small this morning, but had not had a BM for 4 days prior to this AM. She reports she sleeps approximately 16 hours a day though last Saturday slept 22 hours. She does get up to the bathroom.   Two weeks ago she did reach out to her psychiatrist and she is not back on Venlafaxine at 300mg  daily. She reports she is 'incontinent' and now must wear depends. This is new since stopping oxybutynin.     The patient volunteers that she is not depressed, however she admits that she has been thinking a lot about her death. She states her husband is in denial of her illness and she has previously cared for him/managed the household and worries how he will manage when she dies. She has derived great joy from zoom calls with her granddaughters on the [address], however she has not found th energy to do this in several weeks. They are young and she is grieving the fact that she will likely not live to see their batmitzvahs in 10-12 years.    Past Medical/Surgical History:  Past Medical History:   Diagnosis Date   ??? ADHD (attention deficit hyperactivity disorder)    ??? Anxiety    ??? At risk for falls    ??? Baker's cyst    ??? Fractures    ??? Hypersomnia    ??? Hypertension    ??? Injury of posterior cruciate ligament    ??? Insomnia    ??? Irritable colon 07/10/2010   ??? Joint pain    ??? Joint replaced    ??? Major depressive disorder    ??? OSA (obstructive sleep apnea)    ??? Osteoarthritis    ??? Osteoporosis    ??? Ovarian cancer (CMS-HCC) 07/03/2017   ??? Personality disorder (CMS-HCC)     Borderline Personality Disorder   ??? PLMD (periodic limb movement disorder)    ??? Tear of meniscus of knee    ??? Urge incontinence 11/12/2012   ??? Vestibular dysfunction 09/16/2013       Past Surgical History:   Procedure Laterality Date   ??? CESAREAN SECTION  1981   ???  FINGER FRACTURE SURGERY Right 10/2013    thumb   ??? FOOT SURGERY Bilateral 9/00, 01/14    hardware   ??? IR INSERT PORT AGE GREATER THAN 5 YRS  07/04/2017    IR INSERT PORT AGE GREATER THAN 5 YRS 07/04/2017 [name], MD IMG VIR [facility]   ??? PR BIL SALP-OOPH W/OMENTECT,TAH,RAD DISSECT Bilateral 07/01/2017    Procedure: BILATERAL SALPINGO-OOPHORECTOMY W/OMENTECTOMY, TOTAL ABD HYSTERECTOMY & RADICAL DISSECTION FOR DEBULKING;  Surgeon: Ermelinda Das, MD;  Location: MAIN OR [facility];  Service: Gynecology Oncology   ??? PR LAP,DIAGNOSTIC ABDOMEN N/A 07/01/2017    Procedure: Laparoscopy, Abdomen, Peritoneum, & Omentum, Diagnostic, W/Wo Collection Specimen(S) By Brushing Or Washing;  Surgeon: Ermelinda Das, MD;  Location: MAIN OR [facility];  Service: Gynecology Oncology   ??? 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 TONSILLECTOMY  1970   ??? WRIST FRACTURE SURGERY Right 1998    wrist       Family History   Problem Relation Age of Onset   ??? Personality disorder Sister    ??? Cancer Father         GI cancer 83's   ??? Arthritis Father    ??? Depression Son    ??? Arthritis Sister    ??? Arthritis Mother    ??? ADD / ADHD Neg Hx    ??? Alcohol abuse Neg Hx    ??? Anxiety disorder Neg Hx    ??? Bipolar disorder Neg Hx    ??? Dementia Neg Hx    ??? Drug abuse Neg Hx    ??? OCD Neg Hx    ??? Paranoid behavior Neg Hx    ??? Physical abuse Neg Hx    ??? Schizophrenia Neg Hx    ??? Seizures Neg Hx    ??? Sexual abuse Neg Hx    ??? Sleep disorder Neg Hx        Social History     Socioeconomic History   ??? Marital status: Married     Spouse name: Not on file   ??? Number of children: 1   ??? Years of education: Not on file   ??? Highest education level: Not on file   Occupational History   ??? Occupation: retired IT trainer   Tobacco Use   ??? Smoking status: Never Smoker   ??? Smokeless tobacco: Never Used   Vaping Use   ??? Vaping Use: Never used   Substance and Sexual Activity   ??? Alcohol use: Yes     Alcohol/week: 1.0 - 2.0 standard drink     Types: 1 - 2 Shots of liquor per week   ??? Drug use: No   ??? Sexual activity: Not on file   Other Topics Concern   ??? 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  Partner is somewhat older in 59's and they live together in [place]. She has a son who is a Clinical research associate, and does litigation in [place].  He went to [employer].  She also has a granddaughter only 12 year old in 2015.    12/10/16        PCMH Components:        Family, social, cultural characteristics: social support includes  Odiss MCGrew her partner .      Patient has the following communication needs: none, per patient  Health Literacy: How confident are you that you understand your health issues/concerns, can participate in your care, and manage your care along with your physician: confident.    Behaviors Affecting Health: none, per patient    Family history of mental health illness and/or substance abuse: mental health illness: yes, as noted in family history. Sister with personality disorder    Have you been seen by any medical provider that we have not referred you to since your last visit ? No    Discussed a Living Will with the patient and the : Patient interested in education materials about a living will. Materials provided at today's visit.      Social Determinants of Health     Financial Resource Strain: Low Risk    ??? Difficulty of Paying Living Expenses: Not hard at all   Food Insecurity: No Food Insecurity   ??? Worried About Programme researcher, broadcasting/film/video in the Last Year: Never true   ??? Ran Out of Food in the Last Year: Never true   Transportation Needs: No Transportation Needs   ??? Lack of Transportation (Medical): No   ??? Lack of Transportation (Non-Medical): No   Physical Activity: Unknown   ??? Days of Exercise per Week: 0 days   ??? Minutes of Exercise per Session: Not on file   Stress: No Stress Concern Present   ??? Feeling of Stress : Not at all   Social Connections: Socially Integrated   ??? Frequency of Communication with Friends and Family: More than three times a week   ??? Frequency of Social Gatherings with Friends and Family: More than three times a week   ??? Attends Religious Services: More than 4 times per year   ??? Active Member of Clubs or Organizations: Yes   ??? Attends Banker Meetings: More than 4 times per year   ??? Marital Status: Married       Current Medications:    Current Outpatient Medications:   ???  amoxicillin (AMOXIL) 500 MG tablet, Take 4 tablets (2,000 mg total) by mouth once for 1 dose. Prior to dental cleaning, Disp: 4 tablet, Rfl: 0  ???  ARIPiprazole (ABILIFY) 10 MG tablet, Take 10 mg by mouth daily. , Disp: , Rfl:   ???  ascorbic acid, vitamin C, (VITAMIN C) 1000 MG tablet, Take 1,000 mg by mouth daily. , Disp: , Rfl:   ???  atorvastatin (LIPITOR) 10 MG tablet, Take 1 tablet (10 mg total) by mouth nightly., Disp: 90 tablet, Rfl: 3  ???  CALCIUM ORAL, Take by mouth daily. , Disp: , Rfl:   ???  cholecalciferol, vitamin D3, (VITAMIN D3) 1,000 unit capsule, Take 1,000 Units by mouth daily. , Disp: , Rfl:   ???  cyclophosphamide (CYTOXAN) 50 mg capsule, Take 1 capsule (50 mg total) by mouth daily ., Disp: 30 capsule, Rfl: 5  ???  latanoprost (XALATAN) 0.005 % ophthalmic solution, , Disp: , Rfl:   ???  levomefol-B6-meB12-algal oil (METANX, ALGAL OIL,) 3 mg-35 mg-2 mg -90.314 mg capsule, Take 2 capsules by mouth daily., Disp: 60 capsule, Rfl: 6  ???  lisinopriL (PRINIVIL,ZESTRIL) 20 MG tablet, Take 1 tablet (20 mg total) by mouth daily., Disp: 90 tablet, Rfl: 1  ???  megestroL (MEGACE) 400 mg/10 mL (10 mL) Susp oral suspension, Take 20 mL (800 mg total) by mouth every morning., Disp: 600 mL, Rfl: 3  ???  metoclopramide (REGLAN) 10 MG tablet, Take 1 tablet (10 mg total) by mouth Three (3) times  a day as needed., Disp: 90 tablet, Rfl: 1  ???  omeprazole (PRILOSEC) 20 MG capsule, Take 1 capsule (20 mg total) by mouth daily., Disp: 90 capsule, Rfl: 3  ???  ondansetron (ZOFRAN) 8 MG tablet, Take 1 tablet (8 mg total) by mouth every eight (8) hours as needed for nausea., Disp: 30 tablet, Rfl: 2  ???  oxybutynin (DITROPAN) 5 MG tablet, Take 1 tablet (5 mg total) by mouth Three (3) times a day., Disp: 90 tablet, Rfl: 3  ???  polyethylene glycol (MIRALAX) 17 gram packet, Take 8.5 g by mouth every other day. Half a packet every other day, Disp: 24 packet, Rfl: 2  ???  sodium fluoride-pot nitrate 1.1-5 % Pste, BRUSH WITH A PEA SIZED AMOUNT TWICE A DAY FOR 2 MINUTES, Disp: , Rfl:   ???  traZODone (DESYREL) 100 MG tablet, Take 100 mg by mouth nightly. , Disp: , Rfl:   ???  venlafaxine (EFFEXOR-XR) 150 MG 24 hr capsule, Take 2 capsules (300 mg total) by mouth every morning., Disp: 180 capsule, Rfl: 3    Review of Symptoms:  Complete 10-system review is negative except as above in Interval History.    Physical Exam:  BP 155/80  - Pulse 94  - Temp 35.6 ??C (96 ??F) (Temporal)  - Ht 154.9 cm (5' 1)  - Wt 53 kg (116 lb 12.8 oz)  - SpO2 98%  - BMI 22.07 kg/m??   General: Alert, oriented, no acute distress. Very thin with temporal wasting  HEENT: Normocephalic, atraumatic. Sclera anicteric.   Chest: Normal WOB, no audible wheeze  Cardiovascular: No peripheral edema.   Abdomen: flat  Extremities: Grossly normal range of motion. Warm, well perfused.    Skin: No rashes or lesions.  Neuro: A&O x 4, normal strength and gait  Psych: no psychomotor retardation, displays full range of emotions    Laboratory & Radiologic Studies:    No new studies    Assessment & Plan:  Lisa Burton is a 75 y.o. woman with recurrent fallopian tube cancer who presents for severe fatigue, anorexia and body pain.    I held an extensive discussion with the patient today and she was also counseled by nurse clinician Lyn Wyman Songster. She truly appears depressed and while she has re-started Venlafaxine, we discussed that she will need to also make several medication and lifestyle changes in order to improve and preserve her functional status. We made the following plan.      1. Mood: Continue the Venlafaxine and communication with psychiatry. We discussed pushing herself to connect with family and friends and to get outside daily.    2. Appetite/Nausea/Reflux: Rx for daily Omeprazole and Reglan prn. Megace 800mg  qAM. Focus on small meals and work up to 5-6 of these a day.    3. Constipation: Rx for half dose of Miralax (8.5g) every other day. Continued hydration and avoidance of zofran due to constipating side effects.    4. Pain: Recommended Aleve 1 tablet in the morning and Tylenol 1000mg  every 8 hours. Encouraged getting out of bed and light exercise/walking daily.    5. Incontinence: Re-starting oxybutynin 5mg  QID as she had no incontinence prior to stopping this med.    The patient's questions were answered to the best of my ability today. I am worried about her ability to improve and maintain her functional status and I have messaged Dr. Duard Brady and Dr. Yetta Barre of pharmacy.      Dr. Verlin Dike, was available  Terrilee Croak, MD  Clinical Fellow, Division of Gynecologic Oncology  Department of Obstetrics and Gynecology  [facility]

## 2020-06-28 NOTE — Unmapped (Signed)
error 

## 2020-07-03 ENCOUNTER — Other Ambulatory Visit: Admit: 2020-07-03 | Discharge: 2020-07-04 | Payer: MEDICARE

## 2020-07-03 ENCOUNTER — Ambulatory Visit: Admit: 2020-07-03 | Discharge: 2020-07-04 | Payer: MEDICARE

## 2020-07-03 DIAGNOSIS — E038 Other specified hypothyroidism: Principal | ICD-10-CM

## 2020-07-03 DIAGNOSIS — C5700 Malignant neoplasm of unspecified fallopian tube: Principal | ICD-10-CM

## 2020-07-03 DIAGNOSIS — Z5111 Encounter for antineoplastic chemotherapy: Principal | ICD-10-CM

## 2020-07-03 LAB — COMPREHENSIVE METABOLIC PANEL
ALBUMIN: 3.1 g/dL — ABNORMAL LOW (ref 3.4–5.0)
ALKALINE PHOSPHATASE: 375 U/L — ABNORMAL HIGH (ref 46–116)
ALT (SGPT): 39 U/L (ref 10–49)
ANION GAP: 10 mmol/L (ref 5–14)
AST (SGOT): 58 U/L — ABNORMAL HIGH (ref <=34)
BILIRUBIN TOTAL: 0.3 mg/dL (ref 0.3–1.2)
BLOOD UREA NITROGEN: 44 mg/dL — ABNORMAL HIGH (ref 9–23)
BUN / CREAT RATIO: 22
CALCIUM: 9.2 mg/dL (ref 8.7–10.4)
CHLORIDE: 106 mmol/L (ref 98–107)
CO2: 19.2 mmol/L — ABNORMAL LOW (ref 20.0–31.0)
CREATININE: 1.98 mg/dL — ABNORMAL HIGH
EGFR CKD-EPI AA FEMALE: 28 mL/min/{1.73_m2} — ABNORMAL LOW (ref >=60)
EGFR CKD-EPI NON-AA FEMALE: 24 mL/min/{1.73_m2} — ABNORMAL LOW (ref >=60)
GLUCOSE RANDOM: 96 mg/dL (ref 70–179)
POTASSIUM: 4.8 mmol/L (ref 3.4–4.8)
PROTEIN TOTAL: 6.3 g/dL (ref 5.7–8.2)
SODIUM: 135 mmol/L (ref 135–145)

## 2020-07-03 LAB — CBC W/ AUTO DIFF
BASOPHILS ABSOLUTE COUNT: 0 10*9/L (ref 0.0–0.1)
BASOPHILS RELATIVE PERCENT: 0.7 %
EOSINOPHILS ABSOLUTE COUNT: 0 10*9/L (ref 0.0–0.5)
EOSINOPHILS RELATIVE PERCENT: 0.7 %
HEMATOCRIT: 26.3 % — ABNORMAL LOW (ref 34.0–44.0)
HEMOGLOBIN: 9.2 g/dL — ABNORMAL LOW (ref 11.3–14.9)
LYMPHOCYTES ABSOLUTE COUNT: 0.5 10*9/L — ABNORMAL LOW (ref 1.1–3.6)
LYMPHOCYTES RELATIVE PERCENT: 10.2 %
MEAN CORPUSCULAR HEMOGLOBIN CONC: 35 g/dL (ref 32.0–36.0)
MEAN CORPUSCULAR HEMOGLOBIN: 37.5 pg — ABNORMAL HIGH (ref 25.9–32.4)
MEAN CORPUSCULAR VOLUME: 107.1 fL — ABNORMAL HIGH (ref 77.6–95.7)
MEAN PLATELET VOLUME: 7.7 fL (ref 6.8–10.7)
MONOCYTES ABSOLUTE COUNT: 0.7 10*9/L (ref 0.3–0.8)
MONOCYTES RELATIVE PERCENT: 13.8 %
NEUTROPHILS ABSOLUTE COUNT: 3.7 10*9/L (ref 1.8–7.8)
NEUTROPHILS RELATIVE PERCENT: 74.6 %
PLATELET COUNT: 170 10*9/L (ref 150–450)
RED BLOOD CELL COUNT: 2.46 10*12/L — ABNORMAL LOW (ref 3.95–5.13)
RED CELL DISTRIBUTION WIDTH: 16.1 % — ABNORMAL HIGH (ref 12.2–15.2)
WBC ADJUSTED: 5 10*9/L (ref 3.6–11.2)

## 2020-07-03 LAB — MAGNESIUM: MAGNESIUM: 1.3 mg/dL — ABNORMAL LOW (ref 1.6–2.6)

## 2020-07-03 LAB — TSH: THYROID STIMULATING HORMONE: 1.573 u[IU]/mL (ref 0.550–4.780)

## 2020-07-03 LAB — T4, FREE: FREE T4: 1.37 ng/dL (ref 0.89–1.76)

## 2020-07-03 MED ADMIN — sodium chloride (NS) 0.9 % infusion: 100 mL/h | INTRAVENOUS | @ 21:00:00

## 2020-07-03 MED ADMIN — heparin, porcine (PF) 100 unit/mL injection 500 Units: 500 [IU] | INTRAVENOUS | @ 22:00:00 | Stop: 2020-07-04

## 2020-07-03 MED ADMIN — pembrolizumab (KEYTRUDA) 200 mg in sodium chloride (NS) 0.9 % 50 mL IVPB: 200 mg | INTRAVENOUS | @ 22:00:00 | Stop: 2020-07-03

## 2020-07-03 MED ADMIN — bevacizumab-awwb (MVASI) 852 mg in sodium chloride (NS) 0.9 % 100 mL IVPB: 15 mg/kg | INTRAVENOUS | @ 21:00:00 | Stop: 2020-07-03

## 2020-07-03 NOTE — Unmapped (Signed)
Labs found to be within parameters for treatment today. Request for drug sent to pharmacy.

## 2020-07-03 NOTE — Unmapped (Unsigned)
Patient arrived for port access and lab draw. Labs collected and sent for analysis. Patient tolerated well, discharged to lab waiting area.

## 2020-07-04 NOTE — Unmapped (Signed)
Pt in unit for immune therapy treatment per orders; pt resting in chair, call light in reach. Orders reviewed. Infusions completed with no complications noted. Pt left ambulatory.

## 2020-07-05 NOTE — Unmapped (Signed)
Specialty Medication Follow-up    Lisa Burton is a 75 y.o. female with recurrent ovarian cancer who I am seeing for follow up on their treatment with pembrolizumab, bevacizumab, and oral cyclophosphamide.     Chemotherapy: Cyclophosphamide 50 mg PO daily   Start date: 06/12/20    A/P:   1. Oral Chemotherapy: CBC w/diff and CMP reviewed. Grade 2 anemia and grade 2 serum creatinine elevation both of which have worsened.  Grade 1 AST elevation and grade 2 fatigue both of which are stable. Grade 2 alk phos elevation which has improved. No grade 3 toxicities therefore will continue at current dose intensity. Will repeat labs next week prior to infusion.   ?? Continue cyclophosphamide 50 mg PO daily  ?? Obtain CBC w/diff, CMP, and Mag in 1 week    I spent approximately 10 minutes in direct patient care.    Next follow up: In 1 week with lab results (07/10/20)    Referring physician: Dr. Trey Paula, PharmD, BCOP, CPP  [facility] Pharmacist  Pager: [phone]    S/O: Lisa Burton presents to infusion for immunotherapy. She endorses that overall she is feeling a bit better. She continues to experience fatigue and weakness but is trying to be more active throughout the day. She reports that she is taking 1/2 cap of miralax and this allows for daily BMs although they are small. She reports that although her appetite has not really improved she is forcing herself to eat a bit more throughout the day. She reports that her pain is improved with acetaminophen administration. She continues to struggle with urinary incontinence despite restarting oxybutynin therapy. She denies rash or itching. Her blood pressure upon presentation was 140/85 mmHg.     Medications reviewed and updated in EPIC? No    Missed doses: ----    Labs  Lab on 07/03/2020   Component Date Value Ref Range Status   ??? Sodium 07/03/2020 135  135 - 145 mmol/L Final   ??? Potassium 07/03/2020 4.8  3.4 - 4.8 mmol/L Final   ??? Chloride 07/03/2020 106  98 - 107 mmol/L Final   ??? Anion Gap 07/03/2020 10  5 - 14 mmol/L Final   ??? CO2 07/03/2020 19.2 (A) 20.0 - 31.0 mmol/L Final   ??? BUN 07/03/2020 44 (A) 9 - 23 mg/dL Final   ??? Creatinine 07/03/2020 1.98 (A) 0.60 - 0.80 mg/dL Final   ??? BUN/Creatinine Ratio 07/03/2020 22   Final   ??? EGFR CKD-EPI Non-African American,* 07/03/2020 24 (A) >=60 mL/min/1.49m2 Final   ??? EGFR CKD-EPI African American, Fem* 07/03/2020 28 (A) >=60 mL/min/1.1m2 Final   ??? Glucose 07/03/2020 96  70 - 179 mg/dL Final   ??? Calcium 45/40/9811 9.2  8.7 - 10.4 mg/dL Final   ??? Albumin 91/47/8295 3.1 (A) 3.4 - 5.0 g/dL Final   ??? Total Protein 07/03/2020 6.3  5.7 - 8.2 g/dL Final   ??? Total Bilirubin 07/03/2020 0.3  0.3 - 1.2 mg/dL Final   ??? AST 62/13/0865 58 (A) <=34 U/L Final   ??? ALT 07/03/2020 39  10 - 49 U/L Final   ??? Alkaline Phosphatase 07/03/2020 375 (A) 46 - 116 U/L Final   ??? TSH 07/03/2020 1.573  0.550 - 4.780 uIU/mL Final   ??? Free T4 07/03/2020 1.37  0.89 - 1.76 ng/dL Final   ??? Magnesium 78/46/9629 1.3 (A) 1.6 - 2.6 mg/dL Final   ??? WBC 52/84/1324 5.0  3.6 - 11.2 10*9/L  Final   ??? RBC 07/03/2020 2.46 (A) 3.95 - 5.13 10*12/L Final   ??? HGB 07/03/2020 9.2 (A) 11.3 - 14.9 g/dL Final   ??? HCT 16/01/9603 26.3 (A) 34.0 - 44.0 % Final   ??? MCV 07/03/2020 107.1 (A) 77.6 - 95.7 fL Final   ??? MCH 07/03/2020 37.5 (A) 25.9 - 32.4 pg Final   ??? MCHC 07/03/2020 35.0  32.0 - 36.0 g/dL Final   ??? RDW 54/12/8117 16.1 (A) 12.2 - 15.2 % Final   ??? MPV 07/03/2020 7.7  6.8 - 10.7 fL Final   ??? Platelet 07/03/2020 170  150 - 450 10*9/L Final   ??? Neutrophils % 07/03/2020 74.6  % Final   ??? Lymphocytes % 07/03/2020 10.2  % Final   ??? Monocytes % 07/03/2020 13.8  % Final   ??? Eosinophils % 07/03/2020 0.7  % Final   ??? Basophils % 07/03/2020 0.7  % Final   ??? Absolute Neutrophils 07/03/2020 3.7  1.8 - 7.8 10*9/L Final   ??? Absolute Lymphocytes 07/03/2020 0.5 (A) 1.1 - 3.6 10*9/L Final   ??? Absolute Monocytes 07/03/2020 0.7  0.3 - 0.8 10*9/L Final   ??? Absolute Eosinophils 07/03/2020 0.0 0.0 - 0.5 10*9/L Final   ??? Absolute Basophils 07/03/2020 0.0  0.0 - 0.1 10*9/L Final   ??? Macrocytosis 07/03/2020 Slight (A) Not Present Final   ??? Anisocytosis 07/03/2020 Slight (A) Not Present Final   ??? Spec Gravity/POC 07/03/2020 1.020  1.003 - 1.030 Final   ??? PH/POC 07/03/2020 5.0  5.0 - 9.0 Final   ??? Leuk Esterase/POC 07/03/2020 Trace (A) Negative Final   ??? Nitrite/POC 07/03/2020 Negative  Negative Final   ??? Protein/POC 07/03/2020 1+ (A) Negative Final   ??? UA Glucose/POC 07/03/2020 Negative  Negative Final   ??? Ketones, POC 07/03/2020 Trace (A) Negative Final   ??? Bilirubin/POC 07/03/2020 1+ (A) Negative Final   ??? Blood/POC 07/03/2020 Negative  Negative Final   ??? Urobilinogen/POC 07/03/2020 1.0  0.2 - 1.0 mg/dL Final     Gynecologic Oncology    Gynecologic Oncology Metrics:      Dose and Schedule: Cyclophosphamide 50 mg PO daily + pembrolizumab 200 mg IV q3wk + bevacizumab 15 mg/kg IV q3wk     Chemotherapy Dose: Dose Documented     Chemotherapy Schedule: Schedule documented     NCI CTCAE: Fatigue - Grade 2, Anemia - Grade 2, AST - Grade 1, Alk Phos - Grade 2, Serum creatinine - Grade 2

## 2020-07-05 NOTE — Unmapped (Signed)
Encounter addended by: Langston Reusing, CPP on: 07/05/2020 2:29 PM   Actions taken: SmartForm saved, Flowsheet accepted, Clinical Note Signed, Charge Capture section accepted

## 2020-07-06 ENCOUNTER — Ambulatory Visit: Admit: 2020-07-06 | Discharge: 2020-07-08 | Payer: MEDICARE

## 2020-07-06 LAB — URINALYSIS WITH CULTURE REFLEX
BILIRUBIN UA: NEGATIVE
BLOOD UA: NEGATIVE
GLUCOSE UA: NEGATIVE
KETONES UA: NEGATIVE
LEUKOCYTE ESTERASE UA: NEGATIVE
NITRITE UA: NEGATIVE
PH UA: 5 (ref 5.0–9.0)
PROTEIN UA: 30 — AB
RBC UA: <1 /HPF (ref <4)
SPECIFIC GRAVITY UA: 1.025 (ref 1.005–1.040)
SQUAMOUS EPITHELIAL: 2 /HPF (ref 0–5)
UROBILINOGEN UA: 1
WBC UA: <1 /HPF (ref 0–5)

## 2020-07-06 LAB — CBC W/ AUTO DIFF
BASOPHILS ABSOLUTE COUNT: 0 10*9/L (ref 0.0–0.1)
BASOPHILS RELATIVE PERCENT: 0.5 %
EOSINOPHILS ABSOLUTE COUNT: 0 10*9/L (ref 0.0–0.5)
EOSINOPHILS RELATIVE PERCENT: 0.4 %
HEMATOCRIT: 29.4 % — ABNORMAL LOW (ref 34.0–44.0)
HEMOGLOBIN: 10 g/dL — ABNORMAL LOW (ref 11.3–14.9)
LYMPHOCYTES ABSOLUTE COUNT: 0.7 10*9/L — ABNORMAL LOW (ref 1.1–3.6)
LYMPHOCYTES RELATIVE PERCENT: 11.5 %
MEAN CORPUSCULAR HEMOGLOBIN CONC: 33.9 g/dL (ref 32.0–36.0)
MEAN CORPUSCULAR HEMOGLOBIN: 36.6 pg — ABNORMAL HIGH (ref 25.9–32.4)
MEAN CORPUSCULAR VOLUME: 108.2 fL — ABNORMAL HIGH (ref 77.6–95.7)
MEAN PLATELET VOLUME: 8.1 fL (ref 6.8–10.7)
MONOCYTES ABSOLUTE COUNT: 1.2 10*9/L — ABNORMAL HIGH (ref 0.3–0.8)
MONOCYTES RELATIVE PERCENT: 19 %
NEUTROPHILS ABSOLUTE COUNT: 4.4 10*9/L (ref 1.8–7.8)
NEUTROPHILS RELATIVE PERCENT: 68.6 %
NUCLEATED RED BLOOD CELLS: 0 /100{WBCs} (ref <=4)
PLATELET COUNT: 157 10*9/L (ref 150–450)
RED BLOOD CELL COUNT: 2.72 10*12/L — ABNORMAL LOW (ref 3.95–5.13)
RED CELL DISTRIBUTION WIDTH: 16.6 % — ABNORMAL HIGH (ref 12.2–15.2)
WBC ADJUSTED: 6.4 10*9/L (ref 3.6–11.2)

## 2020-07-06 LAB — COMPREHENSIVE METABOLIC PANEL
ALBUMIN: 3.3 g/dL — ABNORMAL LOW (ref 3.4–5.0)
ALKALINE PHOSPHATASE: 341 U/L — ABNORMAL HIGH (ref 46–116)
ALT (SGPT): 31 U/L (ref 10–49)
ANION GAP: 11 mmol/L (ref 5–14)
AST (SGOT): 47 U/L — ABNORMAL HIGH (ref <=34)
BILIRUBIN TOTAL: 0.6 mg/dL (ref 0.3–1.2)
BLOOD UREA NITROGEN: 35 mg/dL — ABNORMAL HIGH (ref 9–23)
BUN / CREAT RATIO: 22
CALCIUM: 9.1 mg/dL (ref 8.7–10.4)
CHLORIDE: 104 mmol/L (ref 98–107)
CO2: 18.8 mmol/L — ABNORMAL LOW (ref 20.0–31.0)
CREATININE: 1.57 mg/dL — ABNORMAL HIGH
EGFR CKD-EPI AA FEMALE: 37 mL/min/{1.73_m2} — ABNORMAL LOW (ref >=60)
EGFR CKD-EPI NON-AA FEMALE: 32 mL/min/{1.73_m2} — ABNORMAL LOW (ref >=60)
GLUCOSE RANDOM: 99 mg/dL (ref 70–179)
POTASSIUM: 4.3 mmol/L (ref 3.4–4.8)
PROTEIN TOTAL: 6.6 g/dL (ref 5.7–8.2)
SODIUM: 134 mmol/L — ABNORMAL LOW (ref 135–145)

## 2020-07-06 LAB — LIPASE: LIPASE: 42 U/L (ref 12–53)

## 2020-07-06 LAB — PHOSPHORUS: PHOSPHORUS: 2.8 mg/dL (ref 2.4–5.1)

## 2020-07-06 LAB — MAGNESIUM: MAGNESIUM: 1.5 mg/dL — ABNORMAL LOW (ref 1.6–2.6)

## 2020-07-06 LAB — LACTATE SEPSIS, VENOUS: LACTATE BLOOD VENOUS: 2 mmol/L — ABNORMAL HIGH (ref 0.5–1.8)

## 2020-07-06 NOTE — Unmapped (Signed)
TC to pt who reports diarrhea x 3 days, which she treated with one dose of Imotil today. Pt stopped taking her daily dose of Miralax once the diarrhea started. Pt reports that she has not eaten in 2 days, but was attempting to eat a sandwich when I called. Pt reports that she is aching all over, but has not taken pain medication out of fear of vomiting. Pt reports intermittent nausea. Pt also reports feeling woozy, and is having difficulty remembering things, since yesterday. Pt expresses I just feel terrible, and I'm so sick. I consulted with Dr. Duard Brady and advised pt to go to the ED to be evaluated. Pt is reluctant but was advised this is the best course of action to start a thorough evaluation. Pt ultimately verbalized understanding and confirmed that she has someone to drive her to the ED.

## 2020-07-07 LAB — CA 125: CA 125: 100 U/mL — ABNORMAL HIGH (ref 0–35)

## 2020-07-07 LAB — BASIC METABOLIC PANEL
ANION GAP: 13 mmol/L (ref 5–14)
BLOOD UREA NITROGEN: 27 mg/dL — ABNORMAL HIGH (ref 9–23)
BUN / CREAT RATIO: 19
CALCIUM: 8.4 mg/dL — ABNORMAL LOW (ref 8.7–10.4)
CHLORIDE: 101 mmol/L (ref 98–107)
CO2: 18 mmol/L — ABNORMAL LOW (ref 20.0–31.0)
CREATININE: 1.43 mg/dL — ABNORMAL HIGH
EGFR CKD-EPI AA FEMALE: 42 mL/min/{1.73_m2} — ABNORMAL LOW (ref >=60)
EGFR CKD-EPI NON-AA FEMALE: 36 mL/min/{1.73_m2} — ABNORMAL LOW (ref >=60)
GLUCOSE RANDOM: 115 mg/dL (ref 70–179)
POTASSIUM: 3.8 mmol/L (ref 3.4–4.8)
SODIUM: 132 mmol/L — ABNORMAL LOW (ref 135–145)

## 2020-07-07 LAB — MAGNESIUM: MAGNESIUM: 1.7 mg/dL (ref 1.6–2.6)

## 2020-07-07 LAB — PREALBUMIN: PREALBUMIN: 9.4 mg/dL — ABNORMAL LOW (ref 10.0–40.0)

## 2020-07-07 LAB — PHOSPHORUS: PHOSPHORUS: 2.7 mg/dL (ref 2.4–5.1)

## 2020-07-07 MED ADMIN — oxyCODONE (ROXICODONE) immediate release tablet 5 mg: 5 mg | ORAL | @ 09:00:00 | Stop: 2020-07-07

## 2020-07-07 MED ADMIN — iohexoL (OMNIPAQUE) 350 mg iodine/mL solution 100 mL: 100 mL | INTRAVENOUS | @ 02:00:00 | Stop: 2020-07-06

## 2020-07-07 MED ADMIN — magnesium sulfate in water 2 gram/50 mL (4 %) IVPB 2 g: 2 g | INTRAVENOUS | @ 03:00:00

## 2020-07-07 MED ADMIN — megestrol (MEGACE) oral suspension: 800 mg | ORAL | @ 13:00:00 | Stop: 2020-07-07

## 2020-07-07 MED ADMIN — diphenhydrAMINE (BENADRYL) capsule/tablet 25 mg: 25 mg | ORAL | @ 09:00:00 | Stop: 2020-07-07

## 2020-07-07 MED ADMIN — ondansetron (ZOFRAN) injection 4 mg: 4 mg | INTRAVENOUS | @ 04:00:00 | Stop: 2020-07-06

## 2020-07-07 MED ADMIN — ondansetron (ZOFRAN) injection 4 mg: 4 mg | INTRAVENOUS | @ 01:00:00 | Stop: 2020-07-06

## 2020-07-07 MED ADMIN — venlafaxine (EFFEXOR-XR) 24 hr capsule 300 mg: 300 mg | ORAL | @ 10:00:00

## 2020-07-07 MED ADMIN — lisinopriL (PRINIVIL,ZESTRIL) tablet 20 mg: 20 mg | ORAL | @ 13:00:00 | Stop: 2020-07-07

## 2020-07-07 MED ADMIN — ARIPiprazole (ABILIFY) tablet 10 mg: 10 mg | ORAL | @ 13:00:00

## 2020-07-07 MED ADMIN — sodium chloride 0.9% (NS) bolus 1,000 mL: 1000 mL | INTRAVENOUS | @ 01:00:00 | Stop: 2020-07-06

## 2020-07-07 MED ADMIN — heparin, porcine (PF) 100 unit/mL injection 300 Units: 300 [IU] | INTRAVENOUS | @ 05:00:00 | Stop: 2020-07-07

## 2020-07-07 MED ADMIN — MORPhine 4 mg/mL injection 4 mg: 4 mg | INTRAVENOUS | @ 01:00:00 | Stop: 2020-07-06

## 2020-07-07 MED ADMIN — methylPREDNISolone sodium succinate (PF) (Solu-MEDROL) injection 53.75 mg: 1 mg/kg/d | INTRAVENOUS | @ 09:00:00

## 2020-07-07 NOTE — Unmapped (Signed)
GYN ONC History and Physical    ASSESSMENT & PLAN     Lisa Burton is a 75 y.o. 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    Active Problems:    Major depressive disorder    Borderline personality disorder (CMS-HCC)    Fallopian tube cancer, carcinoma (CMS-HCC)    Encounter for antineoplastic chemotherapy       Onc: Dr. 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   - 07/18/17-11/17/17: carboplatin/paclitaxel   - 08/25/18: recurrent disease    - 09/04/18-06/07/19: doxil/carboplatin   - 08/20/19-03/13/20: niraparib   - 04/20/20-05/26/20: olaparib   - 05/15/20: CT with innumerable hypoattenuating lesions throughout liver, chronic fracture deformity of proximal L superior pubic ramus, enlarging/new peritoneal implants abutting small bowel mesentery measuring 1.3 and 0.9cm   - 06/12/20: cyclophosphamide/pembrolizumab/bevacizumab   - 07/06/20: CT c/a/p with 5.9 x 5.3 x 4.8cm heterogenously enhancing pelvic mass, slightly hypoattenuating peripherally enhancing 3.0 x 2.8 x 2.9cm mass abutting vaginal cuff.  Moderate L hydroureteronephrosis (similar to prior). Lower abdominal omental implants, enlarged from prior. L pelvic side wall LN + L external iliac node, both enlarged. Innumerable hepatic lesions with central necrosis, increased in size. Extrinsic compression of hepatic IVC + hepatic veins, intrahepatic IVC significantly stenotic c/f possible infiltration. Trace ascites   - Last Ca-125 (06/05/20): 69 (up from 19 in 03/2020), repeat pending    Diarrhea, abdominal pain  C/f pembro colitis   - 3/21 Last chemo, worsening symptoms on 3/22. Has had intermittent diarrhea over the last few weeks.   - Afebrile, WBC 6.4, Lactate 2.0   - Treat for presumed pembro colitis with methylprednisone 1mg /kg/day qd  - BCx pending  - UA with many bac > UCx pending   - GI pathogen panel ordered  - Resp panel neg    FTT  Inability to tolerate PO d/t decreased appetite. Unclear recall of specific medications she had been taking  - Cont home meds: Zofran, Reglan, omeprazole, megace,   - Albumin, prealbumin pending  - Consider nutrition c/s, ensure supplementation    Confusion    - Increasing difficulty with word finding. P  - CTH negative    Cancer related pain   - Home meds: Tylenol 1000mg  TID   - Tylenol, oxy, IV morphine PRN    CKD - Hypomagnesemia   - Baseline range from 1.5-1.7 since 04/2020   - Admit Cr 1.57; avoid nephrotoxins   - Mag 1.5 > repleted   - Replete lytes PRN     Anemia    - Baseline 8-9    - Admit Hgb 10     OAB    - Oxybutynin 5mg  QID > self discontinued given her decreased PO was hesitant take    Osteoporosis    - Hx chronic pubic ramus fracture    - ALP elevated     HTN - HLD    - Lisinopril 20mg  daily   - Atorvastatin 10mg  at bedtime     ADHD/MDD/Borderline   - Abilify 10mg  daily   - Effexor 300mg  daily    - Trazodone 100mg  at bedtime     Prophylaxis: Lovenox, SCDs    Code Status: full code, confirmed on admission.     Disposition: floor  Plan discussed with Dr. Fredricka Bonine. Attending, Dr. Nelly Rout immediately available.     HPI     Chief Complaint:   Chief Complaint   Patient presents with    Diarrhea       Lisa Burton is a 75 y.o.  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     The patient reports intermittent symptoms present for weeks, however acutely worsened on 3/22. She had her last chemo 3/21 and has tolerated this well in the past. On Tuesday she began to have increased loose stools approx 3x/day with worsening abdominal pain. Denies any blood in stool. No fevers, chills, sick contacts. This was accompanied by some intermittent nausea, controlled with po home meds (zofran reglan). States her appetite has been minimal over the last few weeks. She is passing flatus. With the encouragement of her husband she was able to tolerate half of a sandwich 3/24 in the afternoon. She has also had significant increase in fatigue this week. She spends most of her time in bed at baseline, but was so fatigued that she would often ask her husband to accompany her to the bathroom.    Of note, the patient does report increasing confusion, inability to find words and remember details of her day. When asked various questions about the specifics of her symptoms she had difficulty commenting on details and would have variation in her answers.     Oncology History:  Oncology History   Fallopian tube cancer, carcinoma (CMS-HCC)   06/17/2017 Initial Diagnosis    CT:  Findings compatible with ovarian carcinoma with bilateral suspicious appearing ovarian lesions, ascites, and peritoneal thickening, predominantly within the posterior cul-de-sac, concerning for metastatic spread.      06/17/17: CA 125 129     07/01/2017 Surgery    Procedure on 07/01/17: Diagnostic laparoscopy, conversion to exploratory laparotomy, tumor debulking with total hysterectomy, bilateral salpingo-oophorectomy, infragastric omentectomy, Argon beam ablation of tumor nodules, enterolysis    Operative Findings: On diagnostic laparoscopy, Fagotti score 6 (Diaphragmatic disease, Omental disease, Liver disease) though disease appeared small volume in the upper abdomen and primarily pelvic.  Ascites present. Optimal debulking was felt feasible. After laparotomy, 6x4cm plaque in the portahepatis noted which was not debulkable (argon laser coagulated). We noted a scant miliary disease along diaphragm, peritoneum, small bowel mesentery (residual). Ovaries densely adherent to multiple adherent redundant loops of rectosigmoid colon (no evidence of obstruction), and posterior uterus. Thin plaque of tumor along rectosigmoid colon and 3x3cm nodule at base of rectosigmoid mesentery(residual). Tumor nodules along the bladder reflection (resected).  No other disease noted on full abdominopelvic survey. Optimal debulking would have required debulking the porta hepatis, rectosigmoid resection, peritoneal stripping in many locations. 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 Consideration of bevacizumab versus enrollment in ATHENA trial as maintenance. Referral to genetics. BRCA immunostaining pending        07/18/2017 - 11/17/2017 Chemotherapy    Chemotherapy Treatment    Treatment Goal Curative   Line of Treatment First Line   Plan Name OP OVARIAN PACLITAXEL/CARBOPLATIN   Start Date 07/21/2017   End Date 11/17/2017   Provider Ermelinda Das, MD   Chemotherapy dexamethasone (DECADRON) tablet 12 mg, 12 mg, Oral, Once, 6 of 6  cycles  Administration: 12 mg (07/21/2017), 12 mg (08/11/2017), 12 mg (09/05/2017), 12 mg (09/29/2017), 12 mg (10/28/2017), 12 mg (11/17/2017)  CARBOplatin (PARAPLATIN) 580.8 mg in sodium chloride (NS) 0.9 % 100 mL IVPB, 580.8 mg (114.2 % of original dose 508.8 mg), Intravenous, Once, 6 of 6 cycles  Dose modification:   (original dose 508.8 mg, Cycle 1)  Administration: 580.8 mg (07/21/2017), 528.6 mg (08/11/2017), 517.8 mg (09/05/2017), 529.8 mg (09/29/2017), 493.2 mg (10/28/2017), 498 mg (11/17/2017)  PACLItaxel (TAXOL) 286.98 mg in sodium chloride 0.9% NON-PVC (NS) 0.9 % 500 mL IVPB, 175 mg/m2 = 286.98 mg, Intravenous, Once, 6 of 6 cycles  Administration: 286.98 mg (07/21/2017), 285.24 mg (08/11/2017), 276.48 mg (09/05/2017), 278.28 mg (09/29/2017), 281.76 mg (10/28/2017), 283.5 mg (11/17/2017)          01/16/2018 Genetics    [facility] panel. Genetic testing is negative.     08/25/2018 Recurrence    Elevated CA-125 and imaging consistent with recurrent disease.       09/04/2018 - 06/07/2019 Chemotherapy    OP OVARIAN DOXORUBICIN LIPOSOMAL/CARBOPLATIN  DOXOrubicin Liposome 30 mg/m2 IV on day 1, CARBOplatin AUC 5 IV on day 1, every 28 days       08/20/2019 - 03/13/2020 Chemotherapy    niraparib     04/20/2020 - 05/26/2020 Chemotherapy    olaparib     05/15/2020 Recurrence    + ct in ER with liver and peritoneal metastasis. We were not aware of the scan until 06/05/20.       06/12/2020 -  Chemotherapy    OP CERVICAL PEMBROLIZUMAB  pembrolizumab 200 mg IV on day 1, every 21 days         Obstetric History: G1P1001    Past Medical History:  Past Medical History:   Diagnosis Date    ADHD (attention deficit hyperactivity disorder)     Anxiety     At risk for falls     Baker's cyst     Fractures     Hypersomnia     Hypertension     Injury of posterior cruciate ligament     Insomnia     Irritable colon 07/10/2010    Joint pain     Joint replaced     Major depressive disorder     OSA (obstructive sleep apnea)     Osteoarthritis     Osteoporosis     Ovarian cancer (CMS-HCC) 07/03/2017    Personality disorder (CMS-HCC)     Borderline Personality Disorder    PLMD (periodic limb movement disorder)     Tear of meniscus of knee     Urge incontinence 11/12/2012    Vestibular dysfunction 09/16/2013       Past Surgical History:  Past Surgical History:   Procedure Laterality Date    CESAREAN SECTION  1981    FINGER FRACTURE SURGERY Right 10/2013    thumb    FOOT SURGERY Bilateral 9/00, 01/14    hardware    IR INSERT PORT AGE GREATER THAN 5 YRS  07/04/2017    IR INSERT PORT AGE GREATER THAN 5 YRS 07/04/2017 [name], MD IMG [facility]    PR BIL SALP-OOPH W/OMENTECT,TAH,RAD DISSECT Bilateral 07/01/2017    Procedure: BILATERAL SALPINGO-OOPHORECTOMY W/OMENTECTOMY, TOTAL ABD HYSTERECTOMY & RADICAL DISSECTION FOR DEBULKING;  Surgeon: Ermelinda Das, MD;  Location: MAIN OR [facility];  Service: Gynecology Oncology    PR LAP,DIAGNOSTIC ABDOMEN N/A 07/01/2017    Procedure: Laparoscopy, Abdomen, Peritoneum, & Omentum, Diagnostic, W/Wo Collection Specimen(S) By  Brushing Or Washing;  Surgeon: Ermelinda Das, MD;  Location: MAIN OR [facility];  Service: Gynecology Oncology    REPLACEMENT TOTAL KNEE Bilateral 08/27/2004    TONSILLECTOMY  1970    WRIST FRACTURE SURGERY Right 1998    wrist       Allergies:  Codeine and Sulfa (sulfonamide antibiotics)    Medications:  Current Facility-Administered Medications   Medication Dose Route Frequency Provider Last Rate Last Admin    acetaminophen (TYLENOL) tablet 650 mg  650 mg Oral Q4H PRN Lance Muss, MD        ARIPiprazole (ABILIFY) tablet 10 mg  10 mg Oral Daily Lance Muss, MD        enoxaparin (LOVENOX) syringe 40 mg  40 mg Subcutaneous Q24H Lance Muss, MD        MORPhine 4 mg/mL injection 4 mg  4 mg Intravenous Q4H PRN Lance Muss, MD        oxyCODONE (ROXICODONE) immediate release tablet 5 mg  5 mg Oral Q4H PRN Lance Muss, MD   5 mg at 07/07/20 0454    Or    oxyCODONE (ROXICODONE) immediate release tablet 10 mg  10 mg Oral Q4H PRN Lance Muss, MD        venlafaxine (EFFEXOR-XR) 24 hr capsule 300 mg  300 mg Oral QAM Lance Muss, MD           Past Social History:  Social History     Tobacco Use    Smoking status: Never Smoker    Smokeless tobacco: Never Used   Vaping Use    Vaping Use: Never used   Substance Use Topics    Alcohol use: Yes     Alcohol/week: 1.0 - 2.0 standard drink     Types: 1 - 2 Shots of liquor per week    Drug use: No        Family History:  family history includes Arthritis in her father, mother, and sister; Cancer in her father; Depression in her son; Personality disorder in her sister.    Immunizations:   Immunization History   Administered Date(s) Administered    COVID-19 VACC,MRNA,(PFIZER)(PF)(IM) 05/18/2019, 07/06/2019, 12/16/2019    INFLUENZA INJ MDCK PF, QUAD,(FLUCELVAX)(53MO AND UP EGG FREE) 12/31/2019    INFLUENZA TIV (TRI) PF (IM) 01/29/2011, 01/27/2012    Influenza Vaccine Quad (IIV4 PF) 31mo+ injectable 03/10/2016, 01/20/2018, 12/17/2018    Influenza Virus Vaccine, unspecified formulation 01/07/2017    Influenza, High Dose (IIV4) 65 yrs & older 02/12/2013, 01/27/2014, 03/28/2015    PNEUMOCOCCAL POLYSACCHARIDE 23 11/12/2012    Pneumococcal Conjugate 13-Valent 02/12/2013    TdaP 07/26/2012, 12/22/2019       Review of Systems:  A comprehensive review of 14 systems was negative except for pertinent positives noted in HPI.    PHYSICAL EXAM     Temp:  [36.4 ??C-36.7 ??C] 36.7 ??C  Heart Rate:  [74-94] 74  Resp:  [16-18] 18  BP: (133-169)/(70-82) 169/78  SpO2:  [97 %-100 %] 100 %    -General:   Chronically ill appearing, underweight but non toxic appearing.  -CV:    Regular rate  -Pulm:   Normal WOB.  -Abd:    Soft, mild tenderness throughout, predominantly in bilateral flanks. Non distended. No palpable masses. Voluntary guarding, no rebound.   -Extremities:   No clubbing, erythema, or edema.   -Psych:   Appropriate.The patient is awake and alert.  -GU:    Deferred    LABS AND IMAGING  All lab results last 24 hours:    Recent Results (from the past 24 hour(s))   RAPID INFLUENZA/RSV/COVID PCR    Collection Time: 07/06/20  5:19 PM    Specimen: Nasopharyngeal Swab   Result Value Ref Range    SARS-CoV-2 PCR Negative Negative    Influenza A Negative Negative    Influenza B Negative Negative    RSV Negative Negative   Comprehensive Metabolic Panel    Collection Time: 07/06/20  8:41 PM   Result Value Ref Range    Sodium 134 (L) 135 - 145 mmol/L    Potassium 4.3 3.4 - 4.8 mmol/L    Chloride 104 98 - 107 mmol/L    Anion Gap 11 5 - 14 mmol/L    CO2 18.8 (L) 20.0 - 31.0 mmol/L    BUN 35 (H) 9 - 23 mg/dL    Creatinine 1.61 (H) 0.60 - 0.80 mg/dL    BUN/Creatinine Ratio 22     EGFR CKD-EPI Non-African American, Female 32 (L) >=60 mL/min/1.61m2    EGFR CKD-EPI African American, Female 37 (L) >=60 mL/min/1.65m2    Glucose 99 70 - 179 mg/dL    Calcium 9.1 8.7 - 09.6 mg/dL    Albumin 3.3 (L) 3.4 - 5.0 g/dL    Total Protein 6.6 5.7 - 8.2 g/dL    Total Bilirubin 0.6 0.3 - 1.2 mg/dL    AST 47 (H) <=04 U/L    ALT 31 10 - 49 U/L    Alkaline Phosphatase 341 (H) 46 - 116 U/L   Magnesium Level    Collection Time: 07/06/20  8:41 PM   Result Value Ref Range    Magnesium 1.5 (L) 1.6 - 2.6 mg/dL   Phosphorus Level    Collection Time: 07/06/20  8:41 PM   Result Value Ref Range    Phosphorus 2.8 2.4 - 5.1 mg/dL   Lipase Level    Collection Time: 07/06/20  8:41 PM   Result Value Ref Range    Lipase 42 12 - 53 U/L   Lactate Sepsis, Venous    Collection Time: 07/06/20  8:41 PM   Result Value Ref Range    Lactate, Venous 2.0 (H) 0.5 - 1.8 mmol/L   CBC w/ Differential    Collection Time: 07/06/20  8:41 PM   Result Value Ref Range    WBC 6.4 3.6 - 11.2 10*9/L    RBC 2.72 (L) 3.95 - 5.13 10*12/L    HGB 10.0 (L) 11.3 - 14.9 g/dL    HCT 54.0 (L) 98.1 - 44.0 %    MCV 108.2 (H) 77.6 - 95.7 fL    MCH 36.6 (H) 25.9 - 32.4 pg    MCHC 33.9 32.0 - 36.0 g/dL    RDW 19.1 (H) 47.8 - 15.2 %    MPV 8.1 6.8 - 10.7 fL    Platelet 157 150 - 450 10*9/L    nRBC 0 <=4 /100 WBCs    Neutrophils % 68.6 %    Lymphocytes % 11.5 %    Monocytes % 19.0 %    Eosinophils % 0.4 %    Basophils % 0.5 %    Absolute Neutrophils 4.4 1.8 - 7.8 10*9/L    Absolute Lymphocytes 0.7 (L) 1.1 - 3.6 10*9/L    Absolute Monocytes 1.2 (H) 0.3 - 0.8 10*9/L    Absolute Eosinophils 0.0 0.0 - 0.5 10*9/L    Absolute Basophils 0.0 0.0 - 0.1 10*9/L    Macrocytosis Slight (A) Not Present  Urinalysis with Culture Reflex    Collection Time: 07/06/20  9:32 PM    Specimen: Clean Catch; Urine   Result Value Ref Range    Color, UA Yellow     Clarity, UA Clear     Specific Gravity, UA 1.025 1.005 - 1.040    pH, UA 5.0 5.0 - 9.0    Leukocyte Esterase, UA Negative Negative    Nitrite, UA Negative Negative    Protein, UA 30 mg/dL (A) Negative    Glucose, UA Negative Negative    Ketones, UA Negative Negative    Urobilinogen, UA 1.0 mg/dL     Bilirubin, UA Negative Negative    Blood, UA Negative Negative    RBC, UA <1 <4 /HPF    WBC, UA <1 0 - 5 /HPF Squam Epithel, UA 2 0 - 5 /HPF    Bacteria, UA Many (A) None Seen /HPF     XR Chest Portable    Result Date: 07/06/2020  EXAM: XR CHEST PORTABLE DATE: 07/06/2020 8:27 PM ACCESSION: [id number] UN DICTATED: 07/06/2020 8:28 PM INTERPRETATION LOCATION: [facility] CLINICAL INDICATION: 75 years old Female with DYSPNEA  COMPARISON: Chest radiographs 05/15/2020. TECHNIQUE: Portable Chest Radiograph. FINDINGS: Right internal jugular venous port in unchanged position with tip overlying the mid to lower superior vena cava. No focal consolidation or venous congestion. No pleural effusion or pneumothorax. Stable cardiomediastinal silhouette. Chronic healed bilateral rib fractures. Previously described sternal deformity and fracture with expansile intramedullary lytic component, better visualized on most recent CT chest.     No acute cardiopulmonary abnormality.    CT Head Wo Contrast    Result Date: 07/06/2020  EXAM: Computed tomography, head or brain without contrast material. DATE: 07/06/2020 10:05 PM ACCESSION: [id number] UN DICTATED: 07/06/2020 10:09 PM INTERPRETATION LOCATION: [facility] CLINICAL INDICATION: 75 years old Female with Confusion, known gyn cancer  COMPARISON: 10/24/2019 and 06/14/2019 head CT TECHNIQUE: Axial CT images of the head  from skull base to vertex without contrast. FINDINGS: Moderate global cerebral atrophy, similar to prior. There are scattered and confluent hypodense foci within the periventricular and deep white matter, which are nonspecific but commonly associated with small vessel ischemic changes. There is no midline shift. No mass lesion. There is no evidence of acute infarct. No acute intracranial hemorrhage. No fractures are evident. The sinuses are pneumatized. Bilateral aphakia from cataract surgery.     No acute intracranial abnormality.    CT Abdomen Pelvis with IV Contrast ONLY    Result Date: 07/06/2020  EXAM: CT ABDOMEN PELVIS W CONTRAST DATE: 07/06/2020 10:05 PM ACCESSION: [id number] UN DICTATED: 07/06/2020 10:16 PM INTERPRETATION LOCATION: [facility] CLINICAL INDICATION: Known ovarian cancer, abdominal pain, diarrhea  COMPARISON: Same day chest CT. CT head dated 05/14/2020 TECHNIQUE: A spiral CT scan of the abdomen and pelvis was obtained with IV contrast from the lung bases through the pubic symphysis. Images were reconstructed in the axial plane. Coronal and sagittal reformatted images were also provided for further evaluation. FINDINGS: LINES AND TUBES: None. LOWER THORAX: Refer to same-day CT chest for evaluation above the diaphragm. HEPATOBILIARY: Innumerable-hypoattenuating hepatic lesions which demonstrate interval increase in size/new from prior examination. These  lesions demonstrate significant extrinsic compression on the intrahepatic IVC and hepatic veins. The intrahepatic IVC is significantly stenotic with nonvisualization of a short segment , and infiltration is difficult to exclude. In addition there is significant extrinsic compression on the right hepatic vein secondary to the large metastatic lesions (2:19). The left and middle hepatic veins are also diminutive in caliber.  Reference the below lesions: - 4.4 x 4.2 cm segment 3 (2:29), previously 3.5 x 2.4 cm -6.3 x 5.2 cm segment 7 (2:19), previously 4.9 x 4.3 cm Many of the larger lesions demonstrate central necrosis with extracapsular extension with thickening of the hepatic capsule. There are several small lesions which appear new from prior exam The gallbladder is mildly hydropic. No biliary dilatation. Unchanged prominence of the cystic duct SPLEEN: Unremarkable. PANCREAS: Unremarkable. ADRENALS: Unremarkable. KIDNEYS/URETERS: Delayed left nephrogram. Moderate left hydroureteronephrosis, unchanged. Patchy enhancement of the right kidney. Wall thickening with enhancement of the distal left ureter (2:100) BLADDER: Circumferential bladder wall thickening PELVIC/REPRODUCTIVE ORGANS: Status post hysterectomy and bilateral salpingo-oophorectomies. There is a large heterogeneously enhancing pelvic mass which measures 5.9 x 5.3 x 4.8 cm (2:90, 4:41,  ap x tv x cc) and is more conspicuous in the presence of contrast when compared to prior exam. Inferior to this mass is slightly hypoattenuating peripherally enhancing encasing hyperattenuating material, likely postsurgical, and measuring approximately 3.0 x 2.8 x 2.9 cm (2:93, 4:48 ap x tv x cc). This mass abuts the vaginal cuff which demonstrates enhancement at the superior aspect. In addition there is conglomerate pelvic mass exerts mass effect on the distal left ureter leading to upstream dilatation. It is difficult to determine if this mass arises from the adjacent sigmoid colon given obliteration of fat planes (2:85) GI TRACT: Small sliding hiatal hernia with thickening of the distal esophagus, likely related to reflux esophagitis. No dilated loops of bowel. Normal appendix. Mild thickening of the rectosigmoid colon PERITONEUM/RETROPERITONEUM AND MESENTERY: Trace pelvic fluid with thickening along the presacral space. Reference the following implant -Lower abdominal omental implants measures 1.9 x 2.1 cm (2:78), previously 1.5 x 1.4 cm LYMPH NODES: -Left pelvic sidewall lymph node measuring 2.5 x 1.9 cm (2:93), previously 1.2 x 2.7 cm. -Left external iliac node measures 1.4 x 1.2 cm (2:85), previously subcentimeter VESSELS: The aorta is normal in caliber.  No significant calcified atherosclerotic disease. The portal venous system is patent. However there is slightly diminutive appearance of the right portal vein. See the  HEPATOBILIARY section for evaluation of the hepatic veins and IVC BONES AND SOFT TISSUES: Multilevel degenerative disc disease and facet arthropathy with grade 1 L2 on L3 retrolisthesis, grade 1 L3 on L4 anterolisthesis and grade 1 L4 on L5 retrolisthesis. Thoracolumbar levocurvature Chronic fracture deformity of the left superior pubic ramus.     1.Status post hysterectomy and bilateral salpingo-oophorectomies. Conglomerate heterogeneously enhancing pelvic mass which is in close proximity to surgical bed and abutting the vaginal cuff. It is difficult to determine if this mass arises from the adjacent sigmoid colon given obliteration of fat planes. Differential includes local recurrence vs new primary sigmoid malignancy. Wall thickening of the rectosigmoid colon is likely reactive or may represent mild colitis 2.Innumerable hypoattenuating bilobar hepatic lesions, some of  which demonstrate central necrosis and are  increased in size from prior with several lesions concerning for worsening metastasis. 3.Extrinsic compression of the above masses on the hepatic IVC and hepatic veins which are diminutive. The intrahepatic IVC is significantly stenotic with nonvisualization of a short segment , and infiltration is difficult to exclude. The right portal vein is also diminutive 4.Trace ascites, new from prior. Interval enlargement of pelvic and pelvic implants concerning for worsening carcinomatosis. 5.Delayed left nephrogram likely related to obstructive uropathy secondary to obstruction from the pelvic mass with. moderate left hydroureteronephrosis. The hydroureteronephrosis is similar to prior study but was not well characterized without contrast. 6.Somewhat patchy  enhancement of the right kidney, now bladder wall thickening and urothelial enhancement of the distal left ureter. This is likely reactive given bland urinalysis, less likely ascending infection or UTI/pyelonephritis 7.Additional incidental findings as described above 8.Refer to same-day CT chest for evaluation above the diaphragm. The findings of this study were discussed via telephone with DR. ANDREW PETRILLI by Dr. Garth Bigness on 07/06/2020 11:08 PM.    CTA Chest W Contrast    Result Date: 07/06/2020  EXAM: CTA CHEST W CONTRAST DATE: 07/06/2020 10:05 PM ACCESSION: [id number] UN DICTATED: 07/06/2020 10:13 PM INTERPRETATION LOCATION: [facility] CLINICAL INDICATION: 75 years old Female with Cancer, pleuritic chest pain, eval for PE ; PE suspected, high prob  COMPARISON: Same day chest radiograph. CT chest dated 07/09/2019 and prior. TECHNIQUE: A helical CTA scan was obtained with IV contrast from the lung apices to the lung bases. Images were reconstructed in the axial plane. Multiplanar reformatted and MIP images were provided. FINDINGS: PULMONARY ARTERIES: No pulmonary embolism. AIRWAYS, LUNGS, PLEURA: Clear central tracheobronchial tree.  No lung consolidation. Trace bibasilar linear atelectasis. Calcified granuloma in the left lower lobe. No pleural effusion. MEDIASTINUM: Normal heart size. No pericardial effusion.  Normal caliber thoracic aorta.  No mediastinal lymphadenopathy. Right chest port with catheter tip in the right atrium. IMAGED ABDOMEN: Please see concurrent CT abdomen pelvis report. SOFT TISSUES: Unremarkable. BONES: Mild S-shaped curvature of the thoracolumbar spine. Mild multilevel degenerative change of the spine. No suspicious osseous lesions. Grossly unchanged appearance of an upper sternal cortical deformity with intramedullary stellate lucency (8:75), stable since 07/09/2019. No acute osseous abnormality. Old left-sided lateral rib fractures.     No pulmonary embolism. No acute airspace disease. Additional chronic/incidental findings as above.    Radiology studies were personally reviewed    I saw and evaluated Ms. Amie Critchley , participating in the key portions of the service with the resident and fellow.  I reviewed the resident???s note and agree with the findings, assessment and plan as outlined in this note.    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

## 2020-07-07 NOTE — Unmapped (Addendum)
Lisa Burton is a 75 y.o. 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 Duard Brady. She was diagnosed on 07/01/17, where diagnostic laparoscopy was converted to an exploratory laparotomy, tumor debulking with total abdominal hysterectomy, bilateral salpingo-oophorectomy, infragastric omentectomy, Argon beam ablation of tumor nodules, and enterolysis. At time of this admission, she had already undergone 3 cycles of chemo. Her CT on 05/15/20 showed innumerable hypoattenuating lesions throughout her liver, chronic fracture deformity of proximal left superior pubic ramus, and enlarging/new peritoneal implants abutting small bowel mesentery measuring 1.3 and 0.9cm. Next CT abdomen/pelvis on 07/06/20 showed 5.9 x 5.3 x 4.8cm heterogenously enhancing pelvic mass, slightly hypoattenuating peripherally enhancing 3.0 x 2.8 x 2.9cm mass abutting vaginal cuff;  Moderate left hydroureteronephrosis (similar to prior); Lower abdominal omental implants, enlarged from prior; left pelvic side wall lymph nodes + left external iliac node, both enlarged; Innumerable hepatic lesions with central necrosis, increased in size; Extrinsic compression of hepatic IVC + hepatic veins, intrahepatic IVC significantly stenotic c/f possible infiltration; Trace ascites. Ca-125 on 06/05/20 was 69 (up from 19 in 03/2020), repeat on 3/25 was 100.    Diarrhea, abdominal pain (resolved), concern for pembro colitis   At time of admission, her last chemo session was on 07/03/20. Her symptoms began worsening on 07/04/20, with intermittent diarrhea over a few weeks. She was afebrile on admission with a WBC of 6.4 and Lactate 2.0. Blood cultures were negative to date. Her urinalysis had many bacteria, urine culture showed ***. Respiratory panel was negative and GI pathogen panel showed ***.    Failure to Thrive  She was unable to tolerate oral diet due to decreased appetite. Pharmacy was consulted to clarify her home medications, and she was continued on Zofran while inpatient.    Confusion, resolved  Patient with increased difficulty with word finding on admission. CTH was negative.    Cancer Related Pain  Her pain was controlled on a regimen of Tylenol 650 q4h prn, oxy 5-10mg  q4h prn set by Pharmacy and Palliative Care.    Chronic Kidney Disease - Hypomagnesemia  Creatinine on admission was 1.57 (baseline 1.5-1.7). Her electrolytes were repleted as needed.    Anemia  Patient with baseline Hgb 8.0-9.0. Hgb on admission was 10.0.    Osteoporosis    Has a history of chronic pubic ramus fracture with an elevated ALP.    Hypertension - Hyperlipidemia  Her home Lisinopril was held given she had not been taking for 2 months.     ADHD/MDD/Borderline   She was continued on her home Abilify 10mg  daily, Effexor 300mg  daily, while her Trazodone 100mg  at bedtime was switched to Remeron 7.5mg  qHS per Palliative Care.    On HD#3/(07/08/2020), patient was deemed stable for discharge from a Gyn Onc standpoint. She was scheduled to follow-up at *** on ***.

## 2020-07-07 NOTE — Unmapped (Signed)
GYN Daily Progress Note    ASSESSMENT AND PLAN     Lisa Burton is a 75 y.o. 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    Onc: Dr. 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   - 07/18/17-11/17/17: carboplatin/paclitaxel   - 08/25/18: recurrent disease    - 09/04/18-06/07/19: doxil/carboplatin   - 08/20/19-03/13/20: niraparib   - 04/20/20-05/26/20: olaparib   - 05/15/20: CT with innumerable hypoattenuating lesions throughout liver, chronic fracture deformity of proximal L superior pubic ramus, enlarging/new peritoneal implants abutting small bowel mesentery measuring 1.3 and 0.9cm   - 06/12/20: cyclophosphamide/pembrolizumab/bevacizumab   - 07/06/20: CT c/a/p with 5.9 x 5.3 x 4.8cm heterogenously enhancing pelvic mass, slightly hypoattenuating peripherally enhancing 3.0 x 2.8 x 2.9cm mass abutting vaginal cuff.  Moderate L hydroureteronephrosis (similar to prior). Lower abdominal omental implants, enlarged from prior. L pelvic side wall LN + L external iliac node, both enlarged. Innumerable hepatic lesions with central necrosis, increased in size. Extrinsic compression of hepatic IVC + hepatic veins, intrahepatic IVC significantly stenotic c/f possible infiltration. Trace ascites   - Last Ca-125 (06/05/20): 69 (up from 19 in 03/2020), repeat 100  - palliative c/s: confirmed transition to DNR/DNI status, however interested in pursuing further chemotherapy after speaking with Dr. Duard Brady 3/25. Will f/u in clinic to decide next options.      Diarrhea, abdominal pain (resolving)  C/f pembro colitis   - 3/21 Last chemo, worsening symptoms on 3/22. Has had intermittent diarrhea over the last few weeks  - Afebrile, WBC 6.4, Lactate 2.0   - Treat for presumed pembro colitis with methylprednisone 1mg /kg/day every day   > however since admission no diarrhea, low suspicion at this time, will continue steroids for now and reassess in the AM  - BCx pending  - UA with many bac > UCx pending   - GI pathogen panel ordered  - Resp panel neg     FTT  Inability to tolerate PO d/t decreased appetite. Unclear recall of specific medications she had been taking  - s/p pharmacy c/s for clarification on meds at home  - Cont home meds: prn Zofran   > pt has not been taking megace at home and tolerating PO well, will not continue inpatient  - Albumin >3  - Consider nutrition c/s, ensure supplementation     Confusion    - Increasing difficulty with word finding.   - CTH negative    Cancer related pain   - Home meds: Tylenol 1000mg  TID  - per pharm and palliative, will change to tylenol 650 q4h prn, oxy 5-10mg  q4h prn       CKD - Hypomagnesemia   - Baseline range from 1.5-1.7 since 04/2020   - Admit Cr 1.57; avoid nephrotoxins   - Mag 1.5 > repleted   - Replete lytes PRN      Anemia    - Baseline 8-9    - Admit Hgb 10      Osteoporosis    - Hx chronic pubic ramus fracture    - ALP elevated      HTN - HLD    - home meds (lisinopril) held - pt has not been taking for 2  months, per pharm recs     ADHD/MDD/Borderline   - Abilify 10mg  daily   - Effexor 300mg  daily    - Trazodone 100mg  at bedtime   > per palliative, will switch to remeron 7.5mg  qHS     PPX: SCDs, ambulation    Plan discussed with Dr. Fredricka Bonine and Dr. Nelly Rout    SUBJECTIVE     Feeling well this afternoon, says that she feels much better in terms of her energy level. Has not had any diarrhea or BM today. Tolerated a full breakfast and lunch. Pain / nausea well controlled with current medications. She is ambulating to bathroom by herself today, says she was not able to do this when she came in. She says that confusion has also been improved as well. No fevers, chills, chest pain, SOB. No other acute complaints.     OBJECTIVE     Vitals  Vitals:    07/07/20 0045 07/07/20 0438 07/07/20 0822 07/07/20 0924   BP: 153/70 169/78 172/83 142/82   Pulse: 79 74 86    Resp: 18 18 18     Temp:  36.7 ??C 36.8 ??C    TempSrc:  Oral Oral    SpO2: 97% 100% 98%    Weight:  53.5 kg (118 lb)     Height:  154.9 cm (5' 1)          Intake/Output last 3 shifts:  I/O last 3 completed shifts:  In: 65 [IV Piggyback:50]  Out: -     Physical Exam:  Gen: No apparent distress  Resp: normal work of breathing  Abd: soft, mild diffuse tenderness, non-distended. No rebound or guarding. Bowel sounds present.  Ext: No lower extremity edema, SCDs on  Skin: No rashes or lesions  Musculoskeletal: grossly normal  Psych: Appropriate patient interaction, normal mood and affect. Appropriate insight.    Labs:  All lab results last 24 hours:    Recent Results (from the past 24 hour(s))   Comprehensive Metabolic Panel    Collection Time: 07/06/20  8:41 PM   Result Value Ref Range    Sodium 134 (L) 135 - 145 mmol/L    Potassium 4.3 3.4 - 4.8 mmol/L    Chloride 104 98 - 107 mmol/L    Anion Gap 11 5 - 14 mmol/L    CO2 18.8 (L) 20.0 - 31.0 mmol/L    BUN 35 (H) 9 - 23 mg/dL    Creatinine 8.11 (H) 0.60 - 0.80 mg/dL    BUN/Creatinine Ratio 22     EGFR CKD-EPI Non-African American, Female 32 (L) >=60 mL/min/1.35m2    EGFR CKD-EPI African American, Female 37 (L) >=60 mL/min/1.82m2    Glucose 99 70 - 179 mg/dL    Calcium 9.1 8.7 - 91.4 mg/dL    Albumin 3.3 (L) 3.4 - 5.0 g/dL    Total Protein 6.6 5.7 - 8.2 g/dL    Total Bilirubin 0.6 0.3 - 1.2 mg/dL    AST 47 (H) <=78 U/L    ALT 31 10 - 49 U/L    Alkaline Phosphatase 341 (H) 46 - 116 U/L   Magnesium Level    Collection Time: 07/06/20  8:41 PM   Result Value Ref Range    Magnesium 1.5 (L) 1.6 - 2.6 mg/dL   Phosphorus Level    Collection Time: 07/06/20  8:41 PM   Result Value Ref Range    Phosphorus 2.8 2.4 - 5.1 mg/dL   Lipase Level    Collection Time: 07/06/20  8:41 PM   Result Value Ref Range Lipase 42 12 - 53 U/L   Lactate Sepsis, Venous    Collection Time: 07/06/20  8:41 PM   Result Value Ref Range    Lactate, Venous 2.0 (H) 0.5 - 1.8 mmol/L   CBC w/ Differential    Collection Time: 07/06/20  8:41 PM   Result Value Ref Range    WBC 6.4 3.6 - 11.2 10*9/L    RBC 2.72 (L) 3.95 - 5.13 10*12/L    HGB 10.0 (L) 11.3 - 14.9 g/dL    HCT 78.4 (L) 69.6 - 44.0 %    MCV 108.2 (H) 77.6 - 95.7 fL    MCH 36.6 (H) 25.9 - 32.4 pg    MCHC 33.9 32.0 - 36.0 g/dL    RDW 29.5 (H) 28.4 - 15.2 %    MPV 8.1 6.8 - 10.7 fL    Platelet 157 150 - 450 10*9/L    nRBC 0 <=4 /100 WBCs    Neutrophils % 68.6 %    Lymphocytes % 11.5 %    Monocytes % 19.0 %    Eosinophils % 0.4 %    Basophils % 0.5 %    Absolute Neutrophils 4.4 1.8 - 7.8 10*9/L    Absolute Lymphocytes 0.7 (L) 1.1 - 3.6 10*9/L    Absolute Monocytes 1.2 (H) 0.3 - 0.8 10*9/L    Absolute Eosinophils 0.0 0.0 - 0.5 10*9/L    Absolute Basophils 0.0 0.0 - 0.1 10*9/L    Macrocytosis Slight (A) Not Present   Urinalysis with Culture Reflex    Collection Time: 07/06/20  9:32 PM    Specimen: Clean Catch; Urine   Result Value Ref Range    Color, UA Yellow     Clarity, UA Clear     Specific Gravity, UA 1.025 1.005 - 1.040    pH, UA 5.0 5.0 - 9.0    Leukocyte Esterase, UA Negative Negative    Nitrite, UA Negative Negative    Protein, UA 30 mg/dL (A) Negative    Glucose, UA Negative Negative    Ketones, UA Negative Negative    Urobilinogen, UA 1.0 mg/dL     Bilirubin, UA Negative Negative    Blood, UA Negative Negative    RBC, UA <1 <4 /HPF    WBC, UA <1 0 - 5 /HPF    Squam Epithel, UA 2 0 - 5 /HPF    Bacteria, UA Many (A) None Seen /HPF   Basic metabolic panel    Collection Time: 07/07/20  5:16 AM   Result Value Ref Range    Sodium 132 (L) 135 - 145 mmol/L    Potassium 3.8 3.4 - 4.8 mmol/L    Chloride 101 98 - 107 mmol/L    CO2 18.0 (L) 20.0 - 31.0 mmol/L    Anion Gap 13 5 - 14 mmol/L    BUN 27 (H) 9 - 23 mg/dL    Creatinine 1.32 (H) 0.60 - 0.80 mg/dL    BUN/Creatinine Ratio 19 EGFR CKD-EPI Non-African American, Female 36 (L) >=60 mL/min/1.96m2    EGFR CKD-EPI African American, Female 42 (L) >=60 mL/min/1.93m2    Glucose 115 70 - 179 mg/dL    Calcium 8.4 (L) 8.7 - 10.4 mg/dL   Magnesium Level    Collection Time: 07/07/20  5:16 AM   Result Value Ref Range    Magnesium 1.7 1.6 - 2.6 mg/dL   Phosphorus Level    Collection Time: 07/07/20  5:16 AM  Result Value Ref Range    Phosphorus 2.7 2.4 - 5.1 mg/dL   Prealbumin (TTR)    Collection Time: 07/07/20  5:16 AM   Result Value Ref Range    Prealbumin 9.4 (L) 10.0 - 40.0 mg/dL   CA 478    Collection Time: 07/07/20  5:16 AM   Result Value Ref Range    CA 125 100 (H) 0 - 35 U/mL     Scribe's Attestation: Theressa Millard, MD obtained and performed the history, physical exam and medical decision making elements that were entered into the chart. Documentation assistance was provided by me personally, a scribe. Signed by Darcus Austin, Scribe, on July 07, 2020 at 6:21 AM.     ----------------------------------------------------------------------------------------------------------------------  July 07, 2020 5:07 PM. Documentation assistance provided by the Scribe. I was present during the time the encounter was recorded. The information recorded by the Scribe was done at my direction and has been reviewed and validated by me.  ----------------------------------------------------------------------------------------------------------------------    I saw and evaluated Ms. Amie Critchley , participating in the key portions of the service with the resident and fellow.  I reviewed the resident???s note and agree with the findings, assessment and plan as outlined in this note.    Diarrhea resolved  Discharge planning    Doreatha Lew  MD  ATTENDING  Gynecologic Oncology

## 2020-07-07 NOTE — Unmapped (Signed)
Admitted to unit from ED. Oriented to room and admission questions were completed. Spouse at bedside. PRN oxycodone given for pain. Benadryl was ordered to help patient sleep. No nausea, vomiting, or diarrhea noted. Stool sample needed for GI panel and guaiac fecal test.   Problem: Adult Inpatient Plan of Care  Goal: Plan of Care Review  Outcome: Progressing  Goal: Patient-Specific Goal (Individualized)  Outcome: Progressing  Goal: Absence of Hospital-Acquired Illness or Injury  Outcome: Progressing  Intervention: Identify and Manage Fall Risk  Recent Flowsheet Documentation  Taken 07/07/2020 0511 by Judeen Hammans, RN  Safety Interventions:   low bed   lighting adjusted for tasks/safety   family at bedside   fall reduction program maintained   environmental modification  Goal: Optimal Comfort and Wellbeing  Outcome: Progressing  Goal: Readiness for Transition of Care  Outcome: Progressing  Goal: Rounds/Family Conference  Outcome: Progressing     Problem: Fall Injury Risk  Goal: Absence of Fall and Fall-Related Injury  Outcome: Progressing  Intervention: Promote Injury-Free Environment  Recent Flowsheet Documentation  Taken 07/07/2020 0511 by Judeen Hammans, RN  Safety Interventions:   low bed   lighting adjusted for tasks/safety   family at bedside   fall reduction program maintained   environmental modification     Problem: Pain Acute  Goal: Acceptable Pain Control and Functional Ability  Outcome: Progressing     Problem: Nausea and Vomiting  Goal: Fluid and Electrolyte Balance  Outcome: Progressing     Problem: Diarrhea  Goal: Fluid and Electrolyte Balance  Outcome: Progressing

## 2020-07-07 NOTE — Unmapped (Signed)
Care Management  Initial Transition Planning Assessment    Per H&P: Pt is a 75 y.o. 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              General  Care Manager assessed the patient by : In person interview with patient, Medical record review, Discussion with Clinical Care team  Orientation Level: Oriented X4  Functional level prior to admission: Independent  Reason for referral: Discharge Planning    Contact/Decision Maker  Extended Emergency Contact Information  Primary Emergency Contact: Ashley Murrain  Address: [address]           [place], [place] [address] [place]  Home Phone: [phone]  Relation: Spouse    Legal Next of Kin / Guardian / POA / Advance Directives     HCDM (patient stated preference): Ashley Murrain - Spouse - [phone]    Advance Directive (Medical Treatment)  Does patient have an advance directive covering medical treatment?: Patient does not have advance directive covering medical treatment.  Reason patient does not have an advance directive covering medical treatment:: Patient does not wish to complete one at this time.    Health Care Decision Maker [HCDM] (Medical & Mental Health Treatment)  Healthcare Decision Maker: HCDM documented in the HCDM/Contact Info section.  Information offered on HCDM, Medical & Mental Health advance directives:: Patient declined information.    Advance Directive (Mental Health Treatment)  Does patient have an advance directive covering mental health treatment?: Patient does not have advance directive covering mental health treatment.  Reason patient does not have an advance directive covering mental health treatment:: Patient does not wish to complete one at this time.    Patient Information  Lives with: Spouse/significant other    Type of Residence: Private residence        Location/Detail: [facility]    Support Systems/Concerns: Spouse, Children, Family Members, Friends/Neighbors, Church/Faith Community         Home Care services in place prior to admission?: No            Equipment Currently Used at Home: none       Currently receiving outpatient dialysis?: No       Financial Information       Need for financial assistance?: No       Social Determinants of Health  Social Determinants of Health were addressed in provider documentation.  Please refer to patient history.    Discharge Needs Assessment  Concerns to be Addressed: adjustment to diagnosis/illness, discharge planning    Clinical Risk Factors: > 65, Principal Diagnosis: Cancer, Stroke, COPD, Heart Failure, AMI, Pneumonia, Joint Replacment, Multiple Diagnoses (Chronic), Palliative Care: Does this patient have an advanced or progressive serious illness?    Barriers to taking medications: No    Prior overnight hospital stay or ED visit in last 90 days: Yes    Readmission Within the Last 30 Days: no previous admission in last 30 days         Anticipated Changes Related to Illness: none    Equipment Needed After Discharge: none    Discharge Facility/Level of Care Needs: other (see comments) (home)    Readmission  Risk of Unplanned Readmission Score:  %  Predictive Model Details   No score data available for [facility] Risk of Unplanned Readmission     Readmitted Within the Last 30 Days? (No if blank)  Patient at risk for readmission?: No    Discharge Plan  Screen findings are: Discharge planning needs identified or anticipated (Comment). (Discharge needs TBD)    Expected Discharge Date: 07/10/2020    Expected Transfer from Critical Care:        Patient and/or family were provided with choice of facilities / services that are available and appropriate to meet post hospital care needs?: Other (Comment) (CM will offer choice once type of services needed, if any, is determined.)       Initial Assessment complete?: Yes

## 2020-07-07 NOTE — Unmapped (Signed)
Gynecology On Call Telephone Note    Spoke with Lisa Roca about this patient.    [name] is a 76 y.o. F with a history of advanced fallopian tube cancerwho presents to [facility] ED for failure to thrive with inability to tolerate PO over 2 days, worsening nausea, diarrhea, and abdominal pain. In the ED she is HDS, afebrile. Labs notable for Hgb 10, WBC 6.4, lactate 2.0, Cr 1.57 (stable), Mg 1.5. CTAP that shows increased disease burden, carcinomatosis, new 6cm pelvic mass with mass effect on left ureter.    Discussed recommendation to transfer to [facility] ED for further evaluation. Anticipate admission.     Lance Muss, MD PGY2  Obstetrics and Gynecology   Pager: [phone]

## 2020-07-07 NOTE — Unmapped (Signed)
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 intake as described in detail below.  Patient also with generalized abdominal discomfort and pleuritic bilateral chest wall pain.  Patient reports symptoms have worsened significantly over the last 2 days.  She notes that she has been much more confused than normal and is having difficulty with directions and word finding.  No reported fever, cough, vision changes.  Has had some intermittent dyspnea.  Has had nausea but no vomiting.  Had multiple loose stools yesterday with no bloody stools.  Took Imodium.  No further diarrhea today.  Denies any dysuria.  Last chemotherapy was 3 days ago.   2028 On initial evaluation in ED, she is alert, cachectic appearing, but in no acute distress.Vital signs are unremarkable.  Exam with clear lungs bilaterally, normal heart sounds.  Does have some tenderness to palpation of the left lateral chest wall with no obvious crepitance or deformity.  Does have some left lower quadrant tenderness to palpation with no rebound or guarding.  She appears fairly cachectic but has warm, well-perfused extremities.  No focal neurological deficit appreciated.   2034 Given her immunocompromised state, will pursue broad infectious work-up, particularly in the setting of loose stools and reported intermittent dyspnea.  As she does have some pleuritic chest pain, will evaluate for PE given her cancer history.  Her acutely worsening confusion is also worrisome, will obtain CT head to evaluate for obvious metastatic disease or ICH.  Will check CT abdomen to evaluate for progression of her known metastatic disease as well as alternative acute intra-abdominal pathology.  Suspect significant dehydration, and it does appear that her creatinine has been trending upward in the outpatient setting.  She has had significant failure to thrive with very minimal p.o. intake over the last few days.  Will administer gentle IV crystalloid hydration.  Suspect she will likely require admission.  Will treat symptomatically with morphine, Zofran, normal saline bolus.   2210 Patient with hypomagnesemia to 1.5, will replete.  Creatinine 1.57.  She has mildly elevated AST and alkaline phosphatase.  Lactate is 2.0.  Hemoglobin is 10, improved from baseline.  No leukocytosis noted.  Urinalysis with no evidence of UTI.  Phosphorus is normal.  Lipase normal.  Patient with no evidence of hypercalcemia.   2308 CTA chest and CT head are unremarkable.  However, I was called by radiology regarding her CT abdomen.  She has evidence of worsening metastatic disease with significant liver metastases.  There is also question of compression of the IVC and hepatic veins.  She has a significantly enlarging pelvic mass with increasing metastatic burden and question of alternative primary surrounding known ovarian mass.  Given this with her profound weakness and discomfort and failure to thrive, will discuss with gynecology for admission.   2309 Spoke with the on-call gynecology oncology resident.  Recommendation is for ED to ED transfer for gynecologic evaluation and possible admission.  Will speak with the [facility] for ED to ED transfer.   2333 Spoke with Dr. Sherron Flemings, who accepts the patient in ED to ED transfer.          Additional Medical Decision Making     I have reviewed the vital signs and the nursing notes. Labs and radiology results that were available during my care of the patient were independently reviewed by me and considered in my medical decision making.     I discussed the case with Dr. Illene Labrador, the ED attending physician.     Portions of this record have been created using [employer]. Dictation errors have been sought, but these may not have been identified and corrected.     ____________________________________________         History        Chief Complaint  Diarrhea      HPI   Lisa Burton is a 75 y.o. female with PMH of HTN, OSA, and recurrent metastatic ovarian cancer (currently on Cyclophosphamide 50 mg PO daily + pembrolizumab 200 mg IV q3wk + bevacizumab 15 mg/kg IV q3wk) presenting today for diarrhea. Patient reports that 2 days ago she began feeling really sick. She is hurting all over. She endorses a headache, bilateral rib pain, abdominal pain, chest pain, diarrhea, etc. She becomes short of breath after episodes of pain. She is more confused than normal and she is having word finding difficulty and recalling memories. Last night the diarrhea was every 1-2 hours. No melena or hematochezia. She endorses nausea, but no vomiting. She took Imitol today and has not had any diarrhea. She also reports chest pain. She states that her dog knocked her over 2 nights ago, but she did not lose consciousness. She recently stopped taking Oxybutynin and has not urinated in 8-12 hours. She has no appetite and is drinking very little fluids. She did force half of a sandwich down earlier today. No vision/hearing changes, unilateral numbness/weakness, fever, chills, cough, or dysuria. She last had a chemotherapy infusion 2 days ago.    Past Medical History:   Diagnosis Date   ??? ADHD (attention deficit hyperactivity disorder)    ??? Anxiety    ??? At risk for falls    ??? Baker's cyst    ??? Fractures    ??? Hypersomnia    ??? Hypertension    ??? Injury of posterior cruciate ligament    ??? Insomnia    ??? Irritable colon 07/10/2010   ??? Joint pain    ??? Joint replaced    ??? Major depressive disorder    ??? OSA (obstructive sleep apnea)    ??? Osteoarthritis    ??? Osteoporosis    ??? Ovarian cancer (CMS-HCC) 07/03/2017   ??? Personality disorder (CMS-HCC)     Borderline Personality Disorder   ??? PLMD (periodic limb movement disorder)    ??? Tear of meniscus of knee    ??? Urge incontinence 11/12/2012   ??? Vestibular dysfunction 09/16/2013       Patient Active Problem List   Diagnosis   ??? Osteoporosis   ??? Hammertoe - surgery Spring 2014   ??? Major depressive disorder   ??? Anxiety   ??? Borderline personality disorder (CMS-HCC)   ??? Rib pain on left side   ??? Restless legs syndrome   ??? Sleep apnea   ??? Irritable colon   ??? Seborrheic keratosis   ??? Periodic  limb movement disorder   ??? Benign essential HTN   ??? Knee joint replacement status   ??? Urge incontinence   ??? Piriformis muscle pain - intermittent   ??? Left lumbosacral radiculopathy   ??? Sacroiliac joint dysfunction of left side   ??? Vestibular dysfunction   ??? Falls frequently   ??? OSA on CPAP   ??? Left hip pain   ??? Encounter for long-term current use of medication   ??? Vitamin D deficiency   ??? Transient alteration of awareness   ??? Fallopian tube cancer, carcinoma (CMS-HCC)   ??? Encounter for antineoplastic chemotherapy   ??? Anemia associated with chemotherapy   ??? Pleuritic chest pain   ??? ADHD (attention deficit hyperactivity disorder), combined type   ??? 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 Closed fracture of shaft of metacarpal bone   ??? Claw toe, acquired   ??? Enthesopathy of ankle and tarsus   ??? Enthesopathy of hip region   ??? Hallux valgus, acquired   ??? Pain in joint, ankle and foot   ??? Hand joint pain   ??? Hammer toe, acquired   ??? Bunion   ??? Sprain of metacarpophalangeal joint   ??? Swelling, mass, or lump in head and neck   ??? Sprain of knee   ??? Primary localized osteoarthrosis, lower leg   ??? Posterior vitreous detachment of both eyes   ??? Osteoarthritis of knee   ??? Muscle weakness   ??? Macular hole, right eye   ??? Internal hemorrhoids   ??? History of cervical dysplasia   ??? Other specified hypothyroidism       Past Surgical History:   Procedure Laterality Date   ??? CESAREAN SECTION  1981   ??? FINGER FRACTURE SURGERY Right 10/2013    thumb   ??? FOOT SURGERY Bilateral 9/00, 01/14    hardware   ??? IR INSERT PORT AGE GREATER THAN 5 YRS  07/04/2017    IR INSERT PORT AGE GREATER THAN 5 YRS 07/04/2017 [name], MD IMG VIR [facility]   ??? PR BIL SALP-OOPH W/OMENTECT,TAH,RAD DISSECT Bilateral 07/01/2017    Procedure: BILATERAL SALPINGO-OOPHORECTOMY W/OMENTECTOMY, TOTAL ABD HYSTERECTOMY & RADICAL DISSECTION FOR DEBULKING;  Surgeon: Ermelinda Das, MD;  Location: MAIN OR [facility];  Service: Gynecology Oncology   ??? PR LAP,DIAGNOSTIC ABDOMEN N/A 07/01/2017    Procedure: Laparoscopy, Abdomen, Peritoneum, & Omentum, Diagnostic, W/Wo Collection Specimen(S) By Brushing Or Washing;  Surgeon: Ermelinda Das, MD;  Location: MAIN OR [facility];  Service: Gynecology Oncology   ??? 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 TONSILLECTOMY  1970   ??? WRIST FRACTURE SURGERY Right 1998    wrist         Current Facility-Administered Medications:   ???  magnesium sulfate in water 2 gram/50 mL (4 %) IVPB 2 g, 2 g, Intravenous, Once, Lisa Roca, MD, Last Rate: 25 mL/hr at 07/06/20 2234, 2 g at 07/06/20 2234    Current Outpatient Medications:   ???  amoxicillin (AMOXIL) 500 MG tablet, Take 4 tablets (2,000 mg total) by mouth once for 1 dose. Prior to dental cleaning, Disp: 4 tablet, Rfl: 0  ???  ARIPiprazole (ABILIFY) 10 MG tablet, Take 10 mg by mouth daily. , Disp: , Rfl:   ???  ascorbic acid, vitamin C, (VITAMIN C) 1000 MG tablet, Take 1,000 mg by mouth daily. , Disp: , Rfl:   ???  atorvastatin (LIPITOR) 10 MG tablet, Take 1 tablet (10 mg total)  by mouth nightly., Disp: 90 tablet, Rfl: 3  ???  CALCIUM ORAL, Take by mouth daily. , Disp: , Rfl:   ???  cholecalciferol, vitamin D3, (VITAMIN D3) 1,000 unit capsule, Take 1,000 Units by mouth daily. , Disp: , Rfl:   ???  cyclophosphamide (CYTOXAN) 50 mg capsule, Take 1 capsule (50 mg total) by mouth daily ., Disp: 30 capsule, Rfl: 5  ???  latanoprost (XALATAN) 0.005 % ophthalmic solution, , Disp: , Rfl:   ???  levomefol-B6-meB12-algal oil (METANX, ALGAL OIL,) 3 mg-35 mg-2 mg -90.314 mg capsule, Take 2 capsules by mouth daily., Disp: 60 capsule, Rfl: 6  ???  lisinopriL (PRINIVIL,ZESTRIL) 20 MG tablet, Take 1 tablet (20 mg total) by mouth daily., Disp: 90 tablet, Rfl: 1  ???  megestroL (MEGACE) 400 mg/10 mL (10 mL) Susp oral suspension, Take 20 mL (800 mg total) by mouth every morning., Disp: 600 mL, Rfl: 3  ???  metoclopramide (REGLAN) 10 MG tablet, Take 1 tablet (10 mg total) by mouth Three (3) times a day as needed., Disp: 90 tablet, Rfl: 1  ???  omeprazole (PRILOSEC) 20 MG capsule, Take 1 capsule (20 mg total) by mouth daily., Disp: 90 capsule, Rfl: 3  ???  ondansetron (ZOFRAN) 8 MG tablet, Take 1 tablet (8 mg total) by mouth every eight (8) hours as needed for nausea., Disp: 30 tablet, Rfl: 2  ???  oxybutynin (DITROPAN) 5 MG tablet, Take 1 tablet (5 mg total) by mouth Three (3) times a day., Disp: 90 tablet, Rfl: 3  ???  polyethylene glycol (MIRALAX) 17 gram packet, Take 8.5 g by mouth every other day. Half a packet every other day, Disp: 24 packet, Rfl: 2  ???  sodium fluoride-pot nitrate 1.1-5 % Pste, BRUSH WITH A PEA SIZED AMOUNT TWICE A DAY FOR 2 MINUTES, Disp: , Rfl:   ???  traZODone (DESYREL) 100 MG tablet, Take 100 mg by mouth nightly. , Disp: , Rfl:   ???  venlafaxine (EFFEXOR-XR) 150 MG 24 hr capsule, Take 2 capsules (300 mg total) by mouth every morning., Disp: 180 capsule, Rfl: 3    Allergies  Codeine and Sulfa (sulfonamide antibiotics)    Family History   Problem Relation Age of Onset   ??? Personality disorder Sister    ??? Cancer Father         GI cancer 28's   ??? Arthritis Father    ??? Depression Son    ??? Arthritis Sister    ??? Arthritis Mother    ??? ADD / ADHD Neg Hx    ??? Alcohol abuse Neg Hx    ??? Anxiety disorder Neg Hx    ??? Bipolar disorder Neg Hx    ??? Dementia Neg Hx    ??? Drug abuse Neg Hx    ??? OCD Neg Hx    ??? Paranoid behavior Neg Hx    ??? Physical abuse Neg Hx    ??? Schizophrenia Neg Hx    ??? Seizures Neg Hx    ??? Sexual abuse Neg Hx    ??? Sleep disorder Neg Hx        Social History  Social History     Tobacco Use   ??? Smoking status: Never Smoker   ??? Smokeless tobacco: Never Used   Vaping Use   ??? Vaping Use: Never used   Substance Use Topics   ??? Alcohol use: Yes     Alcohol/week: 1.0 - 2.0 standard drink     Types: 1 - 2  Shots of liquor per week   ??? Drug use: No       Review of Systems    Constitutional: Negative for fever.  HEENT: Negative for visual changes. Positive for headache.   Cardiovascular: Positive for chest pain.  Respiratory: Negative for cough. Positive for shortness of breath.  Gastrointestinal: Positive for abdominal pain and diarrhea. No vomiting.   Genitourinary: Negative for dysuria.    All other systems reviewed and are negative except as noted in HPI.      Physical Exam     ED Triage Vitals [07/06/20 1713]   Enc Vitals Group      BP 133/82      Heart Rate 94      SpO2 Pulse       Resp 18      Temp 36.4 ??C (97.5 ??F)      Temp Source Oral      SpO2 100 %      Weight 54.4 kg (120 lb)      Height 1.549 m (5' 1)       Constitutional: Alert and answering questions appropriately. Cachectic appearing and in no acute distress.  Eyes: Conjunctivae are normal.   ENT       Head: Normocephalic and atraumatic.       Nose: No epistaxis.       Mouth/Throat: Mucous membranes are moist, oropharynx without visible erythema or exudate.        Neck: No stridor.  Cardiovascular: Normal rate, regular rhythm. Normal S1, S2. No murmurs, rubs, or gallops.  Extremities warm, well-perfused.  Respiratory: Normal respiratory effort. Breath sounds are clear to auscultation bilaterally.  Gastrointestinal: Soft, non-distended, LLQ TTP. No rebound or guarding.   Genitourinary: Deferred.   Musculoskeletal: No obvious effusions, swelling, or long bone deformity. TTP to left lateral chest wall. No crepitance or deformity.   Neurologic: Normal speech and language. Moves all extremities spontaneously.   No gross focal neurologic deficits are appreciated.  Skin: Skin is warm, dry and intact. No rash noted.  Psychiatric: Mood and affect are normal. Speech and behavior are normal.       EKG          Radiology     XR Chest Portable    Result Date: 07/06/2020  EXAM: XR CHEST PORTABLE DATE: 07/06/2020 8:27 PM ACCESSION: [id number] UN DICTATED: 07/06/2020 8:28 PM INTERPRETATION LOCATION: [facility] CLINICAL INDICATION: 75 years old Female with DYSPNEA  COMPARISON: Chest radiographs 05/15/2020. TECHNIQUE: Portable Chest Radiograph. FINDINGS: Right internal jugular venous port is unchanged. No focal consolidation or venous congestion. No pleural effusion or pneumothorax. Stable cardiomediastinal silhouette. Chronic healed bilateral rib fractures. Previously described sternal deformity and fracture with expansile intramedullary lytic component, better visualized on most recent CT chest.     No acute cardiopulmonary abnormality.    CT Head Wo Contrast    Result Date: 07/06/2020  EXAM: Computed tomography, head or brain without contrast material. DATE: 07/06/2020 10:05 PM ACCESSION: [id number] UN DICTATED: 07/06/2020 10:09 PM INTERPRETATION LOCATION: [facility] CLINICAL INDICATION: 75 years old Female with Confusion, known gyn cancer  COMPARISON: 10/24/2019 and 06/14/2019 head CT TECHNIQUE: Axial CT images of the head  from skull base to vertex without contrast. FINDINGS: Moderate global cerebral atrophy, similar to prior. There are scattered and confluent hypodense foci within the periventricular and deep white matter, which are nonspecific but commonly associated with small vessel ischemic changes. There is no midline shift. No mass lesion. There is no evidence of acute infarct. No  acute intracranial hemorrhage. No fractures are evident. The sinuses are pneumatized. Bilateral aphakia from cataract surgery.     No acute intracranial abnormality.    CT Abdomen Pelvis with IV Contrast ONLY    Result Date: 07/06/2020  EXAM: CT ABDOMEN PELVIS W CONTRAST DATE: 07/06/2020 10:05 PM ACCESSION: [id number] UN DICTATED: 07/06/2020 10:16 PM INTERPRETATION LOCATION: [facility] CLINICAL INDICATION: Known ovarian cancer, abdominal pain, diarrhea  COMPARISON: Same day chest CT. CT head dated 05/14/2020 TECHNIQUE: A spiral CT scan of the abdomen and pelvis was obtained with IV contrast from the lung bases through the pubic symphysis. Images were reconstructed in the axial plane. Coronal and sagittal reformatted images were also provided for further evaluation. FINDINGS: LINES AND TUBES: None. LOWER THORAX: Refer to same-day CT chest for evaluation above the diaphragm. HEPATOBILIARY: Innumerable-hypoattenuating hepatic lesions which demonstrate interval increase in size/new from prior examination. These  lesions demonstrate significant extrinsic compression on the intrahepatic IVC and hepatic veins. The intrahepatic IVC is significantly stenotic with nonvisualization of a short segment , and infiltration is difficult to exclude. In addition there is significant extrinsic compression on the right hepatic vein secondary to the large metastatic lesions (2:19). The left and middle hepatic veins are also diminutive in caliber. Reference the below lesions: - 4.4 x 4.2 cm segment 3 (2:29), previously 3.5 x 2.4 cm -6.3 x 5.2 cm segment 7 (2:19), previously 4.9 x 4.3 cm Many of the larger lesions demonstrate central necrosis with extracapsular extension with thickening of the hepatic capsule. There are several small lesions which appear new from prior exam The gallbladder is mildly hydropic. No biliary dilatation. Unchanged prominence of the cystic duct SPLEEN: Unremarkable. PANCREAS: Unremarkable. ADRENALS: Unremarkable. KIDNEYS/URETERS: Delayed left nephrogram. Moderate left hydroureteronephrosis, unchanged. Patchy enhancement of the right kidney. Wall thickening with enhancement of the distal left ureter (2:100) BLADDER: Circumferential bladder wall thickening PELVIC/REPRODUCTIVE ORGANS: Status post hysterectomy and bilateral salpingo-oophorectomies. There is a large heterogeneously enhancing pelvic mass which measures 5.9 x 5.3 x 4.8 cm (2:90, 4:41,  ap x tv x cc) and is more conspicuous in the presence of contrast when compared to prior exam. Inferior to this mass is slightly hypoattenuating peripherally enhancing encasing hyperattenuating material, likely postsurgical, and measuring approximately 3.0 x 2.8 x 2.9 cm (2:93, 4:48 ap x tv x cc). This mass abuts the vaginal cuff which demonstrates enhancement at the superior aspect. In addition there is conglomerate pelvic mass exerts mass effect on the distal left ureter leading to upstream dilatation. It is difficult to determine if this mass arises from the adjacent sigmoid colon given obliteration of fat planes (2:85) GI TRACT: Small sliding hiatal hernia with thickening of the distal esophagus, likely related to reflux esophagitis. No dilated loops of bowel. Normal appendix. Mild thickening of the rectosigmoid colon PERITONEUM/RETROPERITONEUM AND MESENTERY: Trace pelvic fluid with thickening along the presacral space. Reference the following implant -Lower abdominal omental implants measures 1.9 x 2.1 cm (2:78), previously 1.5 x 1.4 cm LYMPH NODES: -Left pelvic sidewall lymph node measuring 2.5 x 1.9 cm (2:93), previously 1.2 x 2.7 cm. -Left external iliac node measures 1.4 x 1.2 cm (2:85), previously subcentimeter VESSELS: The aorta is normal in caliber.  No significant calcified atherosclerotic disease. The portal venous system is patent. However there is slightly diminutive appearance of the right portal vein. See the  HEPATOBILIARY section for evaluation of the hepatic veins and IVC BONES AND SOFT TISSUES: Multilevel degenerative disc disease and facet arthropathy with grade 1 L2 on L3 retrolisthesis, grade  1 L3 on L4 anterolisthesis and grade 1 L4 on L5 retrolisthesis. Thoracolumbar levocurvature Chronic fracture deformity of the left superior pubic ramus.     1.Status post hysterectomy and bilateral salpingo-oophorectomies. Conglomerate heterogeneously enhancing pelvic mass which is in close proximity to surgical bed and abutting the vaginal cuff. It is difficult to determine if this mass arises from the adjacent sigmoid colon given obliteration of fat planes. Differential includes local recurrence vs new primary sigmoid malignancy. Wall thickening of the rectosigmoid colon is likely reactive or may represent mild colitis 2.Innumerable hypoattenuating bilobar hepatic lesions, some of  which demonstrate central necrosis and are  increased in size from prior with several lesions concerning for worsening metastasis. 3.Extrinsic compression of the above masses on the hepatic IVC and hepatic veins which are diminutive. The intrahepatic IVC is significantly stenotic with nonvisualization of a short segment , and infiltration is difficult to exclude. The right portal vein is also diminutive 4.Trace ascites, new from prior. Interval enlargement of pelvic and pelvic implants concerning for worsening carcinomatosis. 5.Delayed left nephrogram likely related to obstructive uropathy secondary to obstruction from the pelvic mass with. moderate left hydroureteronephrosis. The hydroureteronephrosis is similar to prior study but was not well characterized without contrast. 6.Somewhat patchy enhancement of the right kidney, now bladder wall thickening and urothelial enhancement of the distal left ureter. This is likely reactive given bland urinalysis, less likely ascending infection or UTI/pyelonephritis 7.Additional incidental findings as described above 8.Refer to same-day CT chest for evaluation above the diaphragm. The findings of this study were discussed via telephone with DR. Catelyn Friel by Dr. Garth Bigness on 07/06/2020 11:08 PM.    CTA Chest W Contrast    Result Date: 07/06/2020  EXAM: CTA CHEST W CONTRAST DATE: 07/06/2020 10:05 PM ACCESSION: [id number] UN DICTATED: 07/06/2020 10:13 PM INTERPRETATION LOCATION: [facility] CLINICAL INDICATION: 75 years old Female with Cancer, pleuritic chest pain, eval for PE ; PE suspected, high prob  COMPARISON: Same day chest radiograph. CT chest dated 07/09/2019 and prior. TECHNIQUE: A helical CTA scan was obtained with IV contrast from the lung apices to the lung bases. Images were reconstructed in the axial plane. Multiplanar reformatted and MIP images were provided. FINDINGS: PULMONARY ARTERIES: No pulmonary embolism. AIRWAYS, LUNGS, PLEURA: Clear central tracheobronchial tree.  No lung consolidation. Trace bibasilar linear atelectasis. Calcified granuloma in the left lower lobe. No pleural effusion. MEDIASTINUM: Normal heart size. No pericardial effusion.  Normal caliber thoracic aorta.  No mediastinal lymphadenopathy. Right chest port with catheter tip in the right atrium. IMAGED ABDOMEN: Please see concurrent CT abdomen pelvis report. SOFT TISSUES: Unremarkable. BONES: Mild S-shaped curvature of the thoracolumbar spine. Mild multilevel degenerative change of the spine. No suspicious osseous lesions. Grossly unchanged appearance of an upper sternal cortical deformity with intramedullary stellate lucency (8:75), stable since 07/09/2019. No acute osseous abnormality. Old left-sided lateral rib fractures.     No pulmonary embolism. No acute airspace disease. Additional chronic/incidental findings as above.      Documentation assistance was provided by Andrey Farmer, Scribe on July 06, 2020 at 7:40 PM for Lisa Roca, MD    Documentation assistance was provided by the scribe in my presence.  The documentation recorded by the scribe has been reviewed by me and accurately reflects the services I personally performed.    Resa Miner. Ernst Bowler, MD, M. Renae Fickle  Chief Resident, [facility] Emergency Medicine  Phone: [phone]  Pager: [phone]        Lisa Roca, MD  Resident  07/07/20 [phone]

## 2020-07-07 NOTE — Unmapped (Signed)
Pt being treated for ovarian CA with last chemo on Monday. Pt stating she has had diarrhea, SOB, confusion, and hurt all over. Pt stating sx started two days ago but worsened yesterday. +DTS

## 2020-07-07 NOTE — Unmapped (Signed)
Attempted to call patients family members however the numbers that I am using are not working. Will call patients husband to help confirm that I have the right information.    Lisa Kirks Fredricka Bonine, MD, PhD, MPH  Clinical Fellow, Division of Gynecologic Oncology  Department of Obstetrics and Gynecology  [place] [facility]

## 2020-07-07 NOTE — Unmapped (Signed)
ISAR Screening- for all patients 65+ community-dwelling patients    1. Before the illness or injury that brought you to the Emergency Department, did you need someone to help you on a regular basis?YES  2. Since the illness or injury that brought you to the Emergency Department, have you needed more help than usual to take care of yourself?YES  3. Have you been hospitalized for one or more nights during the past six months (excluding a stay in the Emergency Department)?YES  4. In general, do you see well?  5. In general, do you have serious problems with your memory?YES  6. Do you take more than five different prescribed medications every day?YES    A score of 3 or more will require a Case Management consultation. Primary RN notified of score of 3 or more and CM paged for assessment.

## 2020-07-07 NOTE — Unmapped (Signed)
Palliative Care Consult Note    Consultation from Requesting Attending Physician:  Dossie Der,*  Service Requesting Consult:  Gynecology (GYN)  Reason for Consult Request from Attending Physician:  Evaluation of Goals of Care / Decision Making and Patient and Family Support  Primary Care Provider:  Othelia Pulling, MD  Primary Oncologist:  Dr. Duard Brady    Assessment/Plan:      SUMMARY:  This 75 y.o. 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    3/25 update:  -Desires CODE STATUS be transitioned to DNR/DNI.  -GOC: She is anticipating a discussion with Dr. Duard Brady later today regarding medical updates.  Based on prior conversations she shares that she was given a prognosis of weeks to months.  [name] is very focused on getting her practical affairs in order as she prepares for end-of-life (i.e. wills, finances, etc).  Quality of life is very important to her and she does not desire aggressive escalation of medical care if it will not add value to her quality of life. We discussed the nuances of inpatient versus home hospice.  Ideally she would like to pass away at home as long as her care needs can be met there.  Her husband is elderly and unable to assist with her care .  However, she does have a good support system through friends, family, and her synagogue community who will likely be able to assist with some of her care needs.  -See symptom recommendations below.  -Please see note from Thornton Papas, LCSW for details of psychosocial support.    Symptom Assessment and Recommendations:        #Cancer related abdominal pain  Currently well controlled with as needed Tylenol  REC: Update Tylenol order to 650 mg p.o. every 4 hours PRN (last 24 hours x 0)  -CONTINUE oxycodone 5 to 10 mg p.o. every 4 hours PRN (last 24 hours 5 mg)    #Opioid-induced constipation prevention  REC: Senna 2 tabs nightly PRN    #Low mood/Poor quality sleep/poor appetite/low mood  REC: Discontinue trazodone  due to risk for excess sedation in elderly adult population.  REC: Remeron 7.5mg  po nightly (not to be used in combination with trazodone)      Prognosis and Understanding:     Prognosis Communication: Patient shares understanding that she has an incurable cancer.  Given her recent progression of disease she shares that she was given a prognosis of weeks to months.    Family prognostic understanding: Patient states that her husband is in denial about her prognosis and only wants to think positive.  Goals of Care and Decision Making:     Decisional capacity at time of visit: FULL    Decision Maker if lacks capacity: Name:  stated patient preference,   HCDM (patient stated preference): Ashley Murrain - Spouse - [phone]    Advance Directive: no    Code status:   Code Status: Full Code     Goals of care:   -Primary goal is relief of symptom burden.  -Given her understanding that she has limited prognosis, her goal is to focus on comfort and getting her practical affairs and order as it pertains to her end-of-life.  She does not desire aggressive escalation of medical care, especially if it would not add value to her quality of life.  -She  desires that her CODE STATUS transition to DNR/DNI          Practical, Emotional, Spiritual Support / Other Communication and Counseling:    -Introduced roles and services of inpatient palliative care to [name].  -Today expresses sadness over her limited prognosis.  She feels that this came up on her sooner than she had expected.  She thought she still had years to live.  She expresses that she is not afraid of dying,  But she is worried about leaving her husband behind.  He is 15 years older than her with limited mobility and inability to care for himself.  She is very focused on getting her practical affairs in order as it pertains to her end-of-life.  She is thinking about wills, finances, estate, her grandchildren's future bar mitzvah's, hospice transition, funeral arrangements, etc.  -Her primary focus is comfort.  Hopes that she will have enough time to get her affairs in order so that she can enjoy the time she has left with friends and family.  -She feels very well supported by her friends, family, and synagogue community support.  She shares that her husband is less supportive and does not want to talk about prognosis.  She says he is unwilling to engage in conversations and only wants to stay optimistic.  This has been distressing for her as she thinks about end-of-life.  -Palliative care visit today included active and reflective listening,  supportive counseling, and summarization of today's discussion as well as goals of care.  -Communicated updates and recommendations to [facility] on team.          Thank you for this consult. Please page Barbette Merino, Juel Burrow (pager: [phone]) or Palliative Care [phone]) if there are any questions.       Subjective:     LOV:FIEP 75 y.o. 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 On cyclophosphamide/pembrolizumab/bevacizumab (last received 3/21). Presenting to the ED with worsening diarrhea, abdominal pain, and decreased PO intake.   ??      Symptom Severity and Assessment:     Pain severity and assessment: Endorses mild intermittent aching abdominal pain to right and left mid -quadrants.  Pain is typically relieved with Tylenol.  She has used low-dose oxycodone here in the hospital with sufficient relief.  Shortness of Breath: Denies  Nausea/Vomiting: Endorses occasional nausea relieved with Zofran.  Constipation: Denies constipation  Sleep: Endorses longstanding history of altered sleep.  Sleep has improved during this hospitalization with improved pain relief.  Anxiety/Depression: Reports low mood as it relates to her overall prognosis.  Feels well supported by friends, family, and her synagogue community.  Appetite: Endorses poor appetite following chemotherapy.  Denies unintentional weight loss.  Fatigue: Reports progressive weakness and fatigue.  Needs assistance when she gets out of bed.  She says she spends 90% of her day in bed since January 31 due to  weakness/deconditioning.  Delirium, Hypoactive: No  Delirium, Hyperactive: No         Allergies:  Allergies   Allergen Reactions   ??? Codeine Rash   ??? Sulfa (Sulfonamide Antibiotics) Rash and Nausea Only       Medications:  Scheduled Meds:  ??? ARIPiprazole  10 mg Oral Daily   ??? enoxaparin (LOVENOX) injection  40 mg Subcutaneous Q24H   ??? lisinopriL  20 mg Oral Daily   ??? megestrol  800 mg Oral Daily   ??? methylPREDNISolone sodium succinate  1 mg/kg/day Intravenous Q24H   ???  traZODone  50 mg Oral Nightly   ??? venlafaxine  300 mg Oral QAM     Continuous Infusions:  PRN Meds:.acetaminophen, acetaminophen, MORPhine injection, oxyCODONE **OR** oxyCODONE     Past Medical History:   Diagnosis Date   ??? ADHD (attention deficit hyperactivity disorder)    ??? Anxiety    ??? At risk for falls    ??? Baker's cyst    ??? Fractures    ??? Hypersomnia    ??? Hypertension    ??? Injury of posterior cruciate ligament    ??? Insomnia    ??? Irritable colon 07/10/2010   ??? Joint pain    ??? Joint replaced    ??? Major depressive disorder    ??? OSA (obstructive sleep apnea)    ??? Osteoarthritis    ??? Osteoporosis    ??? Ovarian cancer (CMS-HCC) 07/03/2017   ??? Personality disorder (CMS-HCC)     Borderline Personality Disorder   ??? PLMD (periodic limb movement disorder)    ??? Tear of meniscus of knee    ??? Urge incontinence 11/12/2012   ??? Vestibular dysfunction 09/16/2013       Past Surgical History:   Procedure Laterality Date   ??? CESAREAN SECTION  1981   ??? FINGER FRACTURE SURGERY Right 10/2013    thumb   ??? FOOT SURGERY Bilateral 9/00, 01/14    hardware   ??? IR INSERT PORT AGE GREATER THAN 5 YRS  07/04/2017    IR INSERT PORT AGE GREATER THAN 5 YRS 07/04/2017 [name], MD IMG VIR [facility]   ??? PR BIL SALP-OOPH W/OMENTECT,TAH,RAD DISSECT Bilateral 07/01/2017    Procedure: BILATERAL SALPINGO-OOPHORECTOMY W/OMENTECTOMY, TOTAL ABD HYSTERECTOMY & RADICAL DISSECTION FOR DEBULKING;  Surgeon: Ermelinda Das, MD;  Location: MAIN OR [facility];  Service: Gynecology Oncology   ??? PR LAP,DIAGNOSTIC ABDOMEN N/A 07/01/2017    Procedure: Laparoscopy, Abdomen, Peritoneum, & Omentum, Diagnostic, W/Wo Collection Specimen(S) By Brushing Or Washing;  Surgeon: Ermelinda Das, MD;  Location: MAIN OR [facility];  Service: Gynecology Oncology   ??? 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 TONSILLECTOMY  1970   ??? WRIST FRACTURE SURGERY Right 1998    wrist       Social History:  -Lives with her husband, Jesusita Oka, in [place] (second marriage).  -She has 1 biological son, Maisie Fus, who lives in [place].  She has 2 grandchildren ages 42 and 70.  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    Family History:  family history includes Arthritis in her father, mother, and sister; Cancer in her father; Depression in her son; Personality disorder in her sister.    Review of Systems:  A 12 system review of systems was negative except as noted in HPI.      Objective:       Function:  40% - Ambulation: Mainly bed / Unable to do any work, extensive disease / Self-Care:[employer] / Intake: Normal or reduced / Level of Conscious: Full , drowsy, or confusion    Temp:  [36.4 ??C (97.5 ??F)-36.8 ??C (98.2 ??F)] 36.8 ??C (98.2 ??F)  Heart Rate:  [74-94] 86  Resp:  [16-18] 18  BP: (133-172)/(70-83) 142/82  SpO2:  [97 %-100 %] 98 %    No intake/output data recorded.    Physical Exam:  General: Frail elderly. Chronically-ill appearing. No acute distress.  Skin: Warm/dry.  No rashes, lesions , or petechiae noted.  Eyes:: Sclera anicteric.   ENMT: Mucous  membranes moist.   Lungs: Unlabored breathing  Abdomen: Soft, nontender.   Extremities: Grossly normal range of motion.  Warm, well perfused.No edema bilaterally.  MSK: General weakness.  Up to bathroom with 1 assist  Neuro: Alert and oriented x 3, no focal defects  Psych: Attentive. Mood appropriate; no evidence of disordered thinking.    Test Results:  Lab Results   Component Value Date    WBC 6.4 07/06/2020    RBC 2.72 (L) 07/06/2020    HGB 10.0 (L) 07/06/2020    HCT 29.4 (L) 07/06/2020    MCV 108.2 (H) 07/06/2020    MCH 36.6 (H) 07/06/2020    MCHC 33.9 07/06/2020    RDW 16.6 (H) 07/06/2020    PLT 157 07/06/2020    MPV 8.1 07/06/2020     Lab Results   Component Value Date    NA 132 (L) 07/07/2020    K 3.8 07/07/2020    CL 101 07/07/2020    CO2 18.0 (L) 07/07/2020    BUN 27 (H) 07/07/2020    CREATININE 1.43 (H) 07/07/2020    GFR >= 60 03/30/2012    GLU 115 07/07/2020    CALCIUM 8.4 (L) 07/07/2020    ALBUMIN 3.3 (L) 07/06/2020    PHOS 2.7 07/07/2020      Lab Results   Component Value Date    ALKPHOS 341 (H) 07/06/2020    BILITOT 0.6 07/06/2020    BILIDIR <0.10 04/03/2020    PROT 6.6 07/06/2020    ALBUMIN 3.3 (L) 07/06/2020    ALT 31 07/06/2020    AST 47 (H) 07/06/2020       Imaging: reviewed in Epic      Total time spent with patient for evaluation & management (excluding ACP documented separately): 1 hour start time 11:45 - stop time:12:45    Greater than 50% of this time spent on counseling/coordination of care:  Yes.   See ACP Note from today for additional billable service:  No.

## 2020-07-07 NOTE — Unmapped (Signed)
Patient A&Ox4 and responds appropriately to verbal questions. Vital signs monitored. Patient ate some at breakfast this shift. Tolerated scheduled oral medicines and oral liquids. Denies nausea and no reported vomiting. No bowel movement thus far. Enteric precaution maintained r/o GI path. No voiced pain. Plan of care discussed and indicates understanding.  Problem: Adult Inpatient Plan of Care  Goal: Plan of Care Review  Outcome: Progressing  Goal: Patient-Specific Goal (Individualized)  Outcome: Progressing  Goal: Absence of Hospital-Acquired Illness or Injury  Outcome: Progressing  Intervention: Identify and Manage Fall Risk  Recent Flowsheet Documentation  Taken 07/07/2020 0700 by Maren Beach, RN  Safety Interventions:   low bed   fall reduction program maintained   family at bedside  Intervention: Prevent Skin Injury  Recent Flowsheet Documentation  Taken 07/07/2020 0703 by Maren Beach, RN  Skin Protection: incontinence pads utilized  Intervention: Prevent and Manage VTE (Venous Thromboembolism) Risk  Recent Flowsheet Documentation  Taken 07/07/2020 1305 by Maren Beach, RN  Activity Management: activity adjusted per tolerance  Goal: Optimal Comfort and Wellbeing  Outcome: Progressing  Goal: Readiness for Transition of Care  Outcome: Progressing  Goal: Rounds/Family Conference  Outcome: Progressing     Problem: Self-Care Deficit  Goal: Improved Ability to Complete Activities of Daily Living  Outcome: Progressing     Problem: Fall Injury Risk  Goal: Absence of Fall and Fall-Related Injury  Outcome: Progressing  Intervention: Promote Injury-Free Environment  Recent Flowsheet Documentation  Taken 07/07/2020 1100 by Maren Beach, RN   isolation precautions  Taken 07/07/2020 0700 by Maren Beach, RN  Safety Interventions:   low bed   fall reduction program maintained   family at bedside     Problem: Pain Acute  Goal: Acceptable Pain Control and Functional Ability  Outcome: Progressing Problem: Nausea and Vomiting  Goal: Fluid and Electrolyte Balance  Outcome: Progressing     Problem: Diarrhea  Goal: Fluid and Electrolyte Balance  Outcome: Progressing  Intervention: Manage Diarrhea  Recent Flowsheet Documentation  Taken 07/07/2020 0900 by Maren Beach, RN  Isolation Precautions: enteric precautions initiated

## 2020-07-08 LAB — CBC W/ AUTO DIFF
BASOPHILS ABSOLUTE COUNT: 0 10*9/L (ref 0.0–0.1)
BASOPHILS RELATIVE PERCENT: 0.5 %
EOSINOPHILS ABSOLUTE COUNT: 0.1 10*9/L (ref 0.0–0.5)
EOSINOPHILS RELATIVE PERCENT: 1 %
HEMATOCRIT: 23.2 % — ABNORMAL LOW (ref 34.0–44.0)
HEMOGLOBIN: 8.2 g/dL — ABNORMAL LOW (ref 11.3–14.9)
LYMPHOCYTES ABSOLUTE COUNT: 0.7 10*9/L — ABNORMAL LOW (ref 1.1–3.6)
LYMPHOCYTES RELATIVE PERCENT: 12.5 %
MEAN CORPUSCULAR HEMOGLOBIN CONC: 35.3 g/dL (ref 32.0–36.0)
MEAN CORPUSCULAR HEMOGLOBIN: 37.6 pg — ABNORMAL HIGH (ref 25.9–32.4)
MEAN CORPUSCULAR VOLUME: 106.4 fL — ABNORMAL HIGH (ref 77.6–95.7)
MEAN PLATELET VOLUME: 7.5 fL (ref 6.8–10.7)
MONOCYTES ABSOLUTE COUNT: 1.1 10*9/L — ABNORMAL HIGH (ref 0.3–0.8)
MONOCYTES RELATIVE PERCENT: 18.8 %
NEUTROPHILS ABSOLUTE COUNT: 3.9 10*9/L (ref 1.8–7.8)
NEUTROPHILS RELATIVE PERCENT: 67.2 %
PLATELET COUNT: 140 10*9/L — ABNORMAL LOW (ref 150–450)
RED BLOOD CELL COUNT: 2.18 10*12/L — ABNORMAL LOW (ref 3.95–5.13)
RED CELL DISTRIBUTION WIDTH: 16.8 % — ABNORMAL HIGH (ref 12.2–15.2)
WBC ADJUSTED: 5.8 10*9/L (ref 3.6–11.2)

## 2020-07-08 LAB — BASIC METABOLIC PANEL
ANION GAP: 9 mmol/L (ref 5–14)
BLOOD UREA NITROGEN: 31 mg/dL — ABNORMAL HIGH (ref 9–23)
BUN / CREAT RATIO: 18
CALCIUM: 8.8 mg/dL (ref 8.7–10.4)
CHLORIDE: 105 mmol/L (ref 98–107)
CO2: 22 mmol/L (ref 20.0–31.0)
CREATININE: 1.68 mg/dL — ABNORMAL HIGH
EGFR CKD-EPI AA FEMALE: 34 mL/min/{1.73_m2} — ABNORMAL LOW (ref >=60)
EGFR CKD-EPI NON-AA FEMALE: 30 mL/min/{1.73_m2} — ABNORMAL LOW (ref >=60)
GLUCOSE RANDOM: 100 mg/dL (ref 70–179)
POTASSIUM: 4 mmol/L (ref 3.4–4.8)
SODIUM: 136 mmol/L (ref 135–145)

## 2020-07-08 LAB — MAGNESIUM: MAGNESIUM: 1.7 mg/dL (ref 1.6–2.6)

## 2020-07-08 LAB — LACTATE, VENOUS, WHOLE BLOOD: LACTATE BLOOD VENOUS: 1.6 mmol/L (ref 0.5–1.8)

## 2020-07-08 LAB — PHOSPHORUS: PHOSPHORUS: 2.8 mg/dL (ref 2.4–5.1)

## 2020-07-08 MED ORDER — SENNOSIDES 8.6 MG TABLET
ORAL_TABLET | Freq: Every evening | ORAL | 2 refills | 60 days | Status: CP | PRN
Start: 2020-07-08 — End: 2020-08-07

## 2020-07-08 MED ORDER — ONDANSETRON 4 MG DISINTEGRATING TABLET
ORAL_TABLET | Freq: Three times a day (TID) | ORAL | 2 refills | 10.00000 days | Status: CP | PRN
Start: 2020-07-08 — End: 2020-07-15

## 2020-07-08 MED ORDER — ACETAMINOPHEN 325 MG TABLET
ORAL_TABLET | ORAL | 2 refills | 10 days | Status: CP | PRN
Start: 2020-07-08 — End: ?

## 2020-07-08 MED ORDER — MIRTAZAPINE 7.5 MG TABLET
ORAL_TABLET | Freq: Every evening | ORAL | 3 refills | 90.00000 days | Status: CP
Start: 2020-07-08 — End: 2020-08-07

## 2020-07-08 MED ORDER — OXYCODONE 5 MG TABLET
ORAL_TABLET | ORAL | 0 refills | 4 days | Status: CP | PRN
Start: 2020-07-08 — End: 2020-07-13

## 2020-07-08 MED ADMIN — mirtazapine (REMERON) tablet 7.5 mg: 7.5 mg | ORAL

## 2020-07-08 MED ADMIN — venlafaxine (EFFEXOR-XR) 24 hr capsule 300 mg: 300 mg | ORAL | @ 10:00:00 | Stop: 2020-07-08

## 2020-07-08 MED ADMIN — heparin (porcine) 5,000 unit/mL injection 5,000 Units: 5000 [IU] | SUBCUTANEOUS | @ 01:00:00

## 2020-07-08 MED ADMIN — acetaminophen (TYLENOL) tablet 650 mg: 650 mg | ORAL

## 2020-07-08 MED ADMIN — methylPREDNISolone sodium succinate (PF) (Solu-MEDROL) injection 53.75 mg: 1 mg/kg/d | INTRAVENOUS | @ 10:00:00 | Stop: 2020-07-08

## 2020-07-08 MED ADMIN — diphenhydrAMINE (BENADRYL) capsule/tablet 25 mg: 25 mg | ORAL | @ 04:00:00 | Stop: 2020-07-08

## 2020-07-08 MED ADMIN — heparin, porcine (PF) 100 unit/mL injection: @ 16:00:00 | Stop: 2020-07-08

## 2020-07-08 MED ADMIN — ARIPiprazole (ABILIFY) tablet 10 mg: 10 mg | ORAL | @ 14:00:00 | Stop: 2020-07-08

## 2020-07-08 MED ADMIN — heparin (porcine) 5,000 unit/mL injection 5,000 Units: 5000 [IU] | SUBCUTANEOUS | @ 10:00:00 | Stop: 2020-07-08

## 2020-07-08 NOTE — Unmapped (Signed)
Routine vital signs WNL this morning. Fair appetite with her breakfast. Denies nausea and no reported vomiting. Patient voiding in bathroom without concern. No bowel movement since admission to floor. No voiced pain. Seen by GYN Onc team and determined to be discharged home. Home care instructions given and indicates understanding. Patient discharged home as ordered and left the unit accompanied by husband and transport associate.  Problem: Adult Inpatient Plan of Care  Goal: Plan of Care Review  Outcome: Discharged to Home  Goal: Patient-Specific Goal (Individualized)  Outcome: Discharged to Home  Goal: Absence of Hospital-Acquired Illness or Injury  Outcome: Discharged to Home  Intervention: Identify and Manage Fall Risk  Recent Flowsheet Documentation  Taken 07/08/2020 0900 by Maren Beach, RN  Safety Interventions: low bed  Intervention: Prevent and Manage VTE (Venous Thromboembolism) Risk  Recent Flowsheet Documentation  Taken 07/08/2020 1108 by Maren Beach, RN  Activity Management: (walking in hallway) other (see comments)  Taken 07/08/2020 0900 by Maren Beach, RN  Activity Management: up ad lib  Goal: Optimal Comfort and Wellbeing  Outcome: Discharged to Home  Goal: Readiness for Transition of Care  Outcome: Discharged to Home  Goal: Rounds/Family Conference  Outcome: Discharged to Home     Problem: Self-Care Deficit  Goal: Improved Ability to Complete Activities of Daily Living  Outcome: Discharged to Home     Problem: Fall Injury Risk  Goal: Absence of Fall and Fall-Related Injury  Outcome: Discharged to Home  Intervention: Promote Injury-Free Environment  Recent Flowsheet Documentation  Taken 07/08/2020 0900 by Maren Beach, RN  Safety Interventions: low bed     Problem: Pain Acute  Goal: Acceptable Pain Control and Functional Ability  Outcome: Discharged to Home     Problem: Nausea and Vomiting  Goal: Fluid and Electrolyte Balance  Outcome: Discharged to Home     Problem: Diarrhea  Goal: Fluid and Electrolyte Balance  Outcome: Discharged to Home  Intervention: Manage Diarrhea  Recent Flowsheet Documentation  Taken 07/08/2020 0900 by Maren Beach, RN  Perineal Care: perineum cleansed  Isolation Precautions: enteric precautions maintained     Problem: Impaired Wound Healing  Goal: Optimal Wound Healing  Outcome: Discharged to Home  Intervention: Promote Wound Healing  Recent Flowsheet Documentation  Taken 07/08/2020 1108 by Maren Beach, RN  Activity Management: (walking in hallway) other (see comments)  Taken 07/08/2020 0900 by Maren Beach, RN  Activity Management: up ad lib     Problem: Hypertension Comorbidity  Goal: Blood Pressure in Desired Range  Outcome: Discharged to Home

## 2020-07-08 NOTE — Unmapped (Signed)
Gynecology Oncology Physician Discharge Summary    Admit Date: 07/06/2020    Discharge Date: 07/08/2020    Discharge to: Home    Discharge Service: Gynecology (GYN)    Discharge Attending Physician: Dossie Der, MD    Discharge Diagnoses:   Principal Problem:    Failure to thrive in adult  Active Problems:    Major depressive disorder    Anxiety    Borderline personality disorder (CMS-HCC)    Benign essential HTN    Fallopian tube cancer, carcinoma (CMS-HCC)     Procedures: N/A    Hospital Course: Lisa Burton is a 75 y.o. 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 Duard Brady. She was diagnosed on 07/01/17, where diagnostic laparoscopy was converted to an exploratory laparotomy, tumor debulking with total abdominal hysterectomy, bilateral salpingo-oophorectomy, infragastric omentectomy, Argon beam ablation of tumor nodules, and enterolysis. At time of this admission, she had already undergone 3 cycles of chemo. Her CT on 05/15/20 showed innumerable hypoattenuating lesions throughout her liver, chronic fracture deformity of proximal left superior pubic ramus, and enlarging/new peritoneal implants abutting small bowel mesentery measuring 1.3 and 0.9cm. Next CT abdomen/pelvis on 07/06/20 showed 5.9 x 5.3 x 4.8cm heterogenously enhancing pelvic mass, slightly hypoattenuating peripherally enhancing 3.0 x 2.8 x 2.9cm mass abutting vaginal cuff;  Moderate left hydroureteronephrosis (similar to prior); Lower abdominal omental implants, enlarged from prior; left pelvic side wall lymph nodes + left external iliac node, both enlarged; Innumerable hepatic lesions with central necrosis, increased in size; Extrinsic compression of hepatic IVC + hepatic veins, intrahepatic IVC significantly stenotic c/f possible infiltration; Trace ascites. Ca-125 on 06/05/20 was 69 (up from 19 in 03/2020), repeat on 3/25 was 100. She will hold her current chemotherapy and plan to discuss treatment options with Dr. Duard Brady on 07/24/20.    Diarrhea, abdominal pain (resolved)  At time of admission, her last chemo session was on 07/03/20. Her symptoms began worsening on 07/04/20, with intermittent diarrhea over a few weeks. She was afebrile on admission with a WBC of 6.4 and Lactate 2.0. Blood cultures were negative to date. Her urinalysis had many bacteria, urine culture showed no growth. Respiratory panel was negative. Due to no bowel movements during admission, GI pathogen panel was not collected. Patient received 24 hours of methylprednisolone due to initial concern for pembro colitis, however this diagnosis was felt to be less likely given absence of consistent symptoms and no evidence of colitis on imaging.    Failure to Thrive  She reported being unable to tolerate oral diet due to decreased appetite at home. She was able to tolerate full meals during admission and was started on Mirtazapine by Palliative care which she will continue at discharge.    Confusion, resolved  Patient with increased difficulty with word finding on admission. CTH was negative.    Cancer Related Pain  Her pain was controlled on a regimen of Tylenol 650 q4h prn, oxy 5-10mg  q4h prn with help of Palliative Care.    Chronic Kidney Disease - Hypomagnesemia  Creatinine on admission was 1.57 (baseline 1.5-1.7). Her electrolytes were repleted as needed.    Anemia  Patient with baseline Hgb 8.0-9.0. Hgb on admission  was 10.0.    ADHD/MDD/Borderline   She was continued on her home Abilify 10mg  daily, Effexor 300mg  daily, while her Trazodone 100mg  at bedtime was discontinued per Palliative Care.    On HD#3/(07/08/2020), patient was deemed stable for discharge from a Gyn Onc standpoint. She was scheduled to follow-up at Dr. Duard Brady on 07/24/2020.    Condition at Discharge: stable    Discharge Day Services:  The patient was seen by the primary team, and deemed ready for discharge.  BP 118/65  - Pulse 85  - Temp 36.8 ??C (Oral)  - Resp 18  - Ht 154.9 cm (5' 1)  - Wt 53.5 kg (118 lb)  - SpO2 97%  - BMI 22.30 kg/m??   Exam per Dr. Romeo Apple  Gen: No apparent distress  Resp: normal work of breathing  Abd: soft, mild diffuse tenderness, non-distended. No rebound or guarding. Bowel sounds present.  Ext: No lower extremity edema, SCDs on  Skin: No rashes or lesions  Musculoskeletal: grossly normal  Psych: Appropriate patient interaction, normal mood and affect. Appropriate insight.     Discharge Medications:      Your Medication List        STOP taking these medications      cyclophosphamide 50 mg capsule  Commonly known as: CYTOXAN     traZODone 100 MG tablet  Commonly known as: DESYREL            START taking these medications      acetaminophen 325 MG tablet  Commonly known as: TYLENOL  Take 2 tablets (650 mg total) by mouth every four (4) hours as needed.     mirtazapine 7.5 MG tablet  Commonly known as: REMERON  Take 1 tablet (7.5 mg total) by mouth nightly.     ondansetron 4 MG disintegrating tablet  Commonly known as: ZOFRAN-ODT  Take 1 tablet (4 mg total) by mouth every eight (8) hours as needed for up to 7 days.     oxyCODONE 5 MG immediate release tablet  Commonly known as: ROXICODONE  Take 1 tablet (5 mg total) by mouth every four (4) hours as needed for up to 5 days.     senna 8.6 mg tablet  Commonly known as: SENOKOT  Take 2 tablets by mouth nightly as needed for constipation.            CONTINUE taking these medications      ARIPiprazole 10 MG tablet  Commonly known as: ABILIFY  Take 10 mg by mouth daily.     atorvastatin 10 MG tablet  Commonly known as: LIPITOR  Take 1 tablet (10 mg total) by mouth nightly.     CALCIUM ORAL  Take by mouth daily.     latanoprost 0.005 % ophthalmic solution  Commonly known as: XALATAN  Administer 1 drop to both eyes nightly.     metoclopramide 10 MG tablet  Commonly known as: REGLAN  Take 1 tablet (10 mg total) by mouth Three (3) times a day as needed.     omeprazole 20 MG capsule  Commonly known as: PriLOSEC  Take 1 capsule (20 mg total) by mouth daily.     oxybutynin 5 MG tablet  Commonly known as: DITROPAN  Take 1 tablet (5 mg total) by mouth Three (3) times a day.     venlafaxine 150 MG 24 hr capsule  Commonly known as: EFFEXOR-XR  Take 2 capsules (300 mg total) by mouth every morning.     vitamin C 1000  MG tablet  Generic drug: ascorbic acid (vitamin C)  Take 1,000 mg by mouth daily.     VITAMIN D3 25 mcg (1,000 unit) capsule  Generic drug: cholecalciferol (vitamin D3-25 mcg (1,000 unit))  Take 1,000 Units by mouth daily.              Pending Test Results:   Pending Labs       Order Current Status    Blood Culture #1 Preliminary result    Blood Culture #2 Preliminary result            Discharge Instructions:   Activity Instructions       Activity as tolerated             Other Instructions       Call MD for:  difficulty breathing, headache or visual disturbances      Call MD for:  persistent nausea or vomiting      Call MD for:  severe uncontrolled pain      Call MD for:  temperature >38.5 Celsius      Call MD for:  vaginal bleeding saturating more than 1 pad per hour.      Discharge instructions      DISCHARGE INSTRUCTIONS    - Take all new medications as prescribed. Do not drive while taking Percocet or Oxycodone  - Keep all follow-up appointments.     When to Contact us  - Contact MD if you develop temperature >100.4, unresolvable pain, frequent vomiting, or heavy bleeding.   - If you have any questions or concerns, call Clinic at [phone] during normal business hours (Mon-Fri 8-5) and/or call hospital operator [phone] and ask for the GYN On-Call Consult pager for GYN resident on-call.    Follow up  [facility] GYN [facility]  [facility] 1st Floor - Clinic D  [address]  [place], [place] [address]    You will need a follow up with Dr. Duard Brady on 07/24/2020. If you do not see this scheduled within 1 week, please call [phone] and arrange your appointment.          Appointments which have been scheduled for you      Jul 24, 2020 11:00 AM  (Arrive by 10:30 AM)  NURSE LAB DRAW with ADULT ONC LAB  [facility] ADULT ONCOLOGY LAB DRAW STATION [place] [facility] REGION) [address]  [place] [address]  [phone]        Jul 24, 2020 12:00 PM  (Arrive by 11:30 AM)  LEVEL 180 with [employer] CHAIR 48  [place] [facility]OG[facility] INFUS[facility] [place] [facility] REGION) [address]  [place] [address]  [phone]        Aug 01, 2020  9:15 AM  (Arrive by 9:00 AM)  DH APPT with Pennie Rushing, ASOD 4TH FLR CHAIR 3  [facility] REGION) [address]  4th Floor [place] [address]  [phone]        Aug 14, 2020 11:00 AM  (Arrive by 10:30 AM)  NURSE LAB DRAW with ADULT ONC LAB  [facility] ADULT ONCOLOGY LAB DRAW STATION [place] [facility] REGION) [address]  [place] [address]  [phone]        Aug 14, 2020 12:00 PM  (Arrive by 11:30 AM)  LEVEL 150 with ONCINF CHAIR 50  [place] [facility] [place]) [address]  [place]  [address]  [phone]        Sep 04, 2020 11:00 AM  (Arrive by 10:30 AM)  NURSE LAB DRAW with ADULT ONC LAB  [facility] ADULT ONCOLOGY LAB DRAW STATION [place] [facility] REGION) [address]  [place] [address]  [phone]        Sep 04, 2020 12:00 PM  (Arrive by 11:30 AM)  LEVEL 150 with [employer] CHAIR 50  [place] [facility]OG[facility] IN[facility] [place] [facility] REGION) [address]  [place] [address]  [phone]        Sep 25, 2020 11:30 AM  (Arrive by 11:00 AM)  NURSE LAB DRAW with ADULT ONC LAB  [facility] ADULT ONCOLOGY LAB DRAW STATION [place] [facility] REGION) [address]  [place] [address]  [phone]        Sep 25, 2020 12:30 PM  (Arrive by 12:00 PM)  LEVEL 150 with [employer] CHAIR 41  [place] [facility]COLOG[facility] INFUSION [place] [facility] REGION) [address]  [place] [address]  [phone]        Nov 06, 2020 11:30 AM  (Arrive by 11:00 AM)  NURSE LAB DRAW with ADULT ONC LAB  [facility] ADULT ONCOLOGY LAB DRAW STATION [place] [facility] REGION) [address]  [place] [address]  [phone]        Nov 06, 2020 12:30 PM  (Arrive by 12:00 PM)  LEVEL 150 with [employer] CHAIR 41  [place] ONCOLOGY INFUSION [facility] REGION) [address]  [place] [address]  [phone]        Nov 09, 2020 11:40 AM  (Arrive by 11:25 AM)  OFFICE VISIT with Marca Ancona, MD  [facility]) [address] [place] [address]  [phone]        Jan 10, 2021 12:40 PM  (Arrive by 12:25 PM)  RETURN  ENDOCRINE with Leandra Kern, MD  [facility] [place] [facility] REGION) [address]  [place] [address]  [phone]             Scribe's Attestation: Georgana Curio, MD obtained and performed the history, physical exam and medical decision making elements that were  entered into the chart. Documentation assistance was provided by me personally, a scribe. Signed by Angelia Mould, scribe, on July 08, 2020 at 8:06 AM.       ----------------------------------------------------------------------------------------------------------------------  July 08, 2020 10:32 AM. Documentation assistance provided by the Scribe. I was present during the time the encounter was recorded. The information recorded by the Scribe was done at my direction and has been reviewed and validated by me.  ----------------------------------------------------------------------------------------------------------------------  I saw [name]  on the day of discharge participating in the key portions of the service on the day of discharge.  I reviewed the resident's note and agree with the discharge plans and disposition.  I personally spent less than 30 minutes in the discharge planning services.    Doreatha Lew, MD  Attending  Gyn Oncology

## 2020-07-08 NOTE — Unmapped (Signed)
Hello,    Can you please schedule this patient for follow up with Dr. Duard Brady on 07/24/20 (per Dr. Duard Brady)?    Thanks,  Eileen Stanford

## 2020-07-08 NOTE — Unmapped (Signed)
Problem: Adult Inpatient Plan of Care  Goal: Plan of Care Review  Outcome: Discharged to Home  Goal: Patient-Specific Goal (Individualized)  Outcome: Discharged to Home  Goal: Absence of Hospital-Acquired Illness or Injury  Outcome: Discharged to Home  Intervention: Identify and Manage Fall Risk  Recent Flowsheet Documentation  Taken 07/08/2020 0900 by Maren Beach, RN  Safety Interventions: low bed  Intervention: Prevent and Manage VTE (Venous Thromboembolism) Risk  Recent Flowsheet Documentation  Taken 07/08/2020 1108 by Maren Beach, RN  Activity Management: (walking in hallway) other (see comments)  Taken 07/08/2020 0900 by Maren Beach, RN  Activity Management: up ad lib  Goal: Optimal Comfort and Wellbeing  Outcome: Discharged to Home  Goal: Readiness for Transition of Care  Outcome: Discharged to Home  Goal: Rounds/Family Conference  Outcome: Discharged to Home     Problem: Self-Care Deficit  Goal: Improved Ability to Complete Activities of Daily Living  Outcome: Discharged to Home     Problem: Fall Injury Risk  Goal: Absence of Fall and Fall-Related Injury  Outcome: Discharged to Home  Intervention: Promote Injury-Free Environment  Recent Flowsheet Documentation  Taken 07/08/2020 0900 by Maren Beach, RN  Safety Interventions: low bed     Problem: Pain Acute  Goal: Acceptable Pain Control and Functional Ability  Outcome: Discharged to Home     Problem: Nausea and Vomiting  Goal: Fluid and Electrolyte Balance  Outcome: Discharged to Home     Problem: Diarrhea  Goal: Fluid and Electrolyte Balance  Outcome: Discharged to Home  Intervention: Manage Diarrhea  Recent Flowsheet Documentation  Taken 07/08/2020 0900 by Maren Beach, RN  Perineal Care: perineum cleansed  Isolation Precautions: enteric precautions maintained     Problem: Impaired Wound Healing  Goal: Optimal Wound Healing  Outcome: Discharged to Home  Intervention: Promote Wound Healing  Recent Flowsheet Documentation  Taken 07/08/2020 1108 by Maren Beach, RN  Activity Management: (walking in hallway) other (see comments)  Taken 07/08/2020 0900 by Maren Beach, RN  Activity Management: up ad lib     Problem: Hypertension Comorbidity  Goal: Blood Pressure in Desired Range  Outcome: Discharged to Home

## 2020-07-08 NOTE — Unmapped (Shared)
GYN Daily Progress Note    ASSESSMENT AND PLAN     Lisa Burton is a 75 y.o. 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    Onc:   - Primary Oncologist: Dr. 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   - 07/18/17-11/17/17: carboplatin/paclitaxel   - 08/25/18: recurrent disease    - 09/04/18-06/07/19: doxil/carboplatin   - 08/20/19-03/13/20: niraparib   - 04/20/20-05/26/20: olaparib   - 05/15/20: CT with innumerable hypoattenuating lesions throughout liver, chronic fracture deformity of proximal L superior pubic ramus, enlarging/new peritoneal implants abutting small bowel mesentery measuring 1.3 and 0.9cm   - 06/12/20: cyclophosphamide/pembrolizumab/bevacizumab   - 07/06/20: CT c/a/p with 5.9 x 5.3 x 4.8cm heterogenously enhancing pelvic mass, slightly hypoattenuating peripherally enhancing 3.0 x 2.8 x 2.9cm mass abutting vaginal cuff.  Moderate L hydroureteronephrosis (similar to prior). Lower abdominal omental implants, enlarged from prior. L pelvic side wall LN + L external iliac node, both enlarged. Innumerable hepatic lesions with central necrosis, increased in size. Extrinsic compression of hepatic IVC + hepatic veins, intrahepatic IVC significantly stenotic c/f possible infiltration. Trace ascites   - Last Ca-125 (06/05/20): 69 (up from 19 in 03/2020), repeat 100  - palliative c/s: confirmed transition to DNR/DNI status, however interested in pursuing further chemotherapy after speaking with Dr. Duard Brady 3/25. Will f/u in clinic to decide next options.   ??  Diarrhea, abdominal pain (resolving), c/f pembro colitis   - 3/21 Last chemo, worsening symptoms on 3/22. Has had intermittent diarrhea over the last few weeks  - Afebrile, WBC 6.4, Lactate 2.0   - Treat for presumed pembro colitis with methylprednisone 1mg /kg/day every day   > however since admission no diarrhea, low suspicion at this time, will clarify taper with ***  - BCx NG x24hr  - UA with many bac > UCx pending   - GI pathogen panel pending  - Resp panel neg  ??  FTT  Inability to tolerate PO d/t decreased appetite. Unclear recall of specific medications she had been taking  - s/p pharmacy c/s for clarification on meds at home  - Cont home meds: prn Zofran   > pt has not been taking megace at home and tolerating PO well, will not continue inpatient  - Albumin >3  - Consider nutrition c/s, ensure supplementation  ??  Confusion    - Increasing difficulty with word finding  - CTH negative    Cancer related pain   - Home meds: Tylenol 1000mg  TID  - Per pharm and palliative, Tylenol 650 q4h prn, oxy 5-10mg  q4h prn    ??  CKD - Hypomagnesemia   - Baseline range from 1.5-1.7 since 04/2020   - Admit Cr 1.57 > 1.43 (3/25) > 1.68 (3/26); avoid nephrotoxins  - Mag 1.5 > repleted >> 1.7 (3/26)  - Replete lytes PRN   ??  Anemia  - Baseline 8-9    - Admit Hgb 10 > 8.2 (3/26)  ??  Osteoporosis    - Hx chronic pubic ramus fracture    - ALP elevated   ??  HTN - HLD    - home meds (lisinopril) held - pt has not been taking for 2 months, per pharm recs  ??  ADHD/MDD/Borderline   - Abilify 10mg  daily   - Effexor 300mg  daily    - Trazodone 100mg  at bedtime   > per palliative, will switch to remeron 7.5mg  qHS     PPX: SCDs, ambulation    Plan discussed with Dr. Romeo Apple and Dr. Nelly Rout    SUBJECTIVE     Patient is doing well this morning. Pain/nausea is well controlled on her current regimen.  No fevers, chills, chest pain, SOB. No other acute complaints.    OBJECTIVE     Vitals  Vitals:    07/07/20 0438 07/07/20 0822 07/07/20 0924 07/08/20 0006   BP: 169/78 172/83 142/82 124/70   Pulse: 74 86  85   Resp: 18 18  18    Temp: 36.7 ??C (98.1 ??F) 36.8 ??C (98.2 ??F)  36.8 ??C (98.3 ??F) TempSrc: Oral Oral  Oral   SpO2: 100% 98%  97%   Weight: 53.5 kg (118 lb)      Height: 154.9 cm (5' 1)           Intake/Output last 3 shifts:  I/O last 3 completed shifts:  In: 21 [IV Piggyback:50]  Out: -     Physical Exam:  Exam per Dr. Romeo Apple  Gen: No apparent distress  Resp: normal work of breathing  Abd: soft, mild diffuse tenderness, non-distended. No rebound or guarding. Bowel sounds present.  Ext: No lower extremity edema, SCDs on  Skin: No rashes or lesions  Musculoskeletal: grossly normal  Psych: Appropriate patient interaction, normal mood and affect. Appropriate insight.    Labs:  All lab results last 24 hours:    Recent Results (from the past 24 hour(s))   CBC w/ Differential    Collection Time: 07/08/20  6:07 AM   Result Value Ref Range    WBC 5.8 3.6 - 11.2 10*9/L    RBC 2.18 (L) 3.95 - 5.13 10*12/L    HGB 8.2 (L) 11.3 - 14.9 g/dL    HCT 91.4 (L) 78.2 - 44.0 %    MCV 106.4 (H) 77.6 - 95.7 fL    MCH 37.6 (H) 25.9 - 32.4 pg    MCHC 35.3 32.0 - 36.0 g/dL    RDW 95.6 (H) 21.3 - 15.2 %    MPV 7.5 6.8 - 10.7 fL    Platelet 140 (L) 150 - 450 10*9/L    Neutrophils % 67.2 %    Lymphocytes % 12.5 %    Monocytes % 18.8 %    Eosinophils % 1.0 %    Basophils % 0.5 %    Absolute Neutrophils 3.9 1.8 - 7.8 10*9/L    Absolute Lymphocytes 0.7 (L) 1.1 - 3.6 10*9/L    Absolute Monocytes 1.1 (H) 0.3 - 0.8 10*9/L    Absolute Eosinophils 0.1 0.0 - 0.5 10*9/L    Absolute Basophils 0.0 0.0 - 0.1 10*9/L    Macrocytosis Slight (A) Not Present    Anisocytosis Slight (A) Not Present     Scribe's Attestation: Georgana Curio, MD obtained and performed the history, physical exam and medical decision making elements that were  entered into the chart. Documentation assistance was provided by me personally, a scribe. Signed by Angelia Mould, scribe, on July 08, 2020 at 6:57 AM.       {*** NOTE TO PROVIDER: PLEASE ADD ATTESTATION NOTING YOU AGREE WITH SCRIBE DOCUMENTATION. ***}

## 2020-07-08 NOTE — Unmapped (Signed)
Afebrile,VSS.Still to collect stool specimen for the Guaiac and GI panel test-pt hasn't moved her BM since shift change.Denied flatus but has good bowel sounds.No NV reported on this shift thus far.Reported having a difficulty in establishing her sleep even with the Remeron and had requested for Benadryl.Pt had been sound asleep since given the med.Care clustered and rest periods provided.Will monitor  Problem: Adult Inpatient Plan of Care  Goal: Plan of Care Review  Outcome: Ongoing - Unchanged  Goal: Patient-Specific Goal (Individualized)  Outcome: Ongoing - Unchanged  Goal: Absence of Hospital-Acquired Illness or Injury  Outcome: Ongoing - Unchanged  Intervention: Identify and Manage Fall Risk  Recent Flowsheet Documentation  Taken 07/07/2020 1900 by Olam Idler, RN  Safety Interventions:   low bed   lighting adjusted for tasks/safety  Intervention: Prevent Skin Injury  Recent Flowsheet Documentation  Taken 07/07/2020 1900 by Olam Idler, RN  Skin Protection:   tubing/devices free from skin contact   protective footwear used  Intervention: Prevent and Manage VTE (Venous Thromboembolism) Risk  Recent Flowsheet Documentation  Taken 07/07/2020 2100 by Olam Idler, RN  Activity Management: ambulated to bathroom  Taken 07/07/2020 1900 by Olam Idler, RN  Activity Management: activity adjusted per tolerance  Goal: Optimal Comfort and Wellbeing  Outcome: Ongoing - Unchanged  Goal: Readiness for Transition of Care  Outcome: Ongoing - Unchanged  Goal: Rounds/Family Conference  Outcome: Ongoing - Unchanged     Problem: Self-Care Deficit  Goal: Improved Ability to Complete Activities of Daily Living  Outcome: Ongoing - Unchanged     Problem: Fall Injury Risk  Goal: Absence of Fall and Fall-Related Injury  Outcome: Ongoing - Unchanged  Intervention: Promote Injury-Free Environment  Recent Flowsheet Documentation  Taken 07/07/2020 1900 by Olam Idler, RN  Safety Interventions:   low bed   lighting adjusted for tasks/safety     Problem: Pain Acute  Goal: Acceptable Pain Control and Functional Ability  Outcome: Ongoing - Unchanged     Problem: Nausea and Vomiting  Goal: Fluid and Electrolyte Balance  Outcome: Ongoing - Unchanged     Problem: Diarrhea  Goal: Fluid and Electrolyte Balance  Outcome: Ongoing - Unchanged     Problem: Impaired Wound Healing  Goal: Optimal Wound Healing  Outcome: Ongoing - Unchanged  Intervention: Promote Wound Healing  Recent Flowsheet Documentation  Taken 07/07/2020 2100 by Olam Idler, RN  Activity Management: ambulated to bathroom  Taken 07/07/2020 1900 by Olam Idler, RN  Activity Management: activity adjusted per tolerance     Problem: Hypertension Comorbidity  Goal: Blood Pressure in Desired Range  Outcome: Ongoing - Unchanged

## 2020-07-10 DIAGNOSIS — Z5111 Encounter for antineoplastic chemotherapy: Principal | ICD-10-CM

## 2020-07-10 DIAGNOSIS — C5700 Malignant neoplasm of unspecified fallopian tube: Principal | ICD-10-CM

## 2020-07-10 NOTE — Unmapped (Signed)
Patient discharged from hospital 07/08/20. What would you advise for placement on your schedule? Thank you.

## 2020-07-10 NOTE — Unmapped (Signed)
Patient called unsure if she should take lisinoril. Is not on her hospital discharge paperwork (looks like it was discontinued on 07/09/2020)    I asked her to check her blood pressure and she called back and a neighbor who is a Manufacturing systems engineer was able to help her take her Blood pressure:      Sitting 128/94, pulse 104  Standing 108/83, pulse 114    Advised she should not take the medication.    Also sent a message to front desk to schedule an appointment for hospital discharge.

## 2020-07-11 NOTE — Unmapped (Signed)
Pt aware of message, expressed understanding and no further questions, thanks

## 2020-07-11 NOTE — Unmapped (Signed)
error 

## 2020-07-11 NOTE — Unmapped (Signed)
Ok to remain off of the lisinopril but continue to record readings once daily if able, at least 3-4 times . And bring to her hospital follow up visit when it is set up.

## 2020-07-12 NOTE — Unmapped (Signed)
Specialty Medication Follow-up    Lisa Burton is a 75 y.o. female with recurrent ovarian cancer who I am seeing for follow up on their treatment with pembrolizumab, bevacizumab, and oral cyclophosphamide.     Chemotherapy: Cyclophosphamide 50 mg PO daily   Start date: 06/12/20    A/P:   1. Oral Chemotherapy: No new labs to review. Grade 2 fatigue, grade 2 pain, and grade 1 constipation all of which are stable but not yet improved. Patient will see Dr. Duard Brady prior to restarting therapy.   ?? HOLD cyclophosphamide 50 mg PO daily  ?? Obtain CBC w/diff, CMP, and Mag at next infusion    2. Constipation: Grade 1 which is not yet well-controlled and patient continues to fluctuate between constipation and diarrhea. Will continue to work toward a bowel regimen that regulates bowels more by adding docusate rather than a laxative. Patient will try a smaller dose of miralax as 1/2 cap resulted in diarrhea.   ?? Start docusate sodium 100 mg PO daily  ?? Start miralax 1/4 capful PO daily PRN constipation      I spent approximately 10 minutes in direct patient care.    Next follow up: In 1 week for toxicity assessment    Referring physician: Dr. Trey Paula, PharmD, BCOP, CPP  Gynecologic Oncology Clinic Pharmacist  Pager: [phone]    S/O: [name] was contacted via telephone regarding as a return phone call. She reports that on Saturday night she took 2 tablets of senakot and this resulted in diarrhea all Sunday. She has now not had a BM since Sunday but she also hasn't taken anything for her bowel regimen. She reports overall that she is feeling better than prior to her hospital admission however she still endorses an overall unwell feeling. She endorses some mild nausea but is more bothered by a generalized aching pain. She takes oxycodone approximately 3-4 tablets per day which does help overall. She endorses that she does feel depressed but just a mild depression and that the pain is more bothersome. Medications reviewed and updated in EPIC? No    Missed doses: ----    Labs (no labs reviewed)    Gynecologic Oncology    Gynecologic Oncology Metrics:      Dose and Schedule: Cyclophosphamide 50 mg PO daily + pembrolizumab 200 mg IV q3wk + bevacizumab 15 mg/kg IV q3wk     Chemotherapy Dose: Dose Documented     Chemotherapy Schedule: Schedule documented     NCI CTCAE: Fatigue - Grade 2, Pain - Grade 2, Constipation - Grade 1        Interventions: Oral oncolytic held for toxicity, Additional agent prescribed for side effect management  Comments: Start docusate sodium

## 2020-07-13 MED ORDER — PREDNISONE 10 MG TABLET
ORAL_TABLET | Freq: Every day | ORAL | 0 refills | 20 days | Status: CP
Start: 2020-07-13 — End: ?

## 2020-07-13 NOTE — Unmapped (Signed)
Telephone Call: Diarrhea    A/P  1. Diarrhea: Grade 2 which is new. Cannot exclude pembrolizumab induced colitis therefore will start prednisone at 0.5 mg/kg/day. Will follow up tomorrow and if no improvement will likely have to admit patient for close monitoring over the weekend and work up for infection etiology.   ?? Start prednisone 30 mg PO daily    S/O: Lisa Burton is a 76 y.o. female with recurrent ovarian cancer currently receiving cyclophosphamide, pembrolizumab, bevacizumab. She calls today to report that she is feeling worse than yesterday. Overnight she woke up with about 4 large BMs. These were accompanied by abdominal cramping. Since awaking she has had 2 more BMs. She denies taking any laxative or medications for constipation.      I spent approximately 10 minutes in patient care activities.    Referring physician: Dr. Trey Paula, PharmD, BCOP, CPP  Gynecologic Oncology Clinic Pharmacist  Pager: [phone]

## 2020-07-14 NOTE — Unmapped (Signed)
Telephone Call: Diarrhea    A/P  1. Diarrhea: Grade 1 which has restarted but given improvement without steroids this is not secondary to colitis. Will discontinue prednisone and instead utilize loperamide as needed.   ?? DISCONTINUE prednisone 30 mg PO daily  ?? Start loperamide 2 mg PO PRN after each loose stool     S/O: Lisa Burton is a 75 y.o. female with recurrent ovarian cancer currently receiving cyclophosphamide, pembrolizumab, bevacizumab. She endorses that she did not pick up her prednisone prescription until Saturday but her diarrhea resolved on Friday. She has now started to have loose BMs again today despite being on prednisone therapy. She endorses that her LLE was swollen earlier today but now she has bilateral lower extremity swelling. She has an appointment to follow up with this with urgent care.     I spent approximately 10 minutes in patient care activities.    Referring physician: Dr. Trey Paula, PharmD, BCOP, CPP  Gynecologic Oncology Clinic Pharmacist  Pager: [phone]

## 2020-07-17 ENCOUNTER — Ambulatory Visit: Admit: 2020-07-17 | Discharge: 2020-07-18 | Payer: MEDICARE

## 2020-07-17 DIAGNOSIS — R609 Edema, unspecified: Principal | ICD-10-CM

## 2020-07-17 DIAGNOSIS — B370 Candidal stomatitis: Principal | ICD-10-CM

## 2020-07-17 MED ORDER — OXYCODONE 5 MG TABLET
ORAL_TABLET | ORAL | 0 refills | 5 days | Status: CP | PRN
Start: 2020-07-17 — End: ?

## 2020-07-17 MED ORDER — FUROSEMIDE 20 MG TABLET
ORAL_TABLET | Freq: Every day | ORAL | 0 refills | 5.00000 days
Start: 2020-07-17 — End: 2020-07-22

## 2020-07-17 MED ORDER — MAGIC MOUTHWASH SUSPENSION
Freq: Three times a day (TID) | ORAL | 0 refills | 10 days
Start: 2020-07-17 — End: 2020-07-27

## 2020-07-17 NOTE — Unmapped (Signed)
Error

## 2020-07-17 NOTE — Unmapped (Signed)
Patient was seen by Monico Hoar this afternoon. He wants to see if Dr. Cathey Endow can do a phone call with her this week. Please advise. Thanks

## 2020-07-17 NOTE — Unmapped (Signed)
Assessment:     Problem List Items Addressed This Visit     None      Visit Diagnoses     Dependent edema    -  Primary    Thrush               Plan:     Patient with metastatic ovarian cancer with 24-hour history of painless bilateral dependent edema.  (7 pound weight gain in the last 10 days)  Case discussed with on-call physician Dr. Claretta Fraise  Rx for Lasix 20 mg daily x5 days  Elevate feet above the level of the heart when sitting  We are arranging follow-up phone call with her primary care provider Dr. Almyra Brace during the next 2 to 3 days  With respect to mouth lesions, this appears as if it is thrush on her buccal mucosa  Rx for Magic mouthwash 3 times daily as shown  Further follow-up per her oncologist and PCP      Subjective:     CC:    Chief Complaint   Patient presents with   ??? Foot Swelling     left foot and right foot       HPI:  Lisa Burton is a 75 y.o. female who presents for evaluation of 24-hour history of bilateral dependent edema.  She reports there is very little pain but a tingling sensation.  Patient is scheduled for a third round of chemotherapy in 1 week for metastatic ovarian cancer.  She is also complaining of what she calls cold sores in her mouth.  Denies fever, cough, shortness of breath.  Has been vaccinated x3 for COVID.  Is reporting intermittent loose stools states that this is fairly common for her.    Review of Systems:  A ten system ROS was conducted.    She reports the following pertinent positive ROS: as above      She reports the following pertinent negative ROS: as above    Allergies:  Codeine and Sulfa (sulfonamide antibiotics)    Patient Care Team:  Marca Ancona, MD as PCP - General (Family Medicine)  Marca Ancona, MD as PCP - Shelda Altes (Orthopedic Surgery)  Isaiah Serge, MD as Resident (Psychiatry)  Thamas Jaegers, LCSW as Case Manager/Social Worker (Behavioral Health)  Ermelinda Das, MD (Gynecologic Oncology)  Hanley Hays Mike Craze, MD (Ophthalmology)  Leandra Kern, MD as Consulting Physician (Endocrinology)  Ethlyn Daniels, RN as Registered Nurse (Gynecologic Oncology)  Social History     Socioeconomic History   ??? Marital status: Married     Spouse name: None   ??? Number of children: 1   ??? Years of education: None   ??? Highest education level: None   Occupational History   ??? Occupation: retired IT trainer   Tobacco Use   ??? Smoking status: Never Smoker   ??? Smokeless tobacco: Never Used   Vaping Use   ??? Vaping Use: Never used   Substance and Sexual Activity   ??? Alcohol use: Yes     Alcohol/week: 1.0 - 2.0 standard drink     Types: 1 - 2 Shots of liquor per week   ??? Drug use: No   ??? Sexual activity: None   Other Topics Concern   ??? 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  Partner is somewhat older in 23's and they live together in [place]. She has a son who is a Clinical research associate, and does litigation in [place].  He went to [employer].  She also has a granddaughter only 17 year old in 2015.    12/10/16        PCMH Components:        Family, social, cultural characteristics: social support includes  Odiss MCGrew her partner .      Patient has the following communication needs: none, per patient     Health Literacy: How confident are you that you understand your health issues/concerns, can participate in your care, and manage your care along with your physician: confident.    Behaviors Affecting Health: none, per patient    Family history of mental health illness and/or substance abuse: mental health illness: yes, as noted in family history. Sister with personality disorder    Have you been seen by any medical provider that we have not referred you to since your last visit ? No    Discussed a Living Will with the patient and the : Patient interested in education materials about a living will. Materials provided at today's visit.      Social Determinants of Health     Financial Resource Strain: Low Risk    ??? Difficulty of Paying Living Expenses: Not hard at all   Food Insecurity: No Food Insecurity   ??? Worried About Programme researcher, broadcasting/film/video in the Last Year: Never true   ??? Ran Out of Food in the Last Year: Never true   Transportation Needs: No Transportation Needs   ??? Lack of Transportation (Medical): No   ??? Lack of Transportation (Non-Medical): No   Physical Activity: Unknown   ??? Days of Exercise per Week: 0 days   ??? Minutes of Exercise per Session: Not on file   Stress: No Stress Concern Present   ??? Feeling of Stress : Not at all   Social Connections: Socially Integrated   ??? Frequency of Communication with Friends and Family: More than three times a week   ??? Frequency of Social Gatherings with Friends and Family: More than three times a week   ??? Attends Religious Services: More than 4 times per year   ??? Active Member of Clubs or Organizations: Yes   ??? Attends Banker Meetings: More than 4 times per year   ??? Marital Status: Married     Family History   Problem Relation Age of Onset   ??? Personality disorder Sister    ??? Cancer Father         GI cancer 33's   ??? Arthritis Father    ??? Depression Son    ??? Arthritis Sister    ??? Arthritis Mother    ??? ADD / ADHD Neg Hx    ??? Alcohol abuse Neg Hx    ??? Anxiety disorder Neg Hx    ??? Bipolar disorder Neg Hx    ??? Dementia Neg Hx    ??? Drug abuse Neg Hx    ??? OCD Neg Hx    ??? Paranoid behavior Neg Hx    ??? Physical abuse Neg Hx    ??? Schizophrenia Neg Hx    ??? Seizures Neg Hx    ??? Sexual abuse Neg Hx    ??? Sleep disorder Neg Hx      Past Medical History:   Diagnosis Date   ??? ADHD (attention deficit hyperactivity disorder)    ???  Anxiety    ??? At risk for falls    ??? Baker's cyst    ??? Fractures    ??? Hypersomnia    ??? Hypertension    ??? Injury of posterior cruciate ligament    ??? Insomnia    ??? Irritable colon 07/10/2010   ??? Joint pain    ??? Joint replaced    ??? Major depressive disorder    ??? OSA (obstructive sleep apnea)    ??? Osteoarthritis    ??? Osteoporosis    ??? Ovarian cancer (CMS-HCC) 07/03/2017   ??? Personality disorder (CMS-HCC)     Borderline Personality Disorder   ??? PLMD (periodic limb movement disorder)    ??? Tear of meniscus of knee    ??? Urge incontinence 11/12/2012   ??? Vestibular dysfunction 09/16/2013     OB History     Gravida   1    Para   1    Term   1    Preterm        AB        Living   1       SAB        IAB        Ectopic        Molar        Multiple        Live Births                  Past Surgical History:   Procedure Laterality Date   ??? CESAREAN SECTION  1981   ??? FINGER FRACTURE SURGERY Right 10/2013    thumb   ??? FOOT SURGERY Bilateral 9/00, 01/14    hardware   ??? IR INSERT PORT AGE GREATER THAN 5 YRS  07/04/2017    IR INSERT PORT AGE GREATER THAN 5 YRS 07/04/2017 [name], MD IMG VIR [facility]   ??? PR BIL SALP-OOPH W/OMENTECT,TAH,RAD DISSECT Bilateral 07/01/2017    Procedure: BILATERAL SALPINGO-OOPHORECTOMY W/OMENTECTOMY, TOTAL ABD HYSTERECTOMY & RADICAL DISSECTION FOR DEBULKING;  Surgeon: Ermelinda Das, MD;  Location: MAIN OR [facility];  Service: Gynecology Oncology   ??? PR LAP,DIAGNOSTIC ABDOMEN N/A 07/01/2017    Procedure: Laparoscopy, Abdomen, Peritoneum, & Omentum, Diagnostic, W/Wo Collection Specimen(S) By Brushing Or Washing;  Surgeon: Ermelinda Das, MD;  Location: MAIN OR [facility];  Service: Gynecology Oncology   ??? 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 TONSILLECTOMY  1970   ??? WRIST FRACTURE SURGERY Right 1998    wrist     Patient Active Problem List   Diagnosis   ??? Osteoporosis   ??? Hammertoe - surgery Spring 2014   ??? Major depressive disorder   ??? Anxiety   ??? Borderline personality disorder (CMS-HCC)   ??? Rib pain on left side   ??? Restless legs syndrome   ??? Sleep apnea   ??? Irritable colon   ??? Seborrheic keratosis   ??? Periodic limb movement disorder   ??? Benign essential HTN   ??? Knee joint replacement status   ??? Urge incontinence   ??? Piriformis muscle pain - intermittent   ??? Left lumbosacral radiculopathy   ??? Sacroiliac joint dysfunction of left side   ??? Vestibular dysfunction   ??? Falls frequently   ??? OSA on CPAP   ??? Left hip pain   ??? Encounter for long-term current use of medication   ??? Vitamin D deficiency   ??? Transient alteration of awareness   ??? Fallopian tube cancer,  carcinoma (CMS-HCC)   ??? Encounter for antineoplastic chemotherapy   ??? Anemia associated with chemotherapy   ??? Pleuritic chest pain   ??? ADHD (attention deficit hyperactivity disorder), combined type   ??? 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 Closed fracture of shaft of metacarpal bone   ??? Claw toe, acquired   ??? Enthesopathy of ankle and tarsus   ??? Enthesopathy of hip region   ??? Hallux valgus, acquired   ??? Pain in joint, ankle and foot   ??? Hand joint pain   ??? Hammer toe, acquired   ??? Bunion   ??? Sprain of metacarpophalangeal joint   ??? Swelling, mass, or lump in head and neck   ??? Sprain of knee   ??? Primary localized osteoarthrosis, lower leg   ??? Posterior vitreous detachment of both eyes   ??? Osteoarthritis of knee   ??? Muscle weakness   ??? Macular hole, right eye   ??? Internal hemorrhoids   ??? History of cervical dysplasia   ??? Other specified hypothyroidism   ??? Failure to thrive in adult     Immunization History   Administered Date(s) Administered   ??? COVID-19 VACC,MRNA,(PFIZER)(PF)(IM) 05/18/2019, 07/06/2019, 12/16/2019   ??? INFLUENZA INJ MDCK PF, QUAD,(FLUCELVAX)(97MO AND UP EGG FREE) 12/31/2019   ??? INFLUENZA TIV (TRI) PF (IM) 01/29/2011, 01/27/2012   ??? Influenza Vaccine Quad (IIV4 PF) 53mo+ injectable 03/10/2016, 01/20/2018, 12/17/2018   ??? Influenza Virus Vaccine, unspecified formulation 01/07/2017   ??? Influenza, High Dose (IIV4) 65 yrs & older 02/12/2013, 01/27/2014, 03/28/2015   ??? PNEUMOCOCCAL POLYSACCHARIDE 23 11/12/2012   ??? Pneumococcal Conjugate 13-Valent 02/12/2013   ??? TdaP 07/26/2012, 12/22/2019       Health Maintenance   Topic Date Due   ??? Zoster Vaccines (1 of 2) Never done   ??? COVID-19 Vaccine (4 - Booster for Pfizer series) 03/16/2020   ??? Colonoscopy  04/18/2021   ??? Mammogram Start Age 20  09/13/2021   ??? DEXA Scan-Start Age 52  01/22/2023   ??? DTaP/Tdap/Td Vaccines (3 - Td or Tdap) 12/21/2029   ??? Hepatitis C Screen  Completed   ??? Pneumococcal Vaccines  Completed   ??? Influenza Vaccine  Completed       Medications:  Current Outpatient Medications   Medication Sig Dispense Refill   ??? acetaminophen (TYLENOL) 325 MG tablet Take 2 tablets (650 mg total) by mouth every four (4) hours as needed. 120 tablet 2   ??? ARIPiprazole (ABILIFY) 10 MG tablet Take 10 mg by mouth daily.      ??? ascorbic acid, vitamin C, (VITAMIN C) 1000 MG tablet Take 1,000 mg by mouth daily.      ??? atorvastatin (LIPITOR) 10 MG tablet Take 1 tablet (10 mg total) by mouth nightly. 90 tablet 3   ??? CALCIUM ORAL Take by mouth daily.      ??? cholecalciferol, vitamin D3, (VITAMIN D3) 1,000 unit capsule Take 1,000 Units by mouth daily.      ??? latanoprost (XALATAN) 0.005 % ophthalmic solution Administer 1 drop to both eyes nightly.      ??? metoclopramide (REGLAN) 10 MG tablet Take 1 tablet (10 mg total) by mouth Three (3) times a day as needed. 90 tablet 1   ??? mirtazapine (REMERON) 7.5 MG tablet Take 1 tablet (7.5 mg total) by mouth nightly. 90 tablet 3   ??? omeprazole (PRILOSEC) 20 MG capsule Take 1 capsule (20 mg total) by mouth daily. 90 capsule 3   ???  oxybutynin (DITROPAN) 5 MG tablet Take 1 tablet (5 mg total) by mouth Three (3) times a day. 90 tablet 3   ??? oxyCODONE (ROXICODONE) 5 MG immediate release tablet Take 1 tablet (5 mg total) by mouth every four (4) hours as needed for pain. 25 tablet 0   ??? predniSONE (DELTASONE) 10 MG tablet Take 3 tablets (30 mg total) by mouth daily. 60 tablet 0   ??? senna (SENOKOT) 8.6 mg tablet Take 2 tablets by mouth nightly as needed for constipation. 120 tablet 2   ??? venlafaxine (EFFEXOR-XR) 150 MG 24 hr capsule Take 2 capsules (300 mg total) by mouth every morning. 180 capsule 3     No current facility-administered medications for this visit.         Objective:     VITAL SIGNS:    Vitals:    07/17/20 1606   BP: 140/80   Pulse: 65   Resp: 18   Temp: 36.6 ??C (97.9 ??F)   TempSrc: Temporal   SpO2: 98%   Weight: 56.9 kg (125 lb 6.4 oz)     Wt Readings from Last 2 Encounters:   07/17/20 56.9 kg (125 lb 6.4 oz)   07/07/20 53.5 kg (118 lb)       GENERAL: Chronically ill-appearing, alert, in NAD  HEAD: Normocephalic and atraumatic  EYES: No exophthalmos. EOMI. Lids- No crusting or drainage. Conjunctiva-slightly injected bilaterally.  Sclera- anicteric.   EARS: Auricles normal. EACs without excess cerumen and without redness. TMs normal. Hearing grossly intact   NOSE/MOUTH/THROAT: No nasal deformity.  Nares are patent w/o discharge.  Buccal mucosa shows whitish linear lesions consistent with thrush.  No vesiculation seen.  Mucous membranes moist  NECK: Supple w/o adenopathy, thyromegaly or masses  CHEST: Clear to auscultation bilaterally.  Respirations are unlabored.    HEART: RRR w/o murmurs  VASCULAR: Peripheral pulses 2+ and symmetric.  Pedal pulses are intact.  3+ pitting edema to mid calf bilaterally.  Left foot more edematous than the right foot.  Feet are cool to the touch   INTEGUMENT:  No rashes or concerning skin lesions.        POCT LABS AND TESTS:  No results found for this or any previous visit (from the past 24 hour(s)).

## 2020-07-18 DIAGNOSIS — R600 Localized edema: Principal | ICD-10-CM

## 2020-07-18 DIAGNOSIS — C5700 Malignant neoplasm of unspecified fallopian tube: Principal | ICD-10-CM

## 2020-07-18 DIAGNOSIS — Z298 Encounter for other specified prophylactic measures: Principal | ICD-10-CM

## 2020-07-18 NOTE — Unmapped (Signed)
Telephone Call: Lower extremity edema    A/P  1. Edema: Not physically evaluated today. Patient was prescribed furosemide but has not yet started. Will start furosemide today for lower extremity swelling.   ?? Start furosemide 20 mg PO daily x 5 days    S/O: Lisa Burton is a 76 y.o. female with recurrent ovarian cancer currently receiving pembrolizumab/bevacizumab/cyclophosphamide. She calls to endorse bilateral lower extremity swelling. This has been evaluated yesterday at urgent care where they recommended furosemide x 5 days, compression, and elevation. She has not started furosemide because she has not yet picked it up from the pharmacy.     I spent approximately 10 minutes in patient care activities.    Referring physician: Dr. Trey Paula, PharmD, BCOP, CPP  Gynecologic Oncology Clinic Pharmacist  Pager: [phone]

## 2020-07-19 DIAGNOSIS — C5700 Malignant neoplasm of unspecified fallopian tube: Principal | ICD-10-CM

## 2020-07-19 DIAGNOSIS — R600 Localized edema: Principal | ICD-10-CM

## 2020-07-19 DIAGNOSIS — Z298 Encounter for other specified prophylactic measures: Principal | ICD-10-CM

## 2020-07-19 DIAGNOSIS — R7989 Other specified abnormal findings of blood chemistry: Principal | ICD-10-CM

## 2020-07-19 LAB — COMPREHENSIVE METABOLIC PANEL
ALBUMIN: 3.2 g/dL — ABNORMAL LOW (ref 3.4–5.0)
ALKALINE PHOSPHATASE: 343 U/L — ABNORMAL HIGH (ref 46–116)
ALT (SGPT): 42 U/L (ref 10–49)
ANION GAP: 16 mmol/L — ABNORMAL HIGH (ref 5–14)
AST (SGOT): 61 U/L — ABNORMAL HIGH (ref <=34)
BILIRUBIN TOTAL: 0.7 mg/dL (ref 0.3–1.2)
BLOOD UREA NITROGEN: 49 mg/dL — ABNORMAL HIGH (ref 9–23)
BUN / CREAT RATIO: 23
CALCIUM: 8.9 mg/dL (ref 8.7–10.4)
CHLORIDE: 103 mmol/L (ref 98–107)
CO2: 15 mmol/L — ABNORMAL LOW (ref 20.0–31.0)
CREATININE: 2.17 mg/dL — ABNORMAL HIGH
EGFR CKD-EPI AA FEMALE: 25 mL/min/{1.73_m2} — ABNORMAL LOW (ref >=60)
EGFR CKD-EPI NON-AA FEMALE: 22 mL/min/{1.73_m2} — ABNORMAL LOW (ref >=60)
GLUCOSE RANDOM: 124 mg/dL (ref 70–179)
POTASSIUM: 3.8 mmol/L (ref 3.4–4.8)
PROTEIN TOTAL: 6.2 g/dL (ref 5.7–8.2)
SODIUM: 134 mmol/L — ABNORMAL LOW (ref 135–145)

## 2020-07-19 LAB — CBC W/ AUTO DIFF
BASOPHILS ABSOLUTE COUNT: 0 10*9/L (ref 0.0–0.1)
BASOPHILS RELATIVE PERCENT: 0.3 %
EOSINOPHILS ABSOLUTE COUNT: 0 10*9/L (ref 0.0–0.5)
EOSINOPHILS RELATIVE PERCENT: 0.3 %
HEMATOCRIT: 26.9 % — ABNORMAL LOW (ref 34.0–44.0)
HEMOGLOBIN: 9.3 g/dL — ABNORMAL LOW (ref 11.3–14.9)
LYMPHOCYTES ABSOLUTE COUNT: 0.4 10*9/L — ABNORMAL LOW (ref 1.1–3.6)
LYMPHOCYTES RELATIVE PERCENT: 5.8 %
MEAN CORPUSCULAR HEMOGLOBIN CONC: 34.4 g/dL (ref 32.0–36.0)
MEAN CORPUSCULAR HEMOGLOBIN: 37.7 pg — ABNORMAL HIGH (ref 25.9–32.4)
MEAN CORPUSCULAR VOLUME: 109.4 fL — ABNORMAL HIGH (ref 77.6–95.7)
MEAN PLATELET VOLUME: 7.5 fL (ref 6.8–10.7)
MONOCYTES ABSOLUTE COUNT: 0.9 10*9/L — ABNORMAL HIGH (ref 0.3–0.8)
MONOCYTES RELATIVE PERCENT: 13.9 %
NEUTROPHILS ABSOLUTE COUNT: 5 10*9/L (ref 1.8–7.8)
NEUTROPHILS RELATIVE PERCENT: 79.7 %
PLATELET COUNT: 184 10*9/L (ref 150–450)
RED BLOOD CELL COUNT: 2.46 10*12/L — ABNORMAL LOW (ref 3.95–5.13)
RED CELL DISTRIBUTION WIDTH: 17 % — ABNORMAL HIGH (ref 12.2–15.2)
WBC ADJUSTED: 6.3 10*9/L (ref 3.6–11.2)

## 2020-07-19 LAB — MAGNESIUM: MAGNESIUM: 1.3 mg/dL — ABNORMAL LOW (ref 1.6–2.6)

## 2020-07-19 NOTE — Unmapped (Signed)
GYNECOLOGY ONCOLOGY RETURN VISIT    ****Date    CHIEF COMPLAINT: ***Lower extremity swelling and mouth sores    ASSESSMENT/PLAN     Lisa Burton is a 75 y.o. 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 Both were/showed ****    Mouth sores***  LLE***      SUBJECTIVE     ?? ***On *** patient noticed bilateral lower swelling. Pt went to urgent care on 07/18/20 for bilateral lower extremity swelling. They prescribed furosemide 20 mg PO daily x 5 days, which she started on ***   ?? She currently reports she is not feeling well. She denies N/V/F/C. No CP/SOB. No change in appetite, early satiety, bloating, unintentional weight loss, or change in bowel or bladder habits. Denies vaginal bleeding, discharge or irritation.   ??       Oncology History   Fallopian tube cancer, carcinoma (CMS-HCC)   06/17/2017 Initial Diagnosis    CT:  Findings compatible with ovarian carcinoma with bilateral suspicious appearing ovarian lesions, ascites, and peritoneal thickening, predominantly within the posterior cul-de-sac, concerning for metastatic spread.   ??  06/17/17: CA 125 129     07/01/2017 Surgery    Procedure on 07/01/17: Diagnostic laparoscopy, conversion to exploratory laparotomy, tumor debulking with total hysterectomy, bilateral salpingo-oophorectomy, infragastric omentectomy, Argon beam ablation of tumor nodules, enterolysis    Operative Findings: On diagnostic laparoscopy, Fagotti score 6 (Diaphragmatic disease, Omental disease, Liver disease) though disease appeared small volume in the upper abdomen and primarily pelvic.  Ascites present. Optimal debulking was felt feasible. After laparotomy, 6x4cm plaque in the portahepatis noted which was not debulkable (argon laser coagulated). We noted a scant miliary disease along diaphragm, peritoneum, small bowel mesentery (residual). Ovaries densely adherent to multiple adherent redundant loops of rectosigmoid colon (no evidence of obstruction), and posterior uterus. Thin plaque of tumor along rectosigmoid colon and 3x3cm nodule at base of rectosigmoid mesentery(residual). Tumor nodules along the bladder reflection (resected).  No other disease noted on full abdominopelvic survey. Optimal debulking would have required debulking the porta hepatis, rectosigmoid resection, peritoneal stripping in many locations. 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 Consideration of bevacizumab versus enrollment in ATHENA trial as maintenance. Referral to genetics. BRCA immunostaining pending  ??     07/18/2017 - 11/17/2017 Chemotherapy    Chemotherapy Treatment    Treatment Goal Curative   Line of Treatment First Line   Plan Name OP OVARIAN PACLITAXEL/CARBOPLATIN   Start Date 07/21/2017   End Date 11/17/2017   Provider Ermelinda Das, MD   Chemotherapy dexamethasone (DECADRON) tablet 12 mg, 12 mg, Oral, Once, 6 of 6 cycles  Administration: 12 mg (07/21/2017), 12 mg (08/11/2017), 12 mg (09/05/2017), 12 mg (09/29/2017), 12 mg (10/28/2017), 12 mg (11/17/2017)  CARBOplatin (PARAPLATIN) 580.8 mg in sodium chloride (NS) 0.9 % 100 mL IVPB, 580.8 mg (114.2 % of original dose 508.8 mg), Intravenous, Once, 6 of 6 cycles  Dose modification:   (original dose 508.8 mg, Cycle 1)  Administration: 580.8 mg (07/21/2017), 528.6 mg (08/11/2017), 517.8 mg (09/05/2017), 529.8 mg (09/29/2017), 493.2 mg (10/28/2017), 498 mg (11/17/2017)  PACLItaxel (TAXOL) 286.98 mg in sodium chloride 0.9% NON-PVC (NS) 0.9 %  500 mL IVPB, 175 mg/m2 = 286.98 mg, Intravenous, Once, 6 of 6 cycles  Administration: 286.98 mg (07/21/2017), 285.24 mg (08/11/2017), 276.48 mg (09/05/2017), 278.28 mg (09/29/2017), 281.76 mg (10/28/2017), 283.5 mg (11/17/2017)          01/16/2018 Genetics    [employer] panel. Genetic testing is negative.     08/25/2018 Recurrence    Elevated CA-125 and imaging consistent with recurrent disease.       09/04/2018 - 06/07/2019 Chemotherapy    OP OVARIAN DOXORUBICIN LIPOSOMAL/CARBOPLATIN  DOXOrubicin Liposome 30 mg/m2 IV on day 1, CARBOplatin AUC 5 IV on day 1, every 28 days       08/20/2019 - 03/13/2020 Chemotherapy    niraparib     04/20/2020 - 05/26/2020 Chemotherapy    olaparib     05/15/2020 Recurrence    + ct in ER with liver and peritoneal metastasis. We were not aware of the scan until 06/05/20.       06/12/2020 -  Chemotherapy    OP CERVICAL PEMBROLIZUMAB  pembrolizumab 200 mg IV on day 1, every 21 days         I reviewed the patient's medical history, surgical history, medications, social history, family history and allergies. ***     OBJECTIVE     Vital Signs  BMI: There is no height or weight on file to calculate BMI.  There were no vitals taken for this visit.    Constitutional: No acute distress  Neurological: Alert and oriented to person, place, and time  Psychiatric: Mood and affect appropriate  Skin: No rashes or lesions  Neck: Supple without masses  Lymphatics: No cervical, axillary, supraclavicular, or inguinal adenopathy noted  Respiratory: Clear to auscultation bilaterally. Normal work of breathing.  Cardiovascular: Regular rate and rhythm  Gastrointestinal: Soft, nontender, nondistended. No masses or hernias appreciated. No rebound or guarding.  Extremities: ****Grossly normal, nontender with no edema.  Genitourinary:  Deferred External genitalia normal.  SSE reveals pink and well rugated vaginal mucosa.  The cervix is normal in appearance and without gross lesion. Collected {spec collected:[id number]}.  BME reveals an *** unenlarged uterus with no appreciable adnexal mass or tenderness    Laboratory & Radiologic Studies:  Lab Results   Component Value Date    WBC 6.3 07/19/2020    HGB 9.3 (L) 07/19/2020    HCT 26.9 (L) 07/19/2020    PLT 184 07/19/2020       Lab Results   Component Value Date    NA 134 (L) 07/19/2020    K 3.8 07/19/2020    CL 103 07/19/2020    CO2 15.0 (L) 07/19/2020    BUN 49 (H) 07/19/2020    CREATININE 2.17 (H) 07/19/2020    GLU 124 07/19/2020    CALCIUM 8.9 07/19/2020    MG 1.3 (L) 07/19/2020    PHOS 2.8 07/08/2020       Lab Results   Component Value Date    BILITOT 0.7 07/19/2020    BILIDIR <0.10 04/03/2020    PROT 6.2 07/19/2020    ALBUMIN 3.2 (L) 07/19/2020    ALT 42 07/19/2020    AST 61 (H) 07/19/2020    ALKPHOS 343 (H) 07/19/2020    GGT 15 04/27/2010       Lab Results   Component Value Date    PT 10.9 01/16/2011    INR 1.0 01/16/2011    APTT 30.2 01/16/2011     07/19/20 PVLs:   Final Interpretation  Right  There is no  evidence of DVT in the lower extremity. However, portions of this examination were limited- see technologist comments above. There is no evidence of obstruction proximal to the inguinal ligament or in the common femoral vein.  Left  There is no evidence of DVT in the lower extremity. However, portions of this examination were limited- see technologist comments above. There is no evidence of obstruction proximal to the inguinal ligament or in the common femoral vein. Lab Results   Component Value Date    BILITOT 0.8 07/20/2020    BILIDIR <0.10 04/03/2020    PROT 6.8 07/20/2020    ALBUMIN 3.4 07/20/2020    ALT 44 07/20/2020    AST 63 (H) 07/20/2020    ALKPHOS 375 (H) 07/20/2020    GGT 15 04/27/2010       Lab Results   Component Value Date    WBC 6.3 07/19/2020    HGB 9.3 (L) 07/19/2020    HCT 26.9 (L) 07/19/2020    PLT 184 07/19/2020       Lab Results   Component Value Date    NA 134 (L) 07/19/2020    K 3.8 07/19/2020    CL 103 07/19/2020    CO2 15.0 (L) 07/19/2020    BUN 49 (H) 07/19/2020    CREATININE 2.17 (H) 07/19/2020    GLU 124 07/19/2020    CALCIUM 8.9 07/19/2020    MG 1.3 (L) 07/19/2020    PHOS 2.8 07/08/2020       Lab Results   Component Value Date    BILITOT 0.7 07/19/2020    BILIDIR <0.10 04/03/2020    PROT 6.2 07/19/2020    ALBUMIN 3.2 (L) 07/19/2020    ALT 42 07/19/2020    AST 61 (H) 07/19/2020    ALKPHOS 343 (H) 07/19/2020    GGT 15 04/27/2010     07/19/20 PVLs:   Final Interpretation  Right  There is no evidence of DVT in the lower extremity. However, portions of this examination were limited- see technologist comments above. There is no evidence of obstruction proximal to the inguinal ligament or in the common femoral vein.  Left  There is no evidence of DVT in the lower extremity. However, portions of this examination were limited- see technologist comments above. There is no evidence of obstruction proximal to the inguinal ligament or in the common femoral vein.

## 2020-07-19 NOTE — Unmapped (Signed)
Left message on machine for patient to return my call 

## 2020-07-19 NOTE — Unmapped (Signed)
TC to pt who reports continued bilateral lower extremity swelling, that is worse in her right foot. Pt describes overall leg discomfort and strong cramps in both calves, with the right calf being worse. Pt also endorses continued confusion, stating I'm not normally this flaky. I consulted with Tanna Furry, NP and pt will come to [facility] today for bilateral lower extremity venous duplex and labs, and will follow up with [facility] tomorrow. Appointment details were reviewed with patient's husband Jesusita Oka, who will transport pt, and he verbalized understanding.

## 2020-07-19 NOTE — Unmapped (Addendum)
It was a pleasure to see you today! Thank you for choosing [facility].      If you have any questions or concerns, please do not hesitate to contact us.       Business Hours:  Physician Nurse Clinician Phone Number   Drs. Boggess & Reyne Dumas Jackalyn Lombard, RN [phone]   Drs. Gehrig & Tarri Abernethy, RN [phone]   Drs. Bae-Jump, [employer], RN [phone]   Drs. Brewster & Tamela Gammon, RN [phone]   Tanna Furry, WHNP-BC  [phone]     Scheduling: [phone]  Fax: [phone]     After hours and on weekends:  [phone]; ask for the resident on call for Gyn Oncology

## 2020-07-19 NOTE — Unmapped (Addendum)
TC to pt to report that bilateral lower extremity venous duplex performed today, is negative for DVT. Pt was informed that her creatinine is elevated, and was advised to increase her water intake and repeat labs tomorrow. Pt advised to hold Lasix until follow up appointment tomorrow. Pt verbalized understanding and all questions were answered.

## 2020-07-19 NOTE — Unmapped (Signed)
Maralyn Sago, can you please call patient. I spoke with Michele Mcalpine and then corresponded with her team at GYN Onc and her pharmacist and sounds like they have a solid plan in place to see her on 07/20/20 and then do ultrasounds of her legs and discuss the mouth ulcers etc. I do think they are a great team to help her navigate any potential side effects of her chemo/treatment and disease process and am happy to help when needed but for now I would say Dr. Duard Brady and her team are the main  drivers at this time. She can always call you and ask me questions if needed and we can consult with them, and I have been keeping her in my thoughts and prayers -- but for now, I want her to focus her energies with all that is recommended by their team and let them know when something changes. Hope this makes sense. Bottom line - we are here - but I implicitely trust Dr. Duard Brady and her team.

## 2020-07-20 ENCOUNTER — Ambulatory Visit
Admit: 2020-07-20 | Discharge: 2020-07-22 | Disposition: A | Discharge status: Home Health | Payer: MEDICARE | Source: Ambulatory Visit

## 2020-07-20 ENCOUNTER — Other Ambulatory Visit
Admit: 2020-07-20 | Discharge: 2020-07-22 | Disposition: A | Discharge status: Home Health | Payer: MEDICARE | Source: Ambulatory Visit

## 2020-07-20 ENCOUNTER — Ambulatory Visit
Admit: 2020-07-20 | Discharge: 2020-07-22 | Disposition: A | Discharge status: Home Health | Payer: MEDICARE | Source: Ambulatory Visit | Attending: Women's Health | Primary: Women's Health

## 2020-07-20 DIAGNOSIS — C5700 Malignant neoplasm of unspecified fallopian tube: Principal | ICD-10-CM

## 2020-07-20 DIAGNOSIS — R7989 Other specified abnormal findings of blood chemistry: Principal | ICD-10-CM

## 2020-07-20 DIAGNOSIS — R600 Localized edema: Principal | ICD-10-CM

## 2020-07-20 DIAGNOSIS — Z298 Encounter for other specified prophylactic measures: Principal | ICD-10-CM

## 2020-07-20 DIAGNOSIS — B370 Candidal stomatitis: Principal | ICD-10-CM

## 2020-07-20 LAB — CBC W/ AUTO DIFF
BASOPHILS ABSOLUTE COUNT: 0 10*9/L (ref 0.0–0.1)
BASOPHILS RELATIVE PERCENT: 0.6 %
EOSINOPHILS ABSOLUTE COUNT: 0.1 10*9/L (ref 0.0–0.5)
EOSINOPHILS RELATIVE PERCENT: 0.8 %
HEMATOCRIT: 29.7 % — ABNORMAL LOW (ref 34.0–44.0)
HEMOGLOBIN: 10 g/dL — ABNORMAL LOW (ref 11.3–14.9)
LYMPHOCYTES ABSOLUTE COUNT: 0.7 10*9/L — ABNORMAL LOW (ref 1.1–3.6)
LYMPHOCYTES RELATIVE PERCENT: 10.4 %
MEAN CORPUSCULAR HEMOGLOBIN CONC: 33.6 g/dL (ref 32.0–36.0)
MEAN CORPUSCULAR HEMOGLOBIN: 37.5 pg — ABNORMAL HIGH (ref 25.9–32.4)
MEAN CORPUSCULAR VOLUME: 111.8 fL — ABNORMAL HIGH (ref 77.6–95.7)
MEAN PLATELET VOLUME: 8.3 fL (ref 6.8–10.7)
MONOCYTES ABSOLUTE COUNT: 1.2 10*9/L — ABNORMAL HIGH (ref 0.3–0.8)
MONOCYTES RELATIVE PERCENT: 16.7 %
NEUTROPHILS ABSOLUTE COUNT: 5.1 10*9/L (ref 1.8–7.8)
NEUTROPHILS RELATIVE PERCENT: 71.5 %
NUCLEATED RED BLOOD CELLS: 0 /100{WBCs} (ref <=4)
PLATELET COUNT: 223 10*9/L (ref 150–450)
RED BLOOD CELL COUNT: 2.66 10*12/L — ABNORMAL LOW (ref 3.95–5.13)
RED CELL DISTRIBUTION WIDTH: 17.2 % — ABNORMAL HIGH (ref 12.2–15.2)
WBC ADJUSTED: 7.2 10*9/L (ref 3.6–11.2)

## 2020-07-20 LAB — COMPREHENSIVE METABOLIC PANEL
ALBUMIN: 3.4 g/dL (ref 3.4–5.0)
ALBUMIN: 3 g/dL — ABNORMAL LOW (ref 3.4–5.0)
ALKALINE PHOSPHATASE: 375 U/L — ABNORMAL HIGH (ref 46–116)
ALKALINE PHOSPHATASE: 350 U/L — ABNORMAL HIGH (ref 46–116)
ALT (SGPT): 44 U/L (ref 10–49)
ALT (SGPT): 42 U/L (ref 10–49)
ANION GAP: 17 mmol/L — ABNORMAL HIGH (ref 5–14)
ANION GAP: 15 mmol/L — ABNORMAL HIGH (ref 5–14)
AST (SGOT): 63 U/L — ABNORMAL HIGH (ref <=34)
AST (SGOT): 60 U/L — ABNORMAL HIGH (ref <=34)
BILIRUBIN TOTAL: 0.8 mg/dL (ref 0.3–1.2)
BILIRUBIN TOTAL: 0.7 mg/dL (ref 0.3–1.2)
BLOOD UREA NITROGEN: 56 mg/dL — ABNORMAL HIGH (ref 9–23)
BLOOD UREA NITROGEN: 60 mg/dL — ABNORMAL HIGH (ref 9–23)
BUN / CREAT RATIO: 22
BUN / CREAT RATIO: 24
CALCIUM: 8.8 mg/dL (ref 8.7–10.4)
CALCIUM: 8.6 mg/dL — ABNORMAL LOW (ref 8.7–10.4)
CHLORIDE: 102 mmol/L (ref 98–107)
CHLORIDE: 101 mmol/L (ref 98–107)
CO2: 14.4 mmol/L — ABNORMAL LOW (ref 20.0–31.0)
CO2: 18 mmol/L — ABNORMAL LOW (ref 20.0–31.0)
CREATININE: 2.55 mg/dL — ABNORMAL HIGH
CREATININE: 2.51 mg/dL — ABNORMAL HIGH
EGFR CKD-EPI AA FEMALE: 21 mL/min/{1.73_m2} — ABNORMAL LOW (ref >=60)
EGFR CKD-EPI AA FEMALE: 21 mL/min/{1.73_m2} — ABNORMAL LOW (ref >=60)
EGFR CKD-EPI NON-AA FEMALE: 18 mL/min/{1.73_m2} — ABNORMAL LOW (ref >=60)
EGFR CKD-EPI NON-AA FEMALE: 18 mL/min/{1.73_m2} — ABNORMAL LOW (ref >=60)
GLUCOSE RANDOM: 119 mg/dL (ref 70–179)
GLUCOSE RANDOM: 101 mg/dL (ref 70–179)
POTASSIUM: 3.7 mmol/L (ref 3.4–4.8)
POTASSIUM: 4.2 mmol/L (ref 3.4–4.8)
PROTEIN TOTAL: 6.8 g/dL (ref 5.7–8.2)
PROTEIN TOTAL: 6 g/dL (ref 5.7–8.2)
SODIUM: 133 mmol/L — ABNORMAL LOW (ref 135–145)
SODIUM: 134 mmol/L — ABNORMAL LOW (ref 135–145)

## 2020-07-20 LAB — CBC
HEMATOCRIT: 25.5 % — ABNORMAL LOW (ref 34.0–44.0)
HEMOGLOBIN: 9.2 g/dL — ABNORMAL LOW (ref 11.3–14.9)
MEAN CORPUSCULAR HEMOGLOBIN CONC: 36 g/dL (ref 32.0–36.0)
MEAN CORPUSCULAR HEMOGLOBIN: 38.7 pg — ABNORMAL HIGH (ref 25.9–32.4)
MEAN CORPUSCULAR VOLUME: 107.5 fL — ABNORMAL HIGH (ref 77.6–95.7)
MEAN PLATELET VOLUME: 7.8 fL (ref 6.8–10.7)
PLATELET COUNT: 184 10*9/L (ref 150–450)
RED BLOOD CELL COUNT: 2.37 10*12/L — ABNORMAL LOW (ref 3.95–5.13)
RED CELL DISTRIBUTION WIDTH: 16.8 % — ABNORMAL HIGH (ref 12.2–15.2)
WBC ADJUSTED: 6.7 10*9/L (ref 3.6–11.2)

## 2020-07-20 LAB — MAGNESIUM
MAGNESIUM: 1.4 mg/dL — ABNORMAL LOW (ref 1.6–2.6)
MAGNESIUM: 1.3 mg/dL — ABNORMAL LOW (ref 1.6–2.6)

## 2020-07-20 LAB — CREATININE
CREATININE: 2.55 mg/dL — ABNORMAL HIGH
EGFR CKD-EPI AA FEMALE: 21 mL/min/{1.73_m2} — ABNORMAL LOW (ref >=60)
EGFR CKD-EPI NON-AA FEMALE: 18 mL/min/{1.73_m2} — ABNORMAL LOW (ref >=60)

## 2020-07-20 LAB — LACTATE, VENOUS, WHOLE BLOOD: LACTATE BLOOD VENOUS: 2.1 mmol/L — ABNORMAL HIGH (ref 0.5–1.8)

## 2020-07-20 LAB — PHOSPHORUS: PHOSPHORUS: 5.9 mg/dL — ABNORMAL HIGH (ref 2.4–5.1)

## 2020-07-20 LAB — PROTEIN, TOTAL: PROTEIN TOTAL: 6.8 g/dL (ref 5.7–8.2)

## 2020-07-20 MED ORDER — NYSTATIN 100,000 UNIT/ML ORAL SUSPENSION
Freq: Four times a day (QID) | ORAL | 0 refills | 3 days | Status: CP
Start: 2020-07-20 — End: ?

## 2020-07-20 MED ADMIN — magnesium sulfate 2gm/50mL IVPB: 2 g | INTRAVENOUS | @ 20:00:00 | Stop: 2020-07-20

## 2020-07-20 MED ADMIN — pantoprazole (PROTONIX) EC tablet 20 mg: 20 mg | ORAL | @ 18:00:00

## 2020-07-20 MED ADMIN — lactated ringers bolus 1,000 mL: 1000 mL | INTRAVENOUS | @ 18:00:00 | Stop: 2020-07-20

## 2020-07-20 MED ADMIN — hydrocortisone 1 % cream: TOPICAL | @ 22:00:00

## 2020-07-20 MED ADMIN — magnesium sulfate 2gm/50mL IVPB: 2 g | INTRAVENOUS | @ 19:00:00 | Stop: 2020-07-20

## 2020-07-20 MED ADMIN — lactated Ringers infusion: 50 mL/h | INTRAVENOUS | @ 18:00:00

## 2020-07-20 MED ADMIN — ARIPiprazole (ABILIFY) tablet 10 mg: 10 mg | ORAL | @ 18:00:00

## 2020-07-20 NOTE — Unmapped (Signed)
Called patient, relayed message

## 2020-07-20 NOTE — Unmapped (Signed)
GYN ONC History and Physical    ASSESSMENT & PLAN     Lisa Burton is a 75 y.o. 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  ??  Active Problems:    Major depressive disorder    Borderline personality disorder (CMS-HCC)    Fallopian tube cancer, carcinoma (CMS-HCC)    Encounter for antineoplastic chemotherapy  ??     Onc: Dr. 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   - 07/18/17-11/17/17: carboplatin/paclitaxel   - 08/25/18: recurrent disease    - 09/04/18-06/07/19: doxil/carboplatin   - 08/20/19-03/13/20: niraparib   - 04/20/20-05/26/20: olaparib   - 05/15/20: CT with innumerable hypoattenuating lesions throughout liver, chronic fracture deformity of proximal L superior pubic ramus, enlarging/new peritoneal implants abutting small bowel mesentery measuring 1.3 and 0.9cm   - 06/12/20: cyclophosphamide/pembrolizumab/bevacizumab   - 07/06/20: CT c/a/p with 5.9 x 5.3 x 4.8cm heterogenously enhancing pelvic mass, slightly hypoattenuating peripherally enhancing 3.0 x 2.8 x 2.9cm mass abutting vaginal cuff.  Moderate L hydroureteronephrosis (similar to prior). Lower abdominal omental implants, enlarged from prior. L pelvic side wall LN + L external iliac node, both enlarged. Innumerable hepatic lesions with central necrosis, increased in size. Extrinsic compression of hepatic IVC + hepatic veins, intrahepatic IVC significantly stenotic c/f possible infiltration. Trace ascites   - Last Ca-125 (07/07/20): 100   ??  FTT  Inability to PO intake d/t decreased appetite/desire.   - Cont home meds: Zofran/Reglan PRN, omeprazole, Remeron    - Albumin 3.0  - Nutrition c/s  - Palliative c/s, possible psych c/s pending palliative recs  ??  Diarrhea c/f pembro colitis  - 3/21 Last chemo, worsening symptoms since then, briefly improved during last admission w/ administration of prednisone, but acutely worsened a day or two after returning home  - Afebrile, WBC 7.2, Lactate 2.1  ??  Confusion    - Increasing difficulty with word finding and was lost in [facility]  - Patient A&Ox4, able to converse without difficulty  - Likely secondary to dehydration and malnutrition however due to recent falls will consider head CT    AKI on CKD - Hypomagnesemia   - Baseline range from Cr 1.5-1.7 > 2.17 (4/6)> 2.55 (4/7)  - mIVF at 56ml/hr  - avoid nephrotoxins   - Mag 1.4 > will replete  - Replete lytes PRN   - PM BMP  ??  HTN - HLD    - Holding home meds, has not been taking since prior to last admission  ??  ADHD/MDD/Borderline   - Abilify 10mg  daily   - Effexor 300mg  daily    ??  Prophylaxis: SCDs, Heparin   ??  Code Status: DNR/DNI  ??  Disposition: floor  ??  Plan discussed with Dr. Jonny Ruiz. Attending, Dr. Reyne Dumas immediately available.     HPI     Chief Complaint: Failure to thrive      Lisa Burton is a 75 y.o. with stage IIIC HG carcinoma of the fallopian tube who presents with failure to thrive. Upon interview Lisa Burton states that she has been extremely dehydrated and feels lousy. She has noticed a change in her diet in the last few months, specifically since January 31st. On the 31st  is when she found out she had a reoccurrence if her cancer. She states that in terms of her diet she usually skips one or two meals a day. She states that during the week on average she will not eat two or three days. When asked why she does not eat she states that she is not hungry. When asked why she does not force herself to eat she states because it makes her feel ugly and bloated. She only took her Remeron two or three days after discharge. She has been drinking between one to 3 cups of water a day. However, some days she does not drink anything. Recently, within the last 2 weeks, she has been drinking beer daily. She states that it is the only thing that she is craving. When she drinks beer she drinks between one and two beers but states she does not always finish her drink. Of note, Lisa Burton spoke with urgent care on 4/4 and was given 20mg  of Lasix daily for x5 days due to a 7 lbs weight gain.  She states she only took one pill and then was told by Dr. Duard Brady to stop.     Lisa Burton denies any dizziness or lightheadedness but states that she has recently had a few falls. She describes a few in the last couple of weeks. She was not seen at [facility] after these falls, but was seen at urgent care earlier this week. Her most recent fall she describes as occurring as she was going to the bathroom. When she fell she needed assistance to get up. She denies hurting her self during this fall but states that she hit the back of her head. She did not loose consciousness.       She also endorses diarhea, which has been occurring for the last few months. She states that she has daily diarhea and that she has woken up with diarhea in her depends. She states that she wakes up having had a diahrea in her sleep about two times a week.    She denies any current headaches, shortness of breath or chest pain. She denies any current nausea or vomiting. She denies fever or dysuria.She denies being in any physical pain.     In terms of her mood. She states that she does not think she is depressed. She does endorse increased fatigue and says that there are days where she will sleep 22 hours of the day. Her grandchildren still bring her the same amount of joy as they always do. She denies any SI or HI. Her goal for this admission is to feel better.      Oncology History:  Oncology History   Fallopian tube cancer, carcinoma (CMS-HCC)   06/17/2017 Initial Diagnosis    CT:  Findings compatible with ovarian carcinoma with bilateral suspicious appearing ovarian lesions, ascites, and peritoneal thickening, predominantly within the posterior cul-de-sac, concerning for metastatic spread.   ??  06/17/17: CA 125 129     07/01/2017 Surgery    Procedure on 07/01/17: Diagnostic laparoscopy, conversion to exploratory laparotomy, tumor debulking with total hysterectomy, bilateral salpingo-oophorectomy, infragastric omentectomy, Argon beam ablation of tumor nodules, enterolysis    Operative Findings: On diagnostic laparoscopy, Fagotti score 6 (Diaphragmatic disease, Omental disease, Liver disease) though disease appeared small volume in the upper abdomen and primarily pelvic.  Ascites present. Optimal debulking was felt feasible. After laparotomy, 6x4cm plaque in the portahepatis noted which was not debulkable (argon laser coagulated). We noted a scant miliary  disease along diaphragm, peritoneum, small bowel mesentery (residual). Ovaries densely adherent to multiple adherent redundant loops of rectosigmoid colon (no evidence of obstruction), and posterior uterus. Thin plaque of tumor along rectosigmoid colon and 3x3cm nodule at base of rectosigmoid mesentery(residual). Tumor nodules along the bladder reflection (resected).  No other disease noted on full abdominopelvic survey. Optimal debulking would have required debulking the porta hepatis, rectosigmoid resection, peritoneal stripping in many locations. 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 Consideration of bevacizumab versus enrollment in ATHENA trial as maintenance. Referral to genetics. BRCA immunostaining pending  ??     07/18/2017 - 11/17/2017 Chemotherapy    Chemotherapy Treatment    Treatment Goal Curative   Line of Treatment First Line   Plan Name OP OVARIAN PACLITAXEL/CARBOPLATIN   Start Date 07/21/2017   End Date 11/17/2017   Provider Ermelinda Das, MD   Chemotherapy dexamethasone (DECADRON) tablet 12 mg, 12 mg, Oral, Once, 6 of 6 cycles  Administration: 12 mg (07/21/2017), 12 mg (08/11/2017), 12 mg (09/05/2017), 12 mg (09/29/2017), 12 mg (10/28/2017), 12 mg (11/17/2017)  CARBOplatin (PARAPLATIN) 580.8 mg in sodium chloride (NS) 0.9 % 100 mL IVPB, 580.8 mg (114.2 % of original dose 508.8 mg), Intravenous, Once, 6 of 6 cycles  Dose modification:   (original dose 508.8 mg, Cycle 1)  Administration: 580.8 mg (07/21/2017), 528.6 mg (08/11/2017), 517.8 mg (09/05/2017), 529.8 mg (09/29/2017), 493.2 mg (10/28/2017), 498 mg (11/17/2017)  PACLItaxel (TAXOL) 286.98 mg in sodium chloride 0.9% NON-PVC (NS) 0.9 % 500 mL IVPB, 175 mg/m2 = 286.98 mg, Intravenous, Once, 6 of 6 cycles  Administration: 286.98 mg (07/21/2017), 285.24 mg (08/11/2017), 276.48 mg (09/05/2017), 278.28 mg (09/29/2017), 281.76 mg (10/28/2017), 283.5 mg (11/17/2017)          01/16/2018 Genetics    [employer] panel. Genetic testing is negative.     08/25/2018 Recurrence    Elevated CA-125 and imaging consistent with recurrent disease.       09/04/2018 - 06/07/2019 Chemotherapy    OP OVARIAN DOXORUBICIN LIPOSOMAL/CARBOPLATIN  DOXOrubicin Liposome 30 mg/m2 IV on day 1, CARBOplatin AUC 5 IV on day 1, every 28 days       08/20/2019 - 03/13/2020 Chemotherapy    niraparib     04/20/2020 - 05/26/2020 Chemotherapy    olaparib     05/15/2020 Recurrence    + ct in ER with liver and peritoneal metastasis. We were not aware of the scan until 06/05/20.       06/12/2020 -  Chemotherapy    OP CERVICAL PEMBROLIZUMAB  pembrolizumab 200 mg IV on day 1, every 21 days         Obstetric History: G1P1001    Past Medical History:  Past Medical History:   Diagnosis Date   ??? ADHD (attention deficit hyperactivity disorder)    ??? Anxiety    ??? At risk for falls    ??? Baker's cyst    ??? Fractures    ??? Hypersomnia    ??? Hypertension    ??? Injury of posterior cruciate ligament    ??? Insomnia    ??? Irritable colon 07/10/2010   ??? Joint pain    ??? Joint replaced    ??? Major depressive disorder    ??? OSA (obstructive sleep apnea)    ??? Osteoarthritis    ??? Osteoporosis    ??? Ovarian  cancer (CMS-HCC) 07/03/2017   ??? Personality disorder (CMS-HCC)     Borderline Personality Disorder   ??? PLMD (periodic limb movement disorder)    ??? Tear of meniscus of knee    ??? Urge incontinence 11/12/2012   ??? Vestibular dysfunction 09/16/2013       Past Surgical History:  Past Surgical History:   Procedure Laterality Date   ??? CESAREAN SECTION  1981   ??? FINGER FRACTURE SURGERY Right 10/2013    thumb   ??? FOOT SURGERY Bilateral 9/00, 01/14    hardware   ??? IR INSERT PORT AGE GREATER THAN 5 YRS  07/04/2017    IR INSERT PORT AGE GREATER THAN 5 YRS 07/04/2017 [name], MD IMG VIR [facility]   ??? PR BIL SALP-OOPH W/OMENTECT,TAH,RAD DISSECT Bilateral 07/01/2017    Procedure: BILATERAL SALPINGO-OOPHORECTOMY W/OMENTECTOMY, TOTAL ABD HYSTERECTOMY & RADICAL DISSECTION FOR DEBULKING;  Surgeon: Ermelinda Das, MD;  Location: MAIN OR [facility];  Service: Gynecology Oncology   ??? PR LAP,DIAGNOSTIC ABDOMEN N/A 07/01/2017    Procedure: Laparoscopy, Abdomen, Peritoneum, & Omentum, Diagnostic, W/Wo Collection Specimen(S) By Brushing Or Washing;  Surgeon: Ermelinda Das, MD;  Location: MAIN [facility];  Service: Gynecology Oncology   ??? 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 TONSILLECTOMY  1970   ??? WRIST FRACTURE SURGERY Right 1998    wrist       Allergies:  Codeine and Sulfa (sulfonamide antibiotics)    Medications:  Current Facility-Administered Medications   Medication Dose Route Frequency Provider Last Rate Last Admin   ??? acetaminophen (TYLENOL) tablet 650 mg  650 mg Oral Q4H PRN Saintclair Halsted, MD       ??? ARIPiprazole (ABILIFY) tablet 10 mg  10 mg Oral Daily Saintclair Halsted, MD   10 mg at 07/20/20 1334   ??? atorvastatin (LIPITOR) tablet 10 mg  10 mg Oral Nightly Saintclair Halsted, MD       ??? calcium carbonate (TUMS) chewable tablet 400 mg of elem calcium  400 mg of elem calcium Oral Daily PRN Saintclair Halsted, MD       ??? senna (SENOKOT) tablet 1 tablet  1 tablet Oral BID PRN Saintclair Halsted, MD And   ??? docusate sodium (COLACE) capsule 100 mg  100 mg Oral BID PRN Saintclair Halsted, MD       ??? heparin (porcine) 5,000 unit/mL injection 5,000 Units  5,000 Units Subcutaneous Q12H [facility] Saintclair Halsted, MD       ??? lactated ringers bolus 1,000 mL  1,000 mL Intravenous Once Saintclair Halsted, MD   1,000 mL at 07/20/20 1335   ??? lactated Ringers infusion  50 mL/hr Intravenous Continuous Saintclair Halsted, MD 50 mL/hr at 07/20/20 1335 50 mL/hr at 07/20/20 1335   ??? latanoprost (XALATAN) 0.005 % ophthalmic solution 1 drop  1 drop Both Eyes Nightly Saintclair Halsted, MD       ??? melatonin tablet 3 mg  3 mg Oral Nightly PRN Saintclair Halsted, MD       ??? metoclopramide (REGLAN) tablet 10 mg  10 mg Oral TID PRN Saintclair Halsted, MD       ??? mirtazapine (REMERON) tablet 7.5 mg  7.5 mg Oral Nightly Saintclair Halsted, MD       ??? pantoprazole (PROTONIX) EC tablet 20 mg  20 mg Oral Daily Saintclair Halsted, MD   20 mg at 07/20/20 1334   ??? polyethylene glycol (MIRALAX) packet 17 g  17 g Oral Daily PRN Saintclair Halsted, MD       ??? [  START ON 07/21/2020] venlafaxine (EFFEXOR-XR) 24 hr capsule 300 mg  300 mg Oral QAM Saintclair Halsted, MD           Past Social History:  Social History     Tobacco Use   ??? Smoking status: Never Smoker   ??? Smokeless tobacco: Never Used   Vaping Use   ??? Vaping Use: Never used   Substance Use Topics   ??? Alcohol use: Yes     Alcohol/week: 1.0 - 2.0 standard drink     Types: 1 - 2 Shots of liquor per week   ??? Drug use: No        Family History:  family history includes Arthritis in her father, mother, and sister; Cancer in her father; Depression in her son; Personality disorder in her sister.    Immunizations:   Immunization History   Administered Date(s) Administered   ??? COVID-19 VACC,MRNA,(PFIZER)(PF)(IM) 05/18/2019, 07/06/2019, 12/16/2019   ??? INFLUENZA INJ MDCK PF, QUAD,(FLUCELVAX)(60MO AND UP EGG FREE) 12/31/2019   ??? INFLUENZA TIV (TRI) PF (IM) 01/29/2011, 01/27/2012   ??? Influenza Vaccine Quad (IIV4 PF) 23mo+ injectable 03/10/2016, 01/20/2018, 12/17/2018   ??? Influenza Virus Vaccine, unspecified formulation 01/07/2017   ??? Influenza, High Dose (IIV4) 65 yrs & older 02/12/2013, 01/27/2014, 03/28/2015   ??? PNEUMOCOCCAL POLYSACCHARIDE 23 11/12/2012   ??? Pneumococcal Conjugate 13-Valent 02/12/2013   ??? TdaP 07/26/2012, 12/22/2019       Review of Systems:  A comprehensive review of 10 systems was negative except for pertinent positives noted in HPI.    PHYSICAL EXAM     Temp:  [36.3 ??C (97.3 ??F)-36.9 ??C (98.5 ??F)] 36.9 ??C (98.5 ??F)  Heart Rate:  [91-98] 91  Resp:  [12] 12  BP: (112-117)/(66-72) 112/72  SpO2:  [97 %-99 %] 99 %    -General:   Chronically ill, in NAD.  -CV:    RRR. No m/r/g. Audible S1 and S2.  -Pulm:   CTAB. Good air movement. Normal WOB.  -Abd:    Non-tender, non-distended abdomen. No guarding, no rebound. Hypoactive bowel sounds.   -Extremities:   Bilateral 2+ pitting edema to above the knee.   -Psych:   Appropriate.The patient is awake and alert.  -GU:    Deffered    LABS AND IMAGING     All lab results last 24 hours:    Recent Results (from the past 24 hour(s))   Comprehensive Metabolic Panel    Collection Time: 07/20/20  8:35 AM   Result Value Ref Range    Sodium 133 (L) 135 - 145 mmol/L    Potassium 3.7 3.4 - 4.8 mmol/L    Chloride 102 98 - 107 mmol/L    Anion Gap 17 (H) 5 - 14 mmol/L    CO2 14.4 (L) 20.0 - 31.0 mmol/L    BUN 56 (H) 9 - 23 mg/dL    Creatinine 2.72 (H) 0.60 - 0.80 mg/dL    BUN/Creatinine Ratio 22     EGFR CKD-EPI Non-African American, Female 18 (L) >=60 mL/min/1.65m2    EGFR CKD-EPI African American, Female 21 (L) >=60 mL/min/1.28m2    Glucose 119 70 - 179 mg/dL    Calcium 8.8 8.7 - 53.6 mg/dL    Albumin 3.4 3.4 - 5.0 g/dL    Total Protein 6.8 5.7 - 8.2 g/dL    Total Bilirubin 0.8 0.3 - 1.2 mg/dL    AST 63 (H) <=64 U/L    ALT 44 10 - 49 U/L    Alkaline Phosphatase  375 (H) 46 - 116 U/L   Magnesium Level    Collection Time: 07/20/20  8:35 AM   Result Value Ref Range    Magnesium 1.4 (L) 1.6 - 2.6 mg/dL   Creatinine Collection Time: 07/20/20  8:35 AM   Result Value Ref Range    Creatinine 2.55 (H) 0.60 - 0.80 mg/dL    EGFR CKD-EPI Non-African American, Female 18 (L) >=60 mL/min/1.72m2    EGFR CKD-EPI African American, Female 21 (L) >=60 mL/min/1.66m2   CBC w/ Differential    Collection Time: 07/20/20  8:35 AM   Result Value Ref Range    WBC 7.2 3.6 - 11.2 10*9/L    RBC 2.66 (L) 3.95 - 5.13 10*12/L    HGB 10.0 (L) 11.3 - 14.9 g/dL    HCT 16.1 (L) 09.6 - 44.0 %    MCV 111.8 (H) 77.6 - 95.7 fL    MCH 37.5 (H) 25.9 - 32.4 pg    MCHC 33.6 32.0 - 36.0 g/dL    RDW 04.5 (H) 40.9 - 15.2 %    MPV 8.3 6.8 - 10.7 fL    Platelet 223 150 - 450 10*9/L    nRBC 0 <=4 /100 WBCs    Neutrophils % 71.5 %    Lymphocytes % 10.4 %    Monocytes % 16.7 %    Eosinophils % 0.8 %    Basophils % 0.6 %    Absolute Neutrophils 5.1 1.8 - 7.8 10*9/L    Absolute Lymphocytes 0.7 (L) 1.1 - 3.6 10*9/L    Absolute Monocytes 1.2 (H) 0.3 - 0.8 10*9/L    Absolute Eosinophils 0.1 0.0 - 0.5 10*9/L    Absolute Basophils 0.0 0.0 - 0.1 10*9/L    Macrocytosis Slight (A) Not Present    Anisocytosis Slight (A) Not Present   Protein, total    Collection Time: 07/20/20  8:35 AM   Result Value Ref Range    Total Protein 6.8 5.7 - 8.2 g/dL   Comprehensive Metabolic Panel    Collection Time: 07/20/20  1:46 PM   Result Value Ref Range    Sodium 134 (L) 135 - 145 mmol/L    Potassium 4.2 3.4 - 4.8 mmol/L    Chloride 101 98 - 107 mmol/L    Anion Gap 15 (H) 5 - 14 mmol/L    CO2 18.0 (L) 20.0 - 31.0 mmol/L    BUN 60 (H) 9 - 23 mg/dL    Creatinine 8.11 (H) 0.60 - 0.80 mg/dL    BUN/Creatinine Ratio 24     EGFR CKD-EPI Non-African American, Female 18 (L) >=60 mL/min/1.50m2    EGFR CKD-EPI African American, Female 21 (L) >=60 mL/min/1.87m2    Glucose 101 70 - 179 mg/dL    Calcium 8.6 (L) 8.7 - 10.4 mg/dL    Albumin 3.0 (L) 3.4 - 5.0 g/dL    Total Protein 6.0 5.7 - 8.2 g/dL    Total Bilirubin 0.7 0.3 - 1.2 mg/dL    AST 60 (H) <=91 U/L    ALT 42 10 - 49 U/L    Alkaline Phosphatase 350 (H) 46 - 116 U/L   CBC    Collection Time: 07/20/20  1:46 PM   Result Value Ref Range    WBC 6.7 3.6 - 11.2 10*9/L    RBC 2.37 (L) 3.95 - 5.13 10*12/L    HGB 9.2 (L) 11.3 - 14.9 g/dL    HCT 47.8 (L) 29.5 - 44.0 %    MCV 107.5 (H)  77.6 - 95.7 fL    MCH 38.7 (H) 25.9 - 32.4 pg    MCHC 36.0 32.0 - 36.0 g/dL    RDW 16.1 (H) 09.6 - 15.2 %    MPV 7.8 6.8 - 10.7 fL    Platelet 184 150 - 450 10*9/L   Lactate, Venous, Whole Blood    Collection Time: 07/20/20  1:46 PM   Result Value Ref Range    Lactate, Venous 2.1 (H) 0.5 - 1.8 mmol/L   Magnesium Level    Collection Time: 07/20/20  1:46 PM   Result Value Ref Range    Magnesium 1.3 (L) 1.6 - 2.6 mg/dL   Phosphorus Level    Collection Time: 07/20/20  1:46 PM   Result Value Ref Range    Phosphorus 5.9 (H) 2.4 - 5.1 mg/dL     No results found.  Radiology studies were personally reviewed

## 2020-07-20 NOTE — Unmapped (Signed)
Patient was here on 07/17/20 with Dawn, medication RX was denied 07/19/20      Deniedon April 6  Request Reference Number: [id number]. [employer] is denied for not meeting the prior authorization requirement(s). Details of this decision are in the notice attached below or have been faxed to you. Appeals are not supported through ePA. Please refer to the fax case notice for appeals information and instructions.

## 2020-07-20 NOTE — Unmapped (Signed)
Pt. contacted and voiced understanding of the message.

## 2020-07-20 NOTE — Unmapped (Signed)
Pt seen by PP. I have sent in new Rx for her thrush. Please let pt know. Thanks, DR

## 2020-07-21 LAB — URINALYSIS
BILIRUBIN UA: NEGATIVE
BLOOD UA: NEGATIVE
GLUCOSE UA: NEGATIVE
KETONES UA: NEGATIVE
LEUKOCYTE ESTERASE UA: NEGATIVE
NITRITE UA: NEGATIVE
PH UA: 5 (ref 5.0–9.0)
PROTEIN UA: NEGATIVE
RBC UA: <1 /HPF (ref <=4)
SPECIFIC GRAVITY UA: 1.014 (ref 1.003–1.030)
SQUAMOUS EPITHELIAL: <1 /HPF (ref 0–5)
UROBILINOGEN UA: 0.2
WBC UA: 7 /HPF — ABNORMAL HIGH (ref 0–5)

## 2020-07-21 LAB — CBC
HEMATOCRIT: 24.8 % — ABNORMAL LOW (ref 34.0–44.0)
HEMOGLOBIN: 8.4 g/dL — ABNORMAL LOW (ref 11.3–14.9)
MEAN CORPUSCULAR HEMOGLOBIN CONC: 33.8 g/dL (ref 32.0–36.0)
MEAN CORPUSCULAR HEMOGLOBIN: 36.9 pg — ABNORMAL HIGH (ref 25.9–32.4)
MEAN CORPUSCULAR VOLUME: 109.2 fL — ABNORMAL HIGH (ref 77.6–95.7)
MEAN PLATELET VOLUME: 7.8 fL (ref 6.8–10.7)
PLATELET COUNT: 156 10*9/L (ref 150–450)
RED BLOOD CELL COUNT: 2.28 10*12/L — ABNORMAL LOW (ref 3.95–5.13)
RED CELL DISTRIBUTION WIDTH: 16.8 % — ABNORMAL HIGH (ref 12.2–15.2)
WBC ADJUSTED: 6.2 10*9/L (ref 3.6–11.2)

## 2020-07-21 LAB — MAGNESIUM: MAGNESIUM: 2.2 mg/dL (ref 1.6–2.6)

## 2020-07-21 LAB — LACTATE, VENOUS, WHOLE BLOOD
LACTATE BLOOD VENOUS: 2.4 mmol/L — ABNORMAL HIGH (ref 0.5–1.8)
LACTATE BLOOD VENOUS: 2.3 mmol/L — ABNORMAL HIGH (ref 0.5–1.8)

## 2020-07-21 LAB — BASIC METABOLIC PANEL
ANION GAP: 9 mmol/L (ref 5–14)
BLOOD UREA NITROGEN: 48 mg/dL — ABNORMAL HIGH (ref 9–23)
BUN / CREAT RATIO: 23
CALCIUM: 8.2 mg/dL — ABNORMAL LOW (ref 8.7–10.4)
CHLORIDE: 105 mmol/L (ref 98–107)
CO2: 20 mmol/L (ref 20.0–31.0)
CREATININE: 2.1 mg/dL — ABNORMAL HIGH
EGFR CKD-EPI AA FEMALE: 26 mL/min/{1.73_m2} — ABNORMAL LOW (ref >=60)
EGFR CKD-EPI NON-AA FEMALE: 23 mL/min/{1.73_m2} — ABNORMAL LOW (ref >=60)
GLUCOSE RANDOM: 89 mg/dL (ref 70–179)
POTASSIUM: 3.6 mmol/L (ref 3.4–4.8)
SODIUM: 134 mmol/L — ABNORMAL LOW (ref 135–145)

## 2020-07-21 LAB — PHOSPHORUS: PHOSPHORUS: 4.3 mg/dL (ref 2.4–5.1)

## 2020-07-21 LAB — VITAMIN B12: VITAMIN B-12: 1532 pg/mL — ABNORMAL HIGH (ref 211–911)

## 2020-07-21 LAB — FOLATE: FOLATE: 20.9 ng/mL (ref >=5.4)

## 2020-07-21 LAB — AMMONIA: AMMONIA: 58 umol/L — ABNORMAL HIGH (ref 11–32)

## 2020-07-21 LAB — HOMOCYSTEINE: HOMOCYSTEINE PLASMA: 12.7 umol/L (ref 3.7–13.9)

## 2020-07-21 LAB — TSH: THYROID STIMULATING HORMONE: 0.799 u[IU]/mL (ref 0.550–4.780)

## 2020-07-21 MED ADMIN — heparin (porcine) 5,000 unit/mL injection 5,000 Units: 5000 [IU] | SUBCUTANEOUS | @ 13:00:00

## 2020-07-21 MED ADMIN — sodium chloride 0.9% (NS) bolus 500 mL: 500 mL | INTRAVENOUS | @ 21:00:00 | Stop: 2020-07-21

## 2020-07-21 MED ADMIN — sodium chloride 0.9% (NS) bolus 1,000 mL: 1000 mL | INTRAVENOUS | @ 15:00:00 | Stop: 2020-07-21

## 2020-07-21 MED ADMIN — mirtazapine (REMERON) tablet 7.5 mg: 7.5 mg | ORAL | @ 02:00:00

## 2020-07-21 MED ADMIN — heparin (porcine) 5,000 unit/mL injection 5,000 Units: 5000 [IU] | SUBCUTANEOUS | @ 02:00:00

## 2020-07-21 MED ADMIN — venlafaxine (EFFEXOR-XR) 24 hr capsule 300 mg: 300 mg | ORAL | @ 13:00:00

## 2020-07-21 MED ADMIN — latanoprost (XALATAN) 0.005 % ophthalmic solution 1 drop: 1 [drp] | OPHTHALMIC | @ 02:00:00

## 2020-07-21 MED ADMIN — ARIPiprazole (ABILIFY) tablet 10 mg: 10 mg | ORAL | @ 13:00:00

## 2020-07-21 MED ADMIN — pantoprazole (PROTONIX) EC tablet 20 mg: 20 mg | ORAL | @ 13:00:00

## 2020-07-21 MED ADMIN — atorvastatin (LIPITOR) tablet 10 mg: 10 mg | ORAL | @ 02:00:00

## 2020-07-21 MED ADMIN — hydrocortisone 1 % cream: TOPICAL | @ 15:00:00

## 2020-07-21 NOTE — Unmapped (Signed)
GYN ONC PROGRESS NOTE    Assessment and Plan:  Lisa Burton is a 75 y.o. 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  - 09/04/18-06/07/19: doxil/carboplatin; 08/20/19-03/13/20: niraparib; 04/20/20-05/26/20: olaparib   - 05/15/20: CT with innumerable hypoattenuating lesions throughout liver, chronic fracture deformity of proximal L superior pubic ramus, enlarging/new peritoneal implants abutting small bowel mesentery measuring 1.3 and 0.9cm   - 06/12/20: cyclophosphamide/pembrolizumab/bevacizumab   - 07/06/20: CT c/a/p with 5.9 x 5.3 x 4.8cm heterogenously enhancing pelvic mass, slightly hypoattenuating peripherally enhancing 3.0 x 2.8 x 2.9cm mass abutting vaginal cuff. ??Moderate L hydroureteronephrosis (similar to prior). Lower abdominal omental implants, enlarged from prior. L pelvic side wall LN + L external iliac node, both enlarged. Innumerable hepatic lesions with central necrosis, increased in size. Extrinsic compression of hepatic IVC + hepatic veins, intrahepatic IVC significantly stenotic c/f possible infiltration. Trace ascites   - Last Ca-125 (07/07/20): 100     Diarrhea c/f pembro colitis, abdominal pain  - 3/21 Last chemo, worsening symptoms since then, briefly improved during last admission w/ administration of prednisone, but acutely worsened a day or two after returning home  - Admit: Afebrile, WBC 7.2 > 6.2, Lactate 2.1 > repeat pending this AM  - No BM since admission, GIPP ordered if BM occurs    FTT  - Inability to PO intake d/t decreased appetite/desire. Reports being hungry this AM.  - Cont home meds: Zofran/Reglan PRN, omeprazole, Remeron    - Albumin 3.0  - Nutrition c/s  - UA ordered    AKI on CKD -??Hypomagnesemia   - Baseline range from Cr 1.5-1.7 > 2.17 (4/6) > 2.55 (4/7) > 2.10 this AM  - Medlocked  - avoid nephrotoxins   - Mag 1.4 > repletion > 2.2  - Replete lytes PRN   - BMP: AST/ALT 60/42 (4/7), Alk phos 350    Tremor, bilateral  - Ammonia pending  - Plan neurology consult, appreciate recommendations    Anemia  - Hgb 9.2 > 8.4 (4/7)  - Continue PPI    Cancer related pain   - Current: Tyl, oxy PRN     OAB ??   - Oxybutynin 5mg  QID > self d/c'd   ??   Osteoporosis ??   - Hx chronic pubic ramus fracture ??  ??  HTN - HLD ??  - Atorvastatin 10mg  daily  - Off lisinopril for months  ??  ADHD/MDD/Borderline PD  - Abilify 10mg  daily   - Effexor 300mg  daily  - Plan psych consult today given concern for component of depression related to frequent hospital presentations, reported failure to eat at home with adequate intake in hospital. Appreciate recommendations.    PPX:  - Bowel regimen and antiemetics prn  - SCDs, heparin, ambulation    Dispo: Floor    Plan discussed with Dr. Jonny Ruiz. Attending Dr. Reyne Dumas was immediately available.     Subjective:   Patient reports itching her arms. She reports no bowel movement in last couple of days. She is hungry this morning. Denies pain.  Objective:  Temp:  [36.3 ??C (97.3 ??F)-36.9 ??C (98.5 ??F)] 36.8 ??C (98.2 ??F)  Heart Rate:  [81-98] 81  Resp:  [12-15] 15  BP: (112-117)/(66-99) 113/69  SpO2:  [97 %-99 %] 99 %    Gen: NAD, awake, alert, oriented  CV: regular rate and rhythm   Pulm: clear to auscultation bilaterally, normal work of breathing  Abd: Soft, appropriately tender to palpation, non-distended, bowel sounds present  GU: deferred    Medications (scheduled)   ??? ARIPiprazole  10 mg Oral Daily   ??? atorvastatin  10 mg Oral Nightly   ??? heparin (porcine) for subcutaneous use  5,000 Units Subcutaneous Q12H [facility]   ??? latanoprost  1 drop Both Eyes Nightly   ??? mirtazapine  7.5 mg Oral Nightly   ??? pantoprazole  20 mg Oral Daily   ??? venlafaxine  300 mg Oral QAM       Medications (prn)  acetaminophen, calcium carbonate, hydrocortisone, melatonin, metoclopramide    Labs:   Lab Results   Component Value Date    WBC 6.2 07/21/2020    HGB 8.4 (L) 07/21/2020    HCT 24.8 (L) 07/21/2020    PLT 156 07/21/2020       Lab Results   Component Value Date    NA 134 (L) 07/21/2020    K 3.6 07/21/2020    CL 105 07/21/2020    CO2 20.0 07/21/2020    BUN 48 (H) 07/21/2020    CREATININE 2.10 (H) 07/21/2020    GLU 89 07/21/2020    CALCIUM 8.2 (L) 07/21/2020    MG 2.2 07/21/2020    PHOS 4.3 07/21/2020       Lab Results   Component Value Date    BILITOT 0.7 07/20/2020    BILIDIR <0.10 04/03/2020    PROT 6.0 07/20/2020    ALBUMIN 3.0 (L) 07/20/2020    ALT 42 07/20/2020    AST 60 (H) 07/20/2020    ALKPHOS 350 (H) 07/20/2020    GGT 15 04/27/2010       Lab Results   Component Value Date    INR 1.0 01/16/2011    APTT 30.2 01/16/2011       Scribe's Attestation: Georgana Curio, MD obtained and performed the history, physical exam and medical decision making elements that were  entered into the chart. Documentation assistance was provided by me personally, a scribe. Signed by Ronnald Ramp Scribe, on July 21, 2020 at 5:49 AM.         ----------------------------------------------------------------------------------------------------------------------  July 21, 2020 7:27 AM. Documentation assistance provided by the Scribe. I was present during the time the encounter was recorded. The information recorded by the Scribe was done at my direction and has been reviewed and validated by me.  ----------------------------------------------------------------------------------------------------------------------

## 2020-07-21 NOTE — Unmapped (Signed)
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  ??            General  Care Manager assessed the patient by : In person interview with patient, Medical record review, Discussion with Clinical Care team  Orientation Level: Oriented X4  Functional level prior to admission: Independent  Reason for referral: Discharge Planning    Contact/Decision Maker  Extended Emergency Contact Information  Primary Emergency Contact: Ashley Murrain  Address: [address]           [place], [place] [address] [place]  Home Phone: [phone]  Relation: Spouse    Legal Next of Kin / Guardian / POA / Advance Directives     HCDM (patient stated preference): Ashley Murrain - Spouse - [phone]    Advance Directive (Medical Treatment)  Does patient have an advance directive covering medical treatment?: Patient does not have advance directive covering medical treatment.  Reason patient does not have an advance directive covering medical treatment:: Patient does not wish to complete one at this time.    Health Care Decision Maker [HCDM] (Medical & Mental Health Treatment)  Healthcare Decision Maker: HCDM documented in the HCDM/Contact Info section.  Information offered on HCDM, Medical & Mental Health advance directives:: Patient given information.    Advance Directive (Mental Health Treatment)  Does patient have an advance directive covering mental health treatment?: Patient does not have advance directive covering mental health treatment.  Reason patient does not have an advance directive covering mental health treatment:: Patient does not wish to complete one at this time.    Patient Information  Lives with: Spouse/significant other    Type of Residence: Private residence        Location/Detail: [address], [place] [address]    Support Systems/Concerns: Spouse, Children, Family Members, Friends/Neighbors; pt's son and his family live in [place]    Responsibilities/Dependents at home?: No    Home Care services in place prior to admission?: No (Pt has someone who cleans once a week, someone else who cooks and leaves meals once a week (some of which can be frozen) and a friend who comes over periodically to empty the dishwasher.)            Equipment Currently Used at Home: cane, quad, Gelena Klosinski, rolling       Currently receiving outpatient dialysis?: No       Financial Information       Need for financial assistance?: No       Social Determinants of Health  Social Determinants of Health were addressed in provider documentation.  Please refer to patient history.    Discharge Needs Assessment  Concerns to be Addressed: discharge planning    Clinical Risk Factors: > 65, Principal Diagnosis: Cancer, Stroke, COPD, Heart Failure, AMI, Pneumonia, Joint Replacment, Multiple Diagnoses (Chronic), Functional Limitations, History of Falls, Readmission < 30 Days    Barriers to taking medications: No    Prior overnight hospital stay or ED visit in last 90 days: Yes    Readmission Within the Last 30 Days: previous discharge plan unsuccessful         Anticipated Changes Related to Illness: none    Equipment Needed After Discharge: other (see comments) (rollator)    Discharge Facility/Level of Care Needs: other (see comments) (home)  Readmission  Risk of Unplanned Readmission Score: UNPLANNED READMISSION SCORE: 23%  Predictive Model Details          23% (High)  Factor Value    Calculated 07/21/2020 12:04 19% Number of active Rx orders 32    Welling Risk of Unplanned Readmission Model 10% Number of ED visits in last six months 2     9% Diagnosis of cancer present     8% Active antipsychotic Rx order present     8% Latest calcium low (8.2 mg/dL)     7% Latest BUN high (48 mg/dL)     6% Imaging order present in last 6 months     5% Latest hemoglobin low (8.4 g/dL)     5% Phosphorous result present     5% Age 33     4% Active anticoagulant Rx order present     4% Active corticosteroid Rx order present     4% Latest creatinine high (2.10 mg/dL)     2% Charlson Comorbidity Index 2     2% Future appointment scheduled     1% Current length of stay 1.019 days     1% Active ulcer medication Rx order present      Readmitted Within the Last 30 Days? (No if blank)        Discharge Plan  Screen findings are: Discharge planning needs identified or anticipated (Comment). (CM spoke with pt about PT's recommendation for post acute PT 3x weekly and a rollator. Pt is amenable to home health PT and rollator. Pt was given a list of home health agencies from which to choose and she would like to use [facility]. Referral made to [facility] for RN, PT and OT. Rollator ordered through [facility] for delivery to pt's room prior to discharge.)    Expected Discharge Date: 07/22/20    Expected Transfer from Critical Care:         Patient and/or family were provided with choice of facilities / services that are available and appropriate to meet post hospital care needs?: Yes   List choices in order highest to lowest preferred, if applicable. : [facility]    Initial Assessment complete?: Yes

## 2020-07-21 NOTE — Unmapped (Signed)
[facility]  Initial Psychiatry Consult Note     Service Date: July 21, 2020  LOS:  LOS: 1 day      Assessment:   Lisa Burton is a 75 y.o. 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  Patient was seen in consultation by Psychiatry at the request of Verlin Dike, MD with Gynecology (GYN) for evaluation of Depression.     The patient's current presentation of variable appetite and sleep, subjective > objective impairments in concentration and memory, anxiety related to her worsening medical condition (recurrence of fallopian tube cancer in January), with absence of suicide ideation, anhedonia, or persistent low mood is most consistent with adjustment disorder with anxiety. She reports that her decreased PO intake is related to feeling satiated, but denies decreased interest or pleasure in food generally. As such, her variable appetite is more likely attributable to medical factors such as abdominal metastatic disease and compression of abdominal vasculature than to active psychopathology. While she has historical diagnoses of major depressive disorder and borderline personality disorder, these conditions are well-compensated at present. She reports existential concerns surrounding prognostic uncertainty and mortality, but her emphasis on these themes does not seem to be out of proportion to the seriousness of her medical condition.  Medical and psychiatric factors contributing to medical decision-making include recent bloodwork notable for mild hyponatremia, AOCKD, anemia with prominent macrocytosis, acidosis with elevated anion gap, lactate 2.4, hypoalbuminemia, mild persistent AST elevation and alk phos ~3x ULN. 3/24 CT scans of chest, abd, and pelvis demonstrated enhancing pelvic mass, thickening of rectosigmoid colon, extensive hepatic lesions suggestive of metastasis, new ascites, obstructive uropathy, evidence of compression of the major abdominal veins. CT head on 3/24 demonstrates global cerebral atrophy, nonspecific scattered hypodensities suggestive of microvascular dz. No recent ECG available; ecg in July 2021 demonstrated NSR, unremarkable QTC. Mirtazapine recently started by primary team    Please see below for detailed recommendations.    Diagnoses:   Active Hospital problems:  Principal Problem:    Failure to thrive in adult  Active Problems:    Adjustment disorder with anxiety       Problems edited/added by me:  Problem   Adjustment Disorder With Anxiety       Safety Risk Assessment:  A suicide and violence risk assessment was performed as part of this evaluation. Risk factors for self-harm/suicide: history of depression, recent onset of serious medical condition and borderline personality disorder.  Protective factors against self-harm/suicide:  lack of active SI, no history of previous suicide attempts , motivation for treatment, currently receiving mental health treatment, has access to clinical interventions and support, utilization of positive coping skills, supportive family, sense of responsibility to family and social supports, presence of a significant relationship, presence of an available support system, enjoyment of leisure actvities, current treatment compliance and safe housing.  Risk factors for harm to others: N/A.  Protective factors against harm to others: no known history of violence towards others, no active symptoms of psychosis, no active symptoms of mania, no previous acts of violence in current setting, intolerant attititude toward deviance, high intellectual functioning, positive social orientation, religiosity and connectedness to family.  While future psychiatric events cannot be accurately predicted, the patient is not currently at elevated acute risk, and is  at elevated chronic risk of harm to self and is not currently at elevated acute risk, and is not at elevated chronic risk of harm to others.       Recommendations:   ## Safety:   -- No acute safety concerns.  Please see safety assessment for further discussion.    ## Medications:   -- continue home aripiprazole 10 mg daily and venlafaxine ER 300 mg daily  -- agree with mirtazapine 7.5 mg nightly per primary team  -- START Thiamine PO/NGT 100 mg daily.  -- START Folate supplementation and multivitamin daily.    ## Medical Decision Making Capacity:   -- A formal capacity assessement was not performed as a part of this evaluation.  If specific capacity questions arise, please contact our team as below.     ## Further Work-up:   -- Continue to work-up and treat possible medical conditions that may be contributing to current presentation.     ## Disposition:   -- There are no psychiatric contraindications to discharging this patient when medically appropriate.  -- The patient will follow-up with Dr. Dallas Schimke (outpatient psychiatrist) for mental health care at the time of discharge.  -- She is interested in cancer-specific psychotherapy/counseling; we are working with her to determine whether she may be appropriate for [facility] clinics    ## Behavioral / Environmental:   -- Utilize compassion and acknowledge the patient's experiences while setting clear and realistic expectations for care.   -- To minimize splitting of staff, assign one staff person (likely primary resident) to communicate all information from the team when feasible.    Thank you for this consult request. Recommendations have been communicated to the primary team.  We will follow as needed at this time. Please page [phone] for any questions or concerns.     This patient was evaluated in person.    Discussed with and seen by Fellow, Johny Sax, MD.  Discussed with and seen by Attending, Voncille Lo, MD, who agrees with the assessment and plan.    Soledad Gerlach, MD    I saw and evaluated the patient, participating in the key portions of the service.  I reviewed the resident???s note.  I agree with the resident???s findings and plan.     Hubert Azure, MD     History:   Relevant Aspects of Hospital Course:   Admitted on 07/20/2020 for report GI distress/diarrhea concerning for colitis, and report of poor PO intake/failure to thrive. Medical workup is ongoing, but primary team is concerned for psychiatric contribution to presenting complaints as she has not exhibited diarrhea nor poor PO intake in the hospital.    Patient Report:   Pt notes that she has been seeing Dr. Dallas Schimke for psychiatry for many years and that she is very comfortable talking about her psychiatric history. She notes she has struggled with depression for most of her life. She has also been diagnosed with ADHD but is not currently taking any stimulants or other ADHD-specific treatments. She feels her mood has been pretty good, all things considered, and feels her depression has responded well to her long-time outpatient regimen. She feels that she has overall been quite stable from a psychiatric perspective. She notes that she has been fearful in the setting of recurrence of the cancer earlier this year as she does not know what upcoming chemo treatments will look like, does not know what to expect with regard to prognosis or life expectancy, and has been doing her  best to get her affairs in order. She notes that her husband is 91 years old and his health is not good, as such, they are not sure whether she will survive him or whether he will survive her, and they have been having frank discussions about these contingencies. She endorses low energy, poor concentration (which she attributes to brain fog due to cancer), variable appetite and sleep. She denies decreased interest in food, stating that she simply often feels full, as though she has just had a large, satisfying meal, even when she had not eaten recently, but notes that at other times she can eat ravenously and sometimes craves specific foods, which are generally satisfying.  She denies persistently low mood, anhedonia, guilt, suicide ideation, or psychomotor changes. She endorses history of vague suicide ideation but no history of planning, intent, or suicidal behavior. She denies lifetime history of AVH. She denies paranoia and does not volunteer any delusional content. She endorses a vague sense over the past few weeks that someone else is in the room with her, but does not find this particularly distressing or bothersome. She endorses distress at the swelling in her legs but denies other significant somatic concerns or medication adverse effects. She is not interested in making additional changes to her psychotropic regimen at this time, noting that she plans to follow up very closely with her longtime outpatient psychiatrist, Dr. Dallas Schimke. She has been working with a therapist for the past 12 years, but recently terminated treatment due to dissatisfaction with tele-visits in the COVID19 pandemic. She states that she has been doing fine without this and does not miss it. Moreover, she states that she is not interested in establishing with any other therapist, but would be willing to return to her previous therapist if he becomes available for in-person visits again. With further discussion, however, she opines that she might benefit from additional support, particularly if available at [facility] and in coordination with her oncology appointments. She would be open to speaking with the inpatient service as well if she will be hospitalized for more than a few days.    ROS:   All systems reviewed as negative/unremarkable aside from the following pertinent positives and negatives: endorses improving leg swelling    Collateral information:   - Reviewed medical records in Epic  - Reviewed medical records via CareEverywhere    Psychiatric History:   Information collected from patient interview and chart review.  Prior psychiatric diagnoses: MDD, anxiety, ADHD, BPD  Psychiatric hospitalizations: no known  Substance abuse treatment: none  Suicide attempts / Non-suicidal self-injury: none  Medication trials/compliance: bupropion, venlafaxine, zolpidem, paroxetine, temazepam, diazepam, gabapentin  Current OP psychiatric medication regimen: as above  Current psychiatrist: Dr[facility] at [facility]  Current therapist: Saw Steele Sizer in [place] for many years, currently none  Family psychiatric history unknown    Medical History:  Past Medical History:   Diagnosis Date    ADHD (attention deficit hyperactivity disorder)     Anxiety     At risk for falls     Baker's cyst     Fractures     Hypersomnia     Hypertension     Injury of posterior cruciate ligament     Insomnia     Irritable colon 07/10/2010    Joint pain     Joint replaced     Major depressive disorder     OSA (obstructive sleep apnea)     Osteoarthritis     Osteoporosis  Ovarian cancer (CMS-HCC) 07/03/2017    Personality disorder (CMS-HCC)     Borderline Personality Disorder    PLMD (periodic limb movement disorder)     Tear of meniscus of knee     Urge incontinence 11/12/2012    Vestibular dysfunction 09/16/2013       Surgical History:  Past Surgical History:   Procedure Laterality Date    CESAREAN SECTION  1981    FINGER FRACTURE SURGERY Right 10/2013    thumb    FOOT SURGERY Bilateral 9/00, 01/14    hardware    IR INSERT PORT AGE GREATER THAN 5 YRS  07/04/2017    IR INSERT PORT AGE GREATER THAN 5 YRS 07/04/2017 [name], MD IMG VIR [facility]    PR BIL SALP-OOPH W/OMENTECT,TAH,RAD DISSECT Bilateral 07/01/2017    Procedure: BILATERAL SALPINGO-OOPHORECTOMY W/OMENTECTOMY, TOTAL ABD HYSTERECTOMY & RADICAL DISSECTION FOR DEBULKING;  Surgeon: Ermelinda Das, MD;  Location: MAIN OR [facility];  Service: Gynecology Oncology    PR LAP,DIAGNOSTIC ABDOMEN N/A 07/01/2017 Procedure: Laparoscopy, Abdomen, Peritoneum, & Omentum, Diagnostic, W/Wo Collection Specimen(S) By Brushing Or Washing;  Surgeon: Ermelinda Das, MD;  Location: MAIN OR [facility];  Service: Gynecology Oncology    REPLACEMENT TOTAL KNEE Bilateral 08/27/2004    TONSILLECTOMY  1970    WRIST FRACTURE SURGERY Right 1998    wrist       Medications:     Current Facility-Administered Medications:     acetaminophen (TYLENOL) tablet 650 mg, 650 mg, Oral, Q4H PRN, Saintclair Halsted, MD    ARIPiprazole (ABILIFY) tablet 10 mg, 10 mg, Oral, Daily, Saintclair Halsted, MD, 10 mg at 07/21/20 0848    atorvastatin (LIPITOR) tablet 10 mg, 10 mg, Oral, Nightly, Saintclair Halsted, MD, 10 mg at 07/20/20 2133    calcium carbonate (TUMS) chewable tablet 400 mg of elem calcium, 400 mg of elem calcium, Oral, Daily PRN, Saintclair Halsted, MD    heparin (porcine) 5,000 unit/mL injection 5,000 Units, 5,000 Units, Subcutaneous, Q12H SCH, Saintclair Halsted, MD, 5,000 Units at 07/21/20 0848    hydrocortisone 1 % cream, , Topical, BID PRN, Saintclair Halsted, MD, Given at 07/21/20 1039    latanoprost (XALATAN) 0.005 % ophthalmic solution 1 drop, 1 drop, Both Eyes, Nightly, Saintclair Halsted, MD, 1 drop at 07/20/20 2134    melatonin tablet 3 mg, 3 mg, Oral, Nightly PRN, Saintclair Halsted, MD    metoclopramide (REGLAN) tablet 10 mg, 10 mg, Oral, TID PRN, Saintclair Halsted, MD    mirtazapine (REMERON) tablet 7.5 mg, 7.5 mg, Oral, Nightly, Saintclair Halsted, MD, 7.5 mg at 07/20/20 2133    pantoprazole (PROTONIX) EC tablet 20 mg, 20 mg, Oral, Daily, Saintclair Halsted, MD, 20 mg at 07/21/20 0848    sodium chloride 0.9% (NS) bolus 1,000 mL, 1,000 mL, Intravenous, Once, Donaciano Eva, MD, Last Rate: 500 mL/hr at 07/21/20 1045, 1,000 mL at 07/21/20 1045    venlafaxine (EFFEXOR-XR) 24 hr capsule 300 mg, 300 mg, Oral, QAM, Saintclair Halsted, MD, 300 mg at 07/21/20 0848    Allergies:  Allergies   Allergen Reactions    Codeine Rash    Sulfa (Sulfonamide Antibiotics) Rash and Nausea Only Social History:   Living situation: the patient lives with their spouse.  Relationship Status: Married   Children: Yes; son, grandchildren    Tobacco use: Denies current use  Alcohol use: Rare use; craved beer with ice the other day and had 2 beers but prior to that had not had a drink in approx 6 months  Drug use: Denies    Family History: Reviewed and updated  The patient's family history includes Arthritis in her father, mother, and sister; Cancer in her father; Depression in her son; Personality disorder in her sister.      Objective:   Vital signs:   Temp:  [36.3 ??C-36.9 ??C] 36.3 ??C  Heart Rate:  [81-91] 81  Resp:  [12-16] 16  BP: (96-114)/(69-99) 96/86  MAP (mmHg):  [82-104] 91  SpO2:  [96 %-99 %] 96 %  BMI (Calculated):  [23.63] 23.63    Physical Exam:  Gen: No acute distress.  Pulm: Normal work of breathing.  Neuro/MSK: Normal bulk/tone. Gait/station deferred.  Skin: pale.    Mental Status Exam:  Appearance:  appears stated age, well-nourished, clean/Neat and sitting in bed   Attitude:   calm, cooperative and engaged   Behavior/Psychomotor:  appropriate eye contact, fidgety and restless   Speech/Language:   normal rate, volume, tone, fluency and language intact, well formed. Mildly pressured   Mood:  ???pretty good??? fearful   Affect:  anxious, full and mood congruent   Thought process:  circumstantial-to-tangential, easily redirectable   Thought content:    denies thoughts of self-harm. Denies SI, plans, or intent. Denies HI.  No grandiose, self-referential, persecutory, or paranoid delusions noted.   Perceptual disturbances:   denies auditory and visual hallucinations and behavior not concerning for response to internal stimuli   Attention:  Alert   Concentration:  Able to recite days of week backwards accurately. One omission error on months of year backwards; confounded by repeated overhead announcements   Orientation:  Oriented to person, place, and situation. References whiteboard in room for date.   Memory:  3/3 items registered, 2/3 recalled at brief interval. Intact to details of recent and remote events   Fund of knowledge:   Abstraction intact   Insight:    Intact   Judgment:   Intact   Impulse Control:  Intact       Data Reviewed:  I reviewed labs from the last 24 hours.  I reviewed imaging reports from the last 24 hours.     Additional Psychometric Testing:  Not applicable.

## 2020-07-21 NOTE — Unmapped (Addendum)
Lisa Burton is a 75 y.o. 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 Her hospital course is outlined by the pertinent problems below:     ONC: Stage IIIC HG Carcinoma of Fallopian Tube   The patient is followed by her primary oncologist, Dr. Duard Brady. Diagnosis of stage IIIC HG Carcinoma of fallopian tube was on 07/01/17, after which she received chemotherapy from 07/18/17-11/17/17 with carboplatin/paclitaxel. Recurrent disease was detected on 08/25/18, after which she received additional chemotherapy, most recently on 06/12/20 with cyclophosphamide, pembrolizumab, and bevacizumab. Most recent imaging was a CT chest, abdomen, pelvis, notable for a pelvic mass, mass abutting the vaginal cuff, hydroureteronephrosis, and innumerable hepatic lesions with central necrosis. Her last Ca-125 was 100 on 07/07/20.     Diarrhea, Concerning for Pembro Colitis, Abdominal Pain   The patient had worsening diarrhea from her last chemotherapy session, which improved with prednisnone. However, her symptoms worsened within a day or two of returning home. On admission, she was afebrile and her white blood count was 7.2, and lactate 2.1. She remained afebrile throughout admission. She had no additional diarrhea during her admission.      Failure to Thrive   The patient had notably low food intake and decreased appetite prior to admission. She continued taking her home medications of zofran/reglan as needed in addition to omeprazole and remeron. Her albumin on admission was 3.0. Nutrition and palliative care were following as needed. She tolerated a full regular diet throughout her admission.     Cancer Related Pain   The patient took tylenol and oxycontin as needed.     Acute Kidney Injury on Chronic Kidney Disease - Hypomagnesemia   The patient's creatinine level was 2.17 on admission, which was elevated from her baseline of 1.5-1.7. Creatinine levels continued to downtrend appropriately towards her baseline as her PO intake improved. Electrolyte derangements were corrected as indicated.     Tremor, bilateral  Patient had complaints of tremor present when moving or doing fine motor tasks, which started a few months ago. Neurology was consulted and completed workup labs: ammonia was elevated to 58 and B12, folate, homocystine and TSH were normal. B1 and MMA were pending at time of discharge. Neurology deemed no treatment at this time with plan for outpatient follow up with [facility] if additional labs returned abnormal.     Hypertension - Hyperlipidemia   The patient continued to take atorvastatin, and has not taken lisinopril for months.     On HD#3/(07/22/20) she was deemed stable from a Gyn Onc standpoint. She was in good pain control on regimen at discharge, ambulating without difficulty, voiding spontaneously, and tolerating regular diet/drink without nausea/vomiting. She is scheduled for a follow-up with Dr. Duard Brady on 07/24/2020.

## 2020-07-21 NOTE — Unmapped (Addendum)
Patient slept well overnight. Had some difficulty orienting to time and situation on assessment but was familiar with person and place. Patient denied pain or discomfort overnight. No PRN's requested. Subcutaneous Heparin tx continued. Pitting edema noted on b/l lower ext. UOP slightly lower than adequate. MD aware. Urine discarded before RN was able to send UA sample. All relevant staff have been made aware of outstanding UA/GI path panels and to not discard urine. VSS. No acute events.        Problem: Adult Inpatient Plan of Care  Goal: Plan of Care Review  Outcome: Progressing  Goal: Patient-Specific Goal (Individualized)  Outcome: Progressing  Goal: Absence of Hospital-Acquired Illness or Injury  Outcome: Progressing  Intervention: Identify and Manage Fall Risk  Recent Flowsheet Documentation  Taken 07/20/2020 1907 by Sherrie George, RN  Safety Interventions: fall reduction program maintained  Intervention: Prevent Skin Injury  Recent Flowsheet Documentation  Taken 07/20/2020 1907 by Sherrie George, RN  Skin Protection: adhesive use limited  Intervention: Prevent and Manage VTE (Venous Thromboembolism) Risk  Recent Flowsheet Documentation  Taken 07/20/2020 1907 by Sherrie George, RN  Activity Management: activity adjusted per tolerance  Goal: Optimal Comfort and Wellbeing  Outcome: Progressing  Goal: Readiness for Transition of Care  Outcome: Progressing  Goal: Rounds/Family Conference  Outcome: Progressing     Problem: Impaired Wound Healing  Goal: Optimal Wound Healing  Outcome: Progressing  Intervention: Promote Wound Healing  Recent Flowsheet Documentation  Taken 07/20/2020 1907 by Sherrie George, RN  Activity Management: activity adjusted per tolerance

## 2020-07-21 NOTE — Unmapped (Signed)
OCCUPATIONAL THERAPY  Evaluation (07/21/20 1041)    Patient Name:  Lisa Burton       Medical Record Number: [id number]   Date of Birth: 01/14/1946  Sex: Female          OT Treatment Diagnosis:  Decreased balance, fall at home, word finding difficulty    Assessment  Problem List: Fall Risk, Decreased safety awareness, Impaired balance, Decreased mobility, Impaired ADLs    Assessment: Lisa Burton is a 75 y.o. 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 She presents to acute OT with deficits in balance, cognition, and overall strength limiting her independence and safety with ADL. She required Min A for all transfers/mobility this session as she refused to use a cane or RW. She has good insight into her current deficits but does not like the idea of using an assistive device. She has some assistance at home from her almost 77 y/o husband. She reports some ongoing chemo brain that is most noticeable in her word finding ability. She will benefit from acute OT and currently post acute OT 5x/wk; however, could progress to 3x/wk if she was agreeable to using an assistive device at home (RW or rollator). Will continue to follow. After review of contributing co-mobidities and personal factors, clinical presentation and exam findings, patient demonstrates low complexity for evaluation and development of plan of care.    Today's Interventions: Grooming task at sink, toileting and lower body dressing assist, mobility around room. Pt education re: safety awareness at home and importance of using assistive device, fall prevention at home, safety with ADL, importance of calling for assist with mobility while in hospital, reorientation    Activity Tolerance During Today's Session  Tolerated treatment well    Plan  Planned Frequency of Treatment:  1-2x per day for: 3-4x week       Planned Interventions:  Adaptive equipment, ADL retraining, Balance activities, Bed mobility, Compensatory tech. training, Conservation, Education - Patient, Home exercise program, Functional mobility, Functional cognition, Environmental support, Endurance activities, Education - Family / caregiver, Teacher, early years/pre, UE Strength / coordination exercise, Therapeutic exercise, Range of motion, Safety education    Post-Discharge Occupational Therapy Recommendations:   5x weekly, Low intensity (could progress to 3x/wk if agreeable to use of rolling walker. Otherwise requires Min A for all mobility/transfers)   OT DME Recommendations: None -        GOALS:   Patient and Family Goals: To return home    Long Term Goal #1: Pt will score 24/24 on AMPAC in 2 months       Short Term:  Pt will complete toileting and toilet transfer with supervision +LRAD   Time Frame : 1 week  Pt will complete full body dressing including clothing retrieval with supervision +LRAD   Time Frame : 1 week  Pt will compelte 5 minute standing ADL/IADl with supervision +LRAD   Time Frame : 1 week    Prognosis:  Good  Positive Indicators:  PLOF  Barriers to Discharge: Functional strength deficits, Endurance deficits, Impaired Balance, Decreased safety awareness    Subjective  Current Status Pt left semi reclined in bed with bed alarm activated, call bell in reach, fall precautions reviewed  Prior Functional Status Per pt statement, she was (I) with ADLs, cooking, cleaning, and driving.  Pt reports a recent fall, difficulty getting out of the floor. Husband has been doing more driving lately.  Husband is home and can help but he is 65 and has health issues of his own. She reports a recent anxiety attack at the urgernt care office related to how she was going to get her car home and reports that is out of character for her. Has a women to help with cooking and a cleaning crew    Medical Tests / Procedures: Reviewed, CT head 4/7: No evidence of acute intracranial hemorrhage or other acute abnormality.  Services patient receives: PT    Patient / Caregiver reports: I have a cane but I don't like to use it, I definitley wouldn't use a walker    Past Medical History:   Diagnosis Date   ??? ADHD (attention deficit hyperactivity disorder)    ??? Anxiety    ??? At risk for falls    ??? Baker's cyst    ??? Fractures    ??? Hypersomnia    ??? Hypertension    ??? Injury of posterior cruciate ligament    ??? Insomnia    ??? Irritable colon 07/10/2010   ??? Joint pain    ??? Joint replaced    ??? Major depressive disorder    ??? OSA (obstructive sleep apnea)    ??? Osteoarthritis    ??? Osteoporosis    ??? Ovarian cancer (CMS-HCC) 07/03/2017   ??? Personality disorder (CMS-HCC)     Borderline Personality Disorder   ??? PLMD (periodic limb movement disorder)    ??? Tear of meniscus of knee    ??? Urge incontinence 11/12/2012   ??? Vestibular dysfunction 09/16/2013    Social History     Tobacco Use   ??? Smoking status: Never Smoker   ??? Smokeless tobacco: Never Used   Substance Use Topics   ??? Alcohol use: Yes     Alcohol/week: 1.0 - 2.0 standard drink     Types: 1 - 2 Shots of liquor per week      Past Surgical History:   Procedure Laterality Date   ??? CESAREAN SECTION  1981   ??? FINGER FRACTURE SURGERY Right 10/2013    thumb   ??? FOOT SURGERY Bilateral 9/00, 01/14    hardware   ??? IR INSERT PORT AGE GREATER THAN 5 YRS  07/04/2017    IR INSERT PORT AGE GREATER THAN 5 YRS 07/04/2017 [name], MD IMG VIR H&V [facility]   ??? PR BIL SALP-OOPH W/OMENTECT,TAH,RAD DISSECT Bilateral 07/01/2017    Procedure: BILATERAL SALPINGO-OOPHORECTOMY W/OMENTECTOMY, TOTAL ABD HYSTERECTOMY & RADICAL DISSECTION FOR DEBULKING;  Surgeon: Ermelinda Das, MD;  Location: MAIN OR [facility];  Service: Gynecology Oncology   ??? PR LAP,DIAGNOSTIC ABDOMEN N/A 07/01/2017    Procedure: Laparoscopy, Abdomen, Peritoneum, & Omentum, Diagnostic, W/Wo Collection Specimen(S) By Brushing Or Washing;  Surgeon: Ermelinda Das, MD;  Location: MAIN OR [facility];  Service: Gynecology Oncology   ??? 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 TONSILLECTOMY  1970   ??? WRIST FRACTURE SURGERY Right 1998    wrist    Family History   Problem Relation Age of Onset   ??? Personality disorder Sister    ??? Cancer Father         GI cancer 37's   ??? Arthritis Father    ??? Depression Son    ??? Arthritis Sister    ??? Arthritis Mother    ??? ADD / ADHD Neg Hx    ??? Alcohol abuse Neg Hx    ??? Anxiety disorder Neg Hx    ??? Bipolar disorder  Neg Hx    ??? Dementia Neg Hx    ??? Drug abuse Neg Hx    ??? OCD Neg Hx    ??? Paranoid behavior Neg Hx    ??? Physical abuse Neg Hx    ??? Schizophrenia Neg Hx    ??? Seizures Neg Hx    ??? Sexual abuse Neg Hx    ??? Sleep disorder Neg Hx         Codeine and Sulfa (sulfonamide antibiotics)     Objective Findings  Precautions / Restrictions  Falls precautions    Weight Bearing  Non-applicable    Required Braces or Orthoses  Non-applicable    Communication Preference  Verbal    Pain  Denies pain    Equipment / Environment  Vascular access (PIV, TLC, Port-a-cath, PICC)    Living Situation  Living Environment: House  Lives With: Spouse  Home Living: One level home, Stairs to enter without rails  Number of Stairs to Enter (outside): 1  Equipment available at home: Quad cane, Agricultural consultant     Cognition   Orientation Level:  Disoriented to time   Arousal/Alertness:  Appropriate responses to stimuli   Attention Span:  Appears intact   Memory:  Appears intact   Following Commands:  Follows multistep commands with repetition   Safety Judgment:      Awareness of Errors:      Problem Solving:      Comments: Pt reports word finding difficulties related to her recent chemo I feel stupid sometimes but I am not    Vision / Hearing   Vision: Wears glasses for reading only     Hearing: No deficit identified         Hand Function:  Right Hand Function: Right hand grip strength, ROM and coordination WNL  Left Hand Function: Left hand grip strength, ROM and coordination WNL  Hand Function comments: Slight R sided tremor noted  Hand Dominance: Right    Skin Inspection:  Skin Inspection comment: Edema to BLE    ROM / Strength:  UE ROM/Strength: Right WFL, Left WFL  LE ROM/Strength: Right WFL, Left WFL    Coordination:  Coordination: WFL  Coordination comment: Slight tremor noted during functional task    Sensation:  RUE Sensation: RUE intact  LUE Sensation: LUE intact    Balance:  Sitting: Sueprvision; Static standing: CGA, dynamic standing: Min A    Functional Mobility  Transfer Assistance Needed: Yes  Transfers - Needs Assistance: Min assist (sit>stand via hand held assist, pt not agreeable to using cane or RW)  Bed Mobility Assistance Needed: Yes  Bed Mobility - Needs Assistance: Min assist  Ambulation: Min A via HHA for mobility around room and into bathroom, pt not agreeable to using RW or cane.      ADLs  ADLs: Needs assistance with ADLs  ADLs - Needs Assistance: LB dressing, Toileting, Bathing, UB dressing, Grooming  Grooming - Needs Assistance: Performed standing, Requires additional structure (CGA at sink for washing face/hands and completing oral hygiene)  Bathing - Needs Assistance: Mod assist  Toileting - Needs Assistance: Min assist (from regular height toilet)  UB Dressing - Needs Assistance: Set Up Assist, Performed seated  LB Dressing - Needs Assistance: Min assist (for donning/doffing depends)      Vitals / Orthostatics  With Activity: NAD      Medical Staff Made Aware: RN aware      Occupational Therapy Session Duration  OT Individual [mins]: 40  I attest that I have reviewed the above information.  Signed: Jaclyn Shaggy, OT  Filed 07/21/2020

## 2020-07-21 NOTE — Unmapped (Signed)
VSS. Pt denied pain. Pt reported itching relieved by PRN hydrocortisone cream. Disoriented to place in AM. Oriented x4 later in the day. Bed alarm in place d/t disorientation shown in the morning. Pt ambulating to the bathroom. Remained free from falls and injuries. Pt was seen by PT, Neuro and psych. No BM. Low urine output. MD aware. Various labs collected. CIWA score completed. Bolus given. Tolerating reg diet. Plan of care reviewed with pt. All questions answered.     Problem: Adult Inpatient Plan of Care  Goal: Plan of Care Review  Outcome: Progressing

## 2020-07-21 NOTE — Unmapped (Signed)
PHYSICAL THERAPY  Evaluation (07/21/20 0745)      Patient Name:?? Lisa Burton????????   Medical Record Number: [id number]   Date of Birth: 1945-08-16  Sex: Female??????????????      Treatment Diagnosis: Deconditioning     Activity Tolerance: Tolerated treatment well     ASSESSMENT  Problem List: Decreased endurance, Impaired balance, Decreased mobility, Fall Risk      Assessment : Lisa Burton is a 75 y.o. 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 Pt presents to PT with decreased activity tolerance. Able to ambulate with rollator, CGA, steady gait. Recommend 3x/week post acute PT      Today's Interventions: Eval, mobility, transfers, gait. Education: ambulation with LRAD and assist, POC              PLAN  Planned Frequency of Treatment:?? 1-2x per day for: 3-4x week       Planned Interventions: Balance activities, Education - Patient, Education - Family / caregiver, Endurance activities, Functional mobility, Investment banker, operational, Self-care / Home training, Therapeutic exercise, Therapeutic activity, Transfer training     Post-Discharge Physical Therapy Recommendations:?? 3x weekly     PT DME Recommendations:  (rollator)??????????       Goals:   Patient and Family Goals: to return home     Long Term Goal #1: AM-PAC 23/24 within 8 weeks  Long Term Goal #2: n/a     SHORT GOAL #1: Sit <> stand with LRAD, SBA  ?????????????????????? Time Frame : 1 week  SHORT GOAL #2: Ambulate > 200 ft with LRAD, SBA  ?????????????????????? Time Frame : 1 week   ??????????????????????       Prognosis:?? Good  Positive Indicators: PLOF, motivation  Barriers to Discharge: Functional strength deficits, Endurance deficits     SUBJECTIVE     Patient reports: agreeable to PT  Current Functional Status: Pt received in bed and left sitting EOB. Bed alarm on; MD present; RN aware  Services patient receives: PT  Prior Functional Status: Pt reports she is independent with ADLs and ambulates with a cane. H/o fall. Has HH aide for cleaning, friend assists with dish washing once a month. Someone assists for cooking once a week.  Equipment available at home: Quad cane, Agricultural consultant      Past Medical History:   Diagnosis Date   ??? ADHD (attention deficit hyperactivity disorder)    ??? Anxiety    ??? At risk for falls    ??? Baker's cyst    ??? Fractures    ??? Hypersomnia    ??? Hypertension    ??? Injury of posterior cruciate ligament    ??? Insomnia    ??? Irritable colon 07/10/2010   ??? Joint pain    ??? Joint replaced    ??? Major depressive disorder    ??? OSA (obstructive sleep apnea)    ??? Osteoarthritis    ??? Osteoporosis    ??? Ovarian cancer (CMS-HCC) 07/03/2017   ??? Personality disorder (CMS-HCC)     Borderline Personality Disorder   ??? PLMD (periodic limb movement disorder)    ??? Tear of meniscus of knee    ??? Urge incontinence 11/12/2012   ??? Vestibular dysfunction 09/16/2013         @  Past Surgical History:   Procedure Laterality Date   ??? CESAREAN SECTION  1981   ??? FINGER FRACTURE SURGERY Right 10/2013    thumb   ???  FOOT SURGERY Bilateral 9/00, 01/14    hardware   ??? IR INSERT PORT AGE GREATER THAN 5 YRS  07/04/2017    IR INSERT PORT AGE GREATER THAN 5 YRS 07/04/2017 [name], MD IMG VIR H&V [facility]   ??? PR BIL SALP-OOPH W/OMENTECT,TAH,RAD DISSECT Bilateral 07/01/2017    Procedure: BILATERAL SALPINGO-OOPHORECTOMY W/OMENTECTOMY, TOTAL ABD HYSTERECTOMY & RADICAL DISSECTION FOR DEBULKING;  Surgeon: Ermelinda Das, MD;  Location: MAIN OR [facility];  Service: Gynecology Oncology   ??? PR LAP,DIAGNOSTIC ABDOMEN N/A 07/01/2017    Procedure: Laparoscopy, Abdomen, Peritoneum, & Omentum, Diagnostic, W/Wo Collection Specimen(S) By Brushing Or Washing;  Surgeon: Ermelinda Das, MD;  Location: MAIN OR [facility];  Service: Gynecology Oncology   ??? 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 TONSILLECTOMY  1970   ??? WRIST FRACTURE SURGERY Right 1998    wrist          @     Allergies: Codeine and Sulfa (sulfonamide antibiotics) Objective Findings  Precautions / Restrictions  Precautions: Falls precautions  Weight Bearing Status: Non-applicable  Required Braces or Orthoses: Non-applicable     Communication Preference: Verbal          Pain Comments: 0/10  Medical Tests / Procedures: Vital signs, Labs, Imaging, Medical Review  Equipment / Environment: Patient not wearing mask for full session (PT wearing mask and eye protection for full session)     At Rest: BP 113/69, HR 81, O2sat 99%RA              Living Situation  Living Environment: House  Lives With: Spouse (83 y.o)  Home Living: One level home, Stairs to enter without rails  Number of Stairs to Enter (outside): 1      Cognition  Cognition: WFL  Orientation: Oriented x4  Visual / Perception status  Visual/Perception: Within Functional Limits     Skin Inspection: Intact where visualized, Swelling  Skin Inspection comment: LE swelling     UE ROM / Strength  UE ROM/Strength: Left WFL, Right WFL  LE ROM / Strength  LE ROM/Strength: Left WFL, Right WFL     Motor/ Sensory/ Neuro  Balance: Impaired dynamic standing balance, Standing balance (needs UE support)  Balance comment: good sitting balance EOB; good standing balance with rollator  Posture: WFL            Transfers  Transfers: Supine to Sit, Sit to Stand  Supine to Sit assistance level: 25% or less physical assistance  Supine to Sit comments: Supine to sit with HOB elevated, CGA  Sit to Stand assistance level: 25% or less physical assistance  Sit to Stand comments: Sit <> stand with rollator, CGA      Gait  Level of Assistance: Contact guard assist, steadying assist  Assistive Device: Four wheel walker  Distance Ambulated (ft): 150 ft  Gait: steady     Stairs: NT      Wheelchair Mobility: n/a     Endurance: fair     Physical Therapy Session Duration  PT Individual [mins]: 30     Medical Staff Made Aware: RN aware     I attest that I have reviewed the above information.  Signed: Fredia Beets, PT  Filed 07/21/2020

## 2020-07-21 NOTE — Unmapped (Signed)
Adult Nutrition Assessment Note    Visit Type: MD Consult  Reason for Visit: FTT      HPI & PMH:    Lisa Burton is a 75 y.o. 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    Anthropometric Data:  Height: 154.9 cm (5' 1)   Admission weight: 56.7 kg (125 lb)  Last recorded weight: 56.7 kg (125 lb)  IBW: 47.71 kg  Percent IBW: 118.84 %  BMI: Body mass index is 23.62 kg/m??.   Usual Body Weight: 120lbs per pt    Weight history prior to admission: weight fluctuations with lasix   Wt Readings from Last 10 Encounters:   07/20/20 56.7 kg (125 lb)   07/20/20 56.8 kg (125 lb 3.2 oz)   07/17/20 56.9 kg (125 lb 6.4 oz)   07/07/20 53.5 kg (118 lb)   07/03/20 54.8 kg (120 lb 12.8 oz)   07/03/20 54.8 kg (120 lb 12.8 oz)   06/28/20 53 kg (116 lb 12.8 oz)   06/26/20 56.7 kg (125 lb)   06/12/20 57.1 kg (125 lb 14.1 oz)   06/12/20 57.1 kg (125 lb 14.1 oz)        Weight changes this admission:   Last 5 Recorded Weights    07/20/20 1210   Weight: 56.7 kg (125 lb)        Nutrition Focused Physical Exam:  Nutrition Focused Physical Exam:  Fat Areas Examined  Orbital: Moderate loss  Upper Arm: Severe loss      Muscle Areas Examined  Temple: Severe loss  Clavicle: Severe loss  Acromion: Severe loss    Fluid Accumulation/Edema  Lower Extremities: 3+ pitting edema                NUTRITIONALLY RELEVANT DATA     Medications:   Nutritionally pertinent medications reviewed and evaluated for potential food and/or medication interactions.   Remeron    Labs:     Results in Past 7 Days  Result Component Current Result   Sodium 134 (L) (07/21/2020)   Potassium 3.6 (07/21/2020)   Chloride 105 (07/21/2020)   CO2 20.0 (07/21/2020)   BUN 48 (H) (07/21/2020)   Creatinine 2.10 (H) (07/21/2020)   Glucose 89 (07/21/2020)   Calcium 8.2 (L) (07/21/2020)   Magnesium 2.2 (07/21/2020)   Phosphorus 4.3 (07/21/2020)       Nutrition History:   July 21, 2020: Prior to admission: Reported poor po intake and confusion PTA. Pt reports having thrush. It is getting better with the mouth rinse she uses but was worse recently at home and interfering with her ability to eat. Eating is up and down per pt. Reports she devoured breakfast this morning but then states she didn't eat much-tray at bedside with ~50% consumed.    Allergies, Intolerances, Sensitivities, and/or Cultural/Religious Dietary Restrictions: none identified at this time     Current Nutrition:  Oral intake        Nutrition Orders   (From admission, onward)             Start     Ordered    07/20/20 1439  Nutrition Therapy Regular/House; Fiber Restricted  Effective now        Question Answer Comment   Nutrition Therapy: Regular/House    Modifiers: Fiber Restricted        07/20/20 1438  Nutritional Needs:   Daily Estimated Nutrient Needs:  Energy: 1650-1925 kcals 30-35 kcal/kg using estimated dry weight, 55 kg (07/21/20 1224)]  Protein: 65-85 gm [1.2-1.5 gm/kg using estimated dry weight, 55 kg (07/21/20 1224)]  Carbohydrate:   [no restriction]  Fluid:   mL [per MD team]        Malnutrition Assessment using AND/ASPEN Clinical Characteristics:    Non-severe (Moderate) Protein-Calorie Malnutrition in the context of acute illness or injury (07/21/20 1224)  Energy Intake: < or equal to 50% of estimated energy requirement for > or equal to 5 days  Fat Loss: Moderate  Muscle Loss: Moderate  Malnutrition Score: 3      GOALS and EVALUATION     ??? Patient to meet at least 80% or greater of nutritional needs via combination of meals, snacks, and/or oral supplements within 5 days.  - New    Motivation, Barriers, and Compliance:  Evaluation of motivation, barriers, and compliance pending at this time due to clinical status.     NUTRITION ASSESSMENT   FTT likely multifactorial: reported confusion, thrush, poor appetite. Seems to be interested in eating here and amenable to supplements. Hopefully with continual improvement in thrush, support from psychiatry team, supplement shakes, will be able to meet nutrition needs orally.    Fluid weight gain likely masking any actual weight loss    Discharge Planning:   Monitor for potential discharge needs with multi-disciplinary team.       NUTRITION INTERVENTIONS and RECOMMENDATION   -Change diet to regular  -Continue calorie counts  -Encourage small frequent meals/snacks   -Offer/encourage liquids that contain calories instead of water   -Have supplements/shakes available at the bedside to sip on through out the day-will send automatically between standard meal times   -Give oral meds with supplements or shakes instead of water   -suggest daily multivitamin with minerals  -Consider IV thiamine-indicated for confusion, Etoh intake.    Follow-Up Parameters:   1-2 times per week (and more frequent as indicated)    Arcola Jansky MS, RD, LDN, CNSC

## 2020-07-21 NOTE — Unmapped (Signed)
Initial Consult Note        Requesting Attending Physician:  Verlin Dike, MD  Service Requesting Consult: Gynecology (GYN)     Assessment and Plan        Lisa Burton is a 75 y.o.  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     #Essential Tremor   Patient states that she noticed her tremor began a couple of months ago. This tremor is only present when patient is moving or doing fine motor tasks and is not present at rest, most consistent w/ essential tremor. Patient also has long history of uses with medications that can contribute to tremor including: Effexor, Abilify, Zofran, Reglan, Remeron as well as multiple rounds of chemotherapy as stated in the HPI that also may contribute to tremor. Patient also w/ severe FTT and may be at high risk of vitamin deficiencies so we will evaluate these. Patient also w/ current metabolic and electrolyte disturbances that can worsen existing tremor. Patient states that she has recently started drinking alcohol about 2 beers a day around 1 week ago, she does not state that she has trouble w/ ETOH abuse in the past and denies any visual or tactile hallucinations. However, patient does have pattern of AST elevation w/ macrocytic anemia which means she could be under-reporting her ETOH use so CIWA scoring would not be un-reasonable.   - CIWA protocol (scoring only for now), although low suspicion for w/d  - recommend sending: B1, B12, Folate, MMA, Homocystine, TSH  - recommend dietician consult in setting of FTT and further evaluation of potential vitamin deficiencies   - correction of metabolic derangements (elevated BUN/CR, AST) and replete electrolytes (mild hyponatremia and hypocalcemia) PRN that can worsen existing tremor   - patient does not find tremor debilitating so no indication for treatment at this time   - if tremor worsens or persists and become debilitating, patient can follow-up w/ [facility] in the outpatient setting     -We will sign off at this time    This patient was seen and discussed with Dr. Dina Rich, who agrees with the above assessment and plan.      Janice Coffin MD   PGY2 Neurology        HPI      Reason for Consult: tremor    Lisa Burton is a 75 y.o.  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     To review patient cancer history, patient was diagnosed w/ Stage 3C fallopian tube carcinoma. Patient had tumor debulking in 2019. Patient also w/ multiple rounds of chemo including: carboplatin, paclitaxel, niraparib, olaparib. Patinet most recent round of chemo was in 06/12/20 with: cyclophosphamide/pembrolizumab/bevacizumab.     Patient is currently admitted to the Gyn/Onc service for failure to thrive and weight loss as well as decreased PO intake and decreased appetite. Patient initial labs show an AKI on CKD (patient baseline Cr of 1.5) BUN/Cr showed elevation to 56/2.55. Patient w/ macrocytic anemia that w/ MCV elevated to 109. Patient also has AST elevation in comparison  to ALT, 60/40. Na slightly low at 134 as well as hypocalcemia to 8.2. Patient last TSH was 1.53/T4 was 1.3.     We are consulted for tremor. Patient states that she notices a tremor a couple of weeks ago. She says that she notices this when she is eating the most and involves the bilateral hands the most. This tremor is only present during movement and is not present at rest. She states that this tremor does not decrease her quality of life and that she is able to do all the things she needs to do because it is so subtle. Patient had an episode of tremulousness and severe anxiety at a visit to urgent care recently but this does not sound in line w/ a true tremor, and has not happened since. She also has developed a new craving for beed and states that over the last week or so she has been drinking 2 beers per day. She says that she has never had a problem w/ ETOH and has never experienced ETOH w/d or been diagnosed w/ alcohol use disorder. However, labs do show some concern for chronic alcohol use pattern. Denies hallucations visual or tactile.     Allergies   Allergen Reactions   ??? Codeine Rash   ??? Sulfa (Sulfonamide Antibiotics) Rash and Nausea Only      Current Facility-Administered Medications   Medication Dose Route Frequency Provider Last Rate Last Admin   ??? acetaminophen (TYLENOL) tablet 650 mg  650 mg Oral Q4H PRN Saintclair Halsted, MD       ??? ARIPiprazole (ABILIFY) tablet 10 mg  10 mg Oral Daily Saintclair Halsted, MD   10 mg at 07/20/20 1334   ??? atorvastatin (LIPITOR) tablet 10 mg  10 mg Oral Nightly Saintclair Halsted, MD   10 mg at 07/20/20 2133   ??? calcium carbonate (TUMS) chewable tablet 400 mg of elem calcium  400 mg of elem calcium Oral Daily PRN Saintclair Halsted, MD       ??? heparin (porcine) 5,000 unit/mL injection 5,000 Units  5,000 Units Subcutaneous Q12H [facility] Saintclair Halsted, MD   5,000 Units at 07/20/20 2133   ??? hydrocortisone 1 % cream   Topical BID PRN Saintclair Halsted, MD   Given at 07/20/20 1747   ??? latanoprost (XALATAN) 0.005 % ophthalmic solution 1 drop  1 drop Both Eyes Nightly Saintclair Halsted, MD   1 drop at 07/20/20 2134   ??? melatonin tablet 3 mg  3 mg Oral Nightly PRN Saintclair Halsted, MD       ??? metoclopramide (REGLAN) tablet 10 mg  10 mg Oral TID PRN Saintclair Halsted, MD       ??? mirtazapine (REMERON) tablet 7.5 mg  7.5 mg Oral Nightly Saintclair Halsted, MD   7.5 mg at 07/20/20 2133   ??? pantoprazole (PROTONIX) EC tablet 20 mg  20 mg Oral Daily Saintclair Halsted, MD   20 mg at 07/20/20 1334   ??? venlafaxine (EFFEXOR-XR) 24 hr capsule 300 mg  300 mg Oral QAM Saintclair Halsted, MD           Past Medical History:   Diagnosis Date ??? ADHD (attention deficit hyperactivity disorder)    ??? Anxiety    ??? At risk for falls    ??? Baker's cyst    ??? Fractures    ??? Hypersomnia    ??? Hypertension    ??? Injury of posterior cruciate ligament    ??? Insomnia    ??? Irritable colon 07/10/2010   ???  Joint pain    ??? Joint replaced    ??? Major depressive disorder    ??? OSA (obstructive sleep apnea)    ??? Osteoarthritis    ??? Osteoporosis    ??? Ovarian cancer (CMS-HCC) 07/03/2017   ??? Personality disorder (CMS-HCC)     Borderline Personality Disorder   ??? PLMD (periodic limb movement disorder)    ??? Tear of meniscus of knee    ??? Urge incontinence 11/12/2012   ??? Vestibular dysfunction 09/16/2013       Past Surgical History:   Procedure Laterality Date   ??? CESAREAN SECTION  1981   ??? FINGER FRACTURE SURGERY Right 10/2013    thumb   ??? FOOT SURGERY Bilateral 9/00, 01/14    hardware   ??? IR INSERT PORT AGE GREATER THAN 5 YRS  07/04/2017    IR INSERT PORT AGE GREATER THAN 5 YRS 07/04/2017 [name], MD IMG VIR H&V [facility]   ??? PR BIL SALP-OOPH W/OMENTECT,TAH,RAD DISSECT Bilateral 07/01/2017    Procedure: BILATERAL SALPINGO-OOPHORECTOMY W/OMENTECTOMY, TOTAL ABD HYSTERECTOMY & RADICAL DISSECTION FOR DEBULKING;  Surgeon: Ermelinda Das, MD;  Location: MAIN OR [facility];  Service: Gynecology Oncology   ??? PR LAP,DIAGNOSTIC ABDOMEN N/A 07/01/2017    Procedure: Laparoscopy, Abdomen, Peritoneum, & Omentum, Diagnostic, W/Wo Collection Specimen(S) By Brushing Or Washing;  Surgeon: Ermelinda Das, MD;  Location: MAIN OR [facility];  Service: Gynecology Oncology   ??? 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 TONSILLECTOMY  1970   ??? WRIST FRACTURE SURGERY Right 1998    wrist       Social History     Socioeconomic History   ??? Marital status: Married     Spouse name: Not on file   ??? Number of children: 1   ??? Years of education: Not on file   ??? Highest education level: Not on file   Occupational History   ??? Occupation: retired IT trainer   Tobacco Use   ??? Smoking status: Never Smoker   ??? Smokeless tobacco: Never Used   Vaping Use   ??? Vaping Use: Never used   Substance and Sexual Activity   ??? Alcohol use: Yes     Alcohol/week: 1.0 - 2.0 standard drink     Types: 1 - 2 Shots of liquor per week   ??? Drug use: No   ??? Sexual activity: Not on file   Other Topics Concern   ??? 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  Partner is somewhat older in 19's and they live together in [place]. She has a son who is a Clinical research associate, and does litigation in [place].  He went to [employer].  She also has a granddaughter only 54 year old in 2015.    12/10/16        PCMH Components:        Family, social, cultural characteristics: social support includes  Odiss MCGrew her partner .      Patient has the following communication needs: none, per patient     Health Literacy: How confident are you that you understand your health issues/concerns, can participate in your care, and manage your care along with your physician: confident.    Behaviors Affecting Health: none, per patient    Family history of mental health illness and/or substance abuse: mental health illness: yes, as  noted in family history. Sister with personality disorder    Have you been seen by any medical provider that we have not referred you to since your last visit ? No    Discussed a Living Will with the patient and the : Patient interested in education materials about a living will. Materials provided at today's visit.      Social Determinants of Health     Financial Resource Strain: Low Risk    ??? Difficulty of Paying Living Expenses: Not hard at all   Food Insecurity: No Food Insecurity   ??? Worried About Programme researcher, broadcasting/film/video in the Last Year: Never true   ??? Ran Out of Food in the Last Year: Never true   Transportation Needs: No Transportation Needs   ??? Lack of Transportation (Medical): No   ??? Lack of Transportation (Non-Medical): No   Physical Activity: Unknown   ??? Days of Exercise per Week: 0 days   ??? Minutes of Exercise per Session: Not on file   Stress: No Stress Concern Present   ??? Feeling of Stress : Not at all   Social Connections: Socially Integrated   ??? Frequency of Communication with Friends and Family: More than three times a week   ??? Frequency of Social Gatherings with Friends and Family: More than three times a week   ??? Attends Religious Services: More than 4 times per year   ??? Active Member of Clubs or Organizations: Yes   ??? Attends Banker Meetings: More than 4 times per year   ??? Marital Status: Married     Family History   Problem Relation Age of Onset   ??? Personality disorder Sister    ??? Cancer Father         GI cancer 84's   ??? Arthritis Father    ??? Depression Son    ??? Arthritis Sister    ??? Arthritis Mother    ??? ADD / ADHD Neg Hx    ??? Alcohol abuse Neg Hx    ??? Anxiety disorder Neg Hx    ??? Bipolar disorder Neg Hx    ??? Dementia Neg Hx    ??? Drug abuse Neg Hx    ??? OCD Neg Hx    ??? Paranoid behavior Neg Hx    ??? Physical abuse Neg Hx    ??? Schizophrenia Neg Hx    ??? Seizures Neg Hx    ??? Sexual abuse Neg Hx    ??? Sleep disorder Neg Hx        Code Status: DNR and DNI     Review of Systems     A 12-system review of systems was conducted and was negative except as documented above in the HPI.       Objective        Temp:  [36.3 ??C-36.9 ??C] 36.8 ??C  Heart Rate:  [81-98] 81  Resp:  [12-15] 15  BP: (112-117)/(66-99) 113/69  MAP (mmHg):  [82-104] 82  SpO2:  [97 %-99 %] 99 %  BMI (Calculated):  [23.63] 23.63  No intake/output data recorded.    Physical Exam:   General Appearance: Chronically ill appearing. In no acute distress.  HEENT: Head is atraumatic and normocephalic. Sclera anicteric without injection. Oropharyngeal membranes are moist with no erythema or exudate.  Neck: Deferred.  Lungs: Normal work of breathing.  Heart: Regular rate and rhythm. No murmurs, rubs, or gallops.  Abdomen: Soft, nontender, nondistended.  Extremities: No clubbing, cyanosis, or  edema.    Neurological Examination:   Mental Status: Alert, conversant, able to follow conversation and interview. Spontaneous speech was fluent without word finding pauses, dysarthria, or paraphasic errors. Comprehension was intact. Memory for recent and remote events was intact.    Cranial Nerves: PERRL. Pursuit eye movements were uninterrupted with full range and without more than end-gaze nystagmus. Facial sensation intact bilaterally to light touch in all three divisions of CNV. Face symmetric at rest. Normal facial movement bilaterally, including forehead, eye closure and grimace/smile. Hearing intact to conversation. Shoulder shrug full strength bilaterally. Palate movement is symmetric. Tongue protrudes midline and tongue movements are normal.     Motor Exam:   Normal bulk.  Pronator drift is absent.   Intention tremor present w/ FTN high frequency low amplitude in nautre in BL hands  Postural tremor present high frequency low amplitude in nature in BL hands  RUE: 5/5 throughout  LUE: 5/5 throughout  RLE: 4+/5 w/ hip flexion, 5/5 throughout otherwise  LLE: 4+/5 w/ hip flexion, 5/5 throughout otherwise    Reflexes: DTRs are 2+ and brisk and symmetric throughout with the exception of BL ankles which are 1+. Toes are downgoing bilaterally.    Sensory:   LT intact x 4 extremities   Vibration intact x4 w/ exception of decreased vib from the right shin and below     Cerebellar/Coordination/Gait: Rapid alternating movements are normal in bilateral upper extremities. Finger-to-nose is normal without ataxia or dysmetria bilaterally. Heel-to-shin is normal without ataxia or dysmetria bilaterally. Patient w/ mild gait impairment but able to stand w/ no assistance and take a couple of steady steps. Romberg -.      Diagnostic Studies      All Labs Last 24hrs:   Recent Results (from the past 24 hour(s))   Comprehensive Metabolic Panel    Collection Time: 07/20/20  8:35 AM   Result Value Ref Range    Sodium 133 (L) 135 - 145 mmol/L    Potassium 3.7 3.4 - 4.8 mmol/L    Chloride 102 98 - 107 mmol/L    Anion Gap 17 (H) 5 - 14 mmol/L    CO2 14.4 (L) 20.0 - 31.0 mmol/L    BUN 56 (H) 9 - 23 mg/dL    Creatinine 1.61 (H) 0.60 - 0.80 mg/dL    BUN/Creatinine Ratio 22     EGFR CKD-EPI Non-African American, Female 18 (L) >=60 mL/min/1.27m2    EGFR CKD-EPI African American, Female 21 (L) >=60 mL/min/1.9m2    Glucose 119 70 - 179 mg/dL    Calcium 8.8 8.7 - 09.6 mg/dL    Albumin 3.4 3.4 - 5.0 g/dL    Total Protein 6.8 5.7 - 8.2 g/dL    Total Bilirubin 0.8 0.3 - 1.2 mg/dL    AST 63 (H) <=04 U/L    ALT 44 10 - 49 U/L    Alkaline Phosphatase 375 (H) 46 - 116 U/L   Magnesium Level    Collection Time: 07/20/20  8:35 AM   Result Value Ref Range    Magnesium 1.4 (L) 1.6 - 2.6 mg/dL   Creatinine    Collection Time: 07/20/20  8:35 AM   Result Value Ref Range    Creatinine 2.55 (H) 0.60 - 0.80 mg/dL    EGFR CKD-EPI Non-African American, Female 18 (L) >=60 mL/min/1.75m2    EGFR CKD-EPI African American, Female 21 (L) >=60 mL/min/1.36m2   CBC w/ Differential    Collection Time: 07/20/20  8:35 AM   Result  Value Ref Range    WBC 7.2 3.6 - 11.2 10*9/L    RBC 2.66 (L) 3.95 - 5.13 10*12/L    HGB 10.0 (L) 11.3 - 14.9 g/dL    HCT 16.1 (L) 09.6 - 44.0 %    MCV 111.8 (H) 77.6 - 95.7 fL    MCH 37.5 (H) 25.9 - 32.4 pg    MCHC 33.6 32.0 - 36.0 g/dL    RDW 04.5 (H) 40.9 - 15.2 %    MPV 8.3 6.8 - 10.7 fL    Platelet 223 150 - 450 10*9/L    nRBC 0 <=4 /100 WBCs    Neutrophils % 71.5 %    Lymphocytes % 10.4 %    Monocytes % 16.7 %    Eosinophils % 0.8 %    Basophils % 0.6 %    Absolute Neutrophils 5.1 1.8 - 7.8 10*9/L    Absolute Lymphocytes 0.7 (L) 1.1 - 3.6 10*9/L    Absolute Monocytes 1.2 (H) 0.3 - 0.8 10*9/L    Absolute Eosinophils 0.1 0.0 - 0.5 10*9/L    Absolute Basophils 0.0 0.0 - 0.1 10*9/L    Macrocytosis Slight (A) Not Present    Anisocytosis Slight (A) Not Present   Protein, total    Collection Time: 07/20/20 8:35 AM   Result Value Ref Range    Total Protein 6.8 5.7 - 8.2 g/dL   Comprehensive Metabolic Panel    Collection Time: 07/20/20  1:46 PM   Result Value Ref Range    Sodium 134 (L) 135 - 145 mmol/L    Potassium 4.2 3.4 - 4.8 mmol/L    Chloride 101 98 - 107 mmol/L    Anion Gap 15 (H) 5 - 14 mmol/L    CO2 18.0 (L) 20.0 - 31.0 mmol/L    BUN 60 (H) 9 - 23 mg/dL    Creatinine 8.11 (H) 0.60 - 0.80 mg/dL    BUN/Creatinine Ratio 24     EGFR CKD-EPI Non-African American, Female 18 (L) >=60 mL/min/1.44m2    EGFR CKD-EPI African American, Female 21 (L) >=60 mL/min/1.70m2    Glucose 101 70 - 179 mg/dL    Calcium 8.6 (L) 8.7 - 10.4 mg/dL    Albumin 3.0 (L) 3.4 - 5.0 g/dL    Total Protein 6.0 5.7 - 8.2 g/dL    Total Bilirubin 0.7 0.3 - 1.2 mg/dL    AST 60 (H) <=91 U/L    ALT 42 10 - 49 U/L    Alkaline Phosphatase 350 (H) 46 - 116 U/L   CBC    Collection Time: 07/20/20  1:46 PM   Result Value Ref Range    WBC 6.7 3.6 - 11.2 10*9/L    RBC 2.37 (L) 3.95 - 5.13 10*12/L    HGB 9.2 (L) 11.3 - 14.9 g/dL    HCT 47.8 (L) 29.5 - 44.0 %    MCV 107.5 (H) 77.6 - 95.7 fL    MCH 38.7 (H) 25.9 - 32.4 pg    MCHC 36.0 32.0 - 36.0 g/dL    RDW 62.1 (H) 30.8 - 15.2 %    MPV 7.8 6.8 - 10.7 fL    Platelet 184 150 - 450 10*9/L   Lactate, Venous, Whole Blood    Collection Time: 07/20/20  1:46 PM   Result Value Ref Range    Lactate, Venous 2.1 (H) 0.5 - 1.8 mmol/L   Magnesium Level    Collection Time: 07/20/20  1:46 PM   Result Value Ref Range  Magnesium 1.3 (L) 1.6 - 2.6 mg/dL   Phosphorus Level    Collection Time: 07/20/20  1:46 PM   Result Value Ref Range    Phosphorus 5.9 (H) 2.4 - 5.1 mg/dL   Basic metabolic panel    Collection Time: 07/21/20  3:38 AM   Result Value Ref Range    Sodium 134 (L) 135 - 145 mmol/L    Potassium 3.6 3.4 - 4.8 mmol/L    Chloride 105 98 - 107 mmol/L    CO2 20.0 20.0 - 31.0 mmol/L    Anion Gap 9 5 - 14 mmol/L    BUN 48 (H) 9 - 23 mg/dL    Creatinine 1.61 (H) 0.60 - 0.80 mg/dL    BUN/Creatinine Ratio 23     EGFR CKD-EPI Non-African American, Female 23 (L) >=60 mL/min/1.49m2    EGFR CKD-EPI African American, Female 26 (L) >=60 mL/min/1.34m2    Glucose 89 70 - 179 mg/dL    Calcium 8.2 (L) 8.7 - 10.4 mg/dL   CBC    Collection Time: 07/21/20  3:38 AM   Result Value Ref Range    WBC 6.2 3.6 - 11.2 10*9/L    RBC 2.28 (L) 3.95 - 5.13 10*12/L    HGB 8.4 (L) 11.3 - 14.9 g/dL    HCT 09.6 (L) 04.5 - 44.0 %    MCV 109.2 (H) 77.6 - 95.7 fL    MCH 36.9 (H) 25.9 - 32.4 pg    MCHC 33.8 32.0 - 36.0 g/dL    RDW 40.9 (H) 81.1 - 15.2 %    MPV 7.8 6.8 - 10.7 fL    Platelet 156 150 - 450 10*9/L   Magnesium Level    Collection Time: 07/21/20  3:38 AM   Result Value Ref Range    Magnesium 2.2 1.6 - 2.6 mg/dL   Phosphorus Level    Collection Time: 07/21/20  3:38 AM   Result Value Ref Range    Phosphorus 4.3 2.4 - 5.1 mg/dL   Lactate, Venous, Whole Blood    Collection Time: 07/21/20  7:20 AM   Result Value Ref Range    Lactate, Venous 2.4 (H) 0.5 - 1.8 mmol/L

## 2020-07-21 NOTE — Unmapped (Addendum)
VSS. Pt arrived @ approximately 1200. Reports no pain this shift. Tolerates reg diet well and was started on a calorie count this afternoon. Pt requires assistance ambulating and using the restroom. UA and GI not able to be collected at this time. Pt reported itchiness and rash on inner thighs/legs, PRN hydrocortisone cream applied. Went down to CT this afternoon. Free from falls and other injuries. Will continue to monitor.    Problem: Adult Inpatient Plan of Care  Goal: Plan of Care Review  Outcome: Progressing  Goal: Patient-Specific Goal (Individualized)  Outcome: Progressing  Goal: Absence of Hospital-Acquired Illness or Injury  Outcome: Progressing  Intervention: Identify and Manage Fall Risk  Recent Flowsheet Documentation  Taken 07/20/2020 1300 by Steele Berg, RN  Safety Interventions:  ??? fall reduction program maintained  ??? low bed  ??? nonskid shoes/slippers when out of bed  Intervention: Prevent Skin Injury  Recent Flowsheet Documentation  Taken 07/20/2020 1300 by Steele Berg, RN  Skin Protection:  ??? adhesive use limited  ??? tubing/devices free from skin contact  Intervention: Prevent and Manage VTE (Venous Thromboembolism) Risk  Recent Flowsheet Documentation  Taken 07/20/2020 1300 by Steele Berg, RN  Activity Management: activity adjusted per tolerance  Goal: Optimal Comfort and Wellbeing  Outcome: Progressing  Goal: Readiness for Transition of Care  Outcome: Progressing  Goal: Rounds/Family Conference  Outcome: Progressing     Problem: Impaired Wound Healing  Goal: Optimal Wound Healing  Outcome: Progressing  Intervention: Promote Wound Healing  Recent Flowsheet Documentation  Taken 07/20/2020 1300 by Steele Berg, RN  Activity Management: activity adjusted per tolerance

## 2020-07-22 LAB — CBC
HEMATOCRIT: 23.3 % — ABNORMAL LOW (ref 34.0–44.0)
HEMOGLOBIN: 8.1 g/dL — ABNORMAL LOW (ref 11.3–14.9)
MEAN CORPUSCULAR HEMOGLOBIN CONC: 34.9 g/dL (ref 32.0–36.0)
MEAN CORPUSCULAR HEMOGLOBIN: 38 pg — ABNORMAL HIGH (ref 25.9–32.4)
MEAN CORPUSCULAR VOLUME: 108.9 fL — ABNORMAL HIGH (ref 77.6–95.7)
MEAN PLATELET VOLUME: 7.9 fL (ref 6.8–10.7)
PLATELET COUNT: 134 10*9/L — ABNORMAL LOW (ref 150–450)
RED BLOOD CELL COUNT: 2.14 10*12/L — ABNORMAL LOW (ref 3.95–5.13)
RED CELL DISTRIBUTION WIDTH: 16.9 % — ABNORMAL HIGH (ref 12.2–15.2)
WBC ADJUSTED: 4 10*9/L (ref 3.6–11.2)

## 2020-07-22 LAB — BASIC METABOLIC PANEL
ANION GAP: 11 mmol/L (ref 5–14)
BLOOD UREA NITROGEN: 41 mg/dL — ABNORMAL HIGH (ref 9–23)
BUN / CREAT RATIO: 22
CALCIUM: 8 mg/dL — ABNORMAL LOW (ref 8.7–10.4)
CHLORIDE: 106 mmol/L (ref 98–107)
CO2: 19 mmol/L — ABNORMAL LOW (ref 20.0–31.0)
CREATININE: 1.88 mg/dL — ABNORMAL HIGH
EGFR CKD-EPI AA FEMALE: 30 mL/min/{1.73_m2} — ABNORMAL LOW (ref >=60)
EGFR CKD-EPI NON-AA FEMALE: 26 mL/min/{1.73_m2} — ABNORMAL LOW (ref >=60)
GLUCOSE RANDOM: 93 mg/dL (ref 70–179)
POTASSIUM: 3.2 mmol/L — ABNORMAL LOW (ref 3.4–4.8)
SODIUM: 136 mmol/L (ref 135–145)

## 2020-07-22 LAB — MAGNESIUM: MAGNESIUM: 1.7 mg/dL (ref 1.6–2.6)

## 2020-07-22 LAB — PHOSPHORUS: PHOSPHORUS: 3.5 mg/dL (ref 2.4–5.1)

## 2020-07-22 LAB — LACTATE, VENOUS, WHOLE BLOOD: LACTATE BLOOD VENOUS: 1.7 mmol/L (ref 0.5–1.8)

## 2020-07-22 MED ORDER — MULTIVITAMIN TABLET
ORAL_TABLET | Freq: Every day | ORAL | 2 refills | 120 days | Status: CP
Start: 2020-07-22 — End: 2021-07-22

## 2020-07-22 MED ADMIN — atorvastatin (LIPITOR) tablet 10 mg: 10 mg | ORAL

## 2020-07-22 MED ADMIN — heparin (porcine) 5,000 unit/mL injection 5,000 Units: 5000 [IU] | SUBCUTANEOUS | @ 12:00:00 | Stop: 2020-07-22

## 2020-07-22 MED ADMIN — melatonin tablet 3 mg: 3 mg | ORAL

## 2020-07-22 MED ADMIN — mirtazapine (REMERON) tablet 7.5 mg: 7.5 mg | ORAL

## 2020-07-22 MED ADMIN — ARIPiprazole (ABILIFY) tablet 10 mg: 10 mg | ORAL | @ 12:00:00 | Stop: 2020-07-22

## 2020-07-22 MED ADMIN — venlafaxine (EFFEXOR-XR) 24 hr capsule 300 mg: 300 mg | ORAL | @ 12:00:00 | Stop: 2020-07-22

## 2020-07-22 MED ADMIN — latanoprost (XALATAN) 0.005 % ophthalmic solution 1 drop: 1 [drp] | OPHTHALMIC

## 2020-07-22 MED ADMIN — pantoprazole (PROTONIX) EC tablet 20 mg: 20 mg | ORAL | @ 12:00:00 | Stop: 2020-07-22

## 2020-07-22 MED ADMIN — heparin (porcine) 5,000 unit/mL injection 5,000 Units: 5000 [IU] | SUBCUTANEOUS

## 2020-07-22 MED ADMIN — acetaminophen (TYLENOL) tablet 650 mg: 650 mg | ORAL

## 2020-07-22 NOTE — Unmapped (Signed)
Alert,oriented x3 on this shift.VSS/afebrile.Noted to have diminished uo but pt claimed to have chronic incontinence and been wearing depends underwear.Had changed  twice since at the beginning of the shift and bot had been mildly soaked.Denies nausea after eating ordered dinner.Reviewed safety concerns with pt.Call bell within easy reach and bed alarm on.Will monitor  Problem: Adult Inpatient Plan of Care  Goal: Plan of Care Review  Outcome: Ongoing - Unchanged  Goal: Patient-Specific Goal (Individualized)  Outcome: Ongoing - Unchanged  Goal: Absence of Hospital-Acquired Illness or Injury  Outcome: Ongoing - Unchanged  Intervention: Prevent Skin Injury  Recent Flowsheet Documentation  Taken 07/21/2020 1900 by Olam Idler, RN  Skin Protection:   tubing/devices free from skin contact   protective footwear used  Intervention: Prevent and Manage VTE (Venous Thromboembolism) Risk  Recent Flowsheet Documentation  Taken 07/21/2020 1900 by Olam Idler, RN  Activity Management: activity adjusted per tolerance  Goal: Optimal Comfort and Wellbeing  Outcome: Ongoing - Unchanged  Goal: Readiness for Transition of Care  Outcome: Ongoing - Unchanged  Goal: Rounds/Family Conference  Outcome: Ongoing - Unchanged     Problem: Impaired Wound Healing  Goal: Optimal Wound Healing  Outcome: Ongoing - Unchanged  Intervention: Promote Wound Healing  Recent Flowsheet Documentation  Taken 07/21/2020 1900 by Olam Idler, RN  Activity Management: activity adjusted per tolerance     Problem: Self-Care Deficit  Goal: Improved Ability to Complete Activities of Daily Living  Outcome: Ongoing - Unchanged     Problem: Fall Injury Risk  Goal: Absence of Fall and Fall-Related Injury  Outcome: Ongoing - Unchanged

## 2020-07-22 NOTE — Unmapped (Signed)
VSS. A&O x4. Pt denied pain. PAC removed. Voiding. No BM. Ambulating in room, remained free from fall and injuries. Tolerating reg diet. Discharge teaching completed with pt. All questions answered.     Problem: Adult Inpatient Plan of Care  Goal: Plan of Care Review  Outcome: Resolved  Goal: Patient-Specific Goal (Individualized)  Outcome: Resolved  Goal: Absence of Hospital-Acquired Illness or Injury  Outcome: Resolved  Intervention: Identify and Manage Fall Risk  Recent Flowsheet Documentation  Taken 07/22/2020 0715 by Carmell Austria, RN  Safety Interventions:   fall reduction program maintained   environmental modification   bed alarm   lighting adjusted for tasks/safety   low bed   nonskid shoes/slippers when out of bed  Goal: Optimal Comfort and Wellbeing  Outcome: Resolved  Goal: Readiness for Transition of Care  Outcome: Resolved  Goal: Rounds/Family Conference  Outcome: Resolved     Problem: Impaired Wound Healing  Goal: Optimal Wound Healing  Outcome: Resolved     Problem: Self-Care Deficit  Goal: Improved Ability to Complete Activities of Daily Living  Outcome: Resolved     Problem: Fall Injury Risk  Goal: Absence of Fall and Fall-Related Injury  Outcome: Resolved  Intervention: Promote Injury-Free Environment  Recent Flowsheet Documentation  Taken 07/22/2020 0715 by Carmell Austria, RN  Safety Interventions:   fall reduction program maintained   environmental modification   bed alarm   lighting adjusted for tasks/safety   low bed   nonskid shoes/slippers when out of bed

## 2020-07-22 NOTE — Unmapped (Signed)
Gynecology Physician Discharge Summary    Admit Date: 07/20/2020    Discharge Date: 07/22/2020     Discharge to: Home    Discharge Service: Gynecology (GYN)    Discharge Attending Physician: Verlin Dike, MD    Discharge Diagnoses:   Principal Problem:    Failure to thrive in adult  Active Problems:    Adjustment disorder with anxiety       Procedures: None    Hospital Course: Lisa Burton is a 75 y.o. 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 Her hospital course is outlined by the pertinent problems below:     ONC: Stage IIIC HG Carcinoma of Fallopian Tube   The patient is followed by her primary oncologist, Dr. Duard Brady. Diagnosis of stage IIIC HG Carcinoma of fallopian tube was on 07/01/17, after which she received chemotherapy from 07/18/17-11/17/17 with carboplatin/paclitaxel. Recurrent disease was detected on 08/25/18, after which she received additional chemotherapy, most recently on 06/12/20 with cyclophosphamide, pembrolizumab, and bevacizumab. Most recent imaging was a CT chest, abdomen, pelvis, notable for a pelvic mass, mass abutting the vaginal cuff, hydroureteronephrosis, and innumerable hepatic lesions with central necrosis. Her last Ca-125 was 100 on 07/07/20.     Diarrhea, Concerning for Pembro Colitis, Abdominal Pain   The patient had worsening diarrhea from her last chemotherapy session, which improved with prednisnone. However, her symptoms worsened within a day or two of returning home. On admission, she was afebrile and her white blood count was 7.2, and lactate 2.1. She remained afebrile throughout admission. She had no additional diarrhea during her admission.      Failure to Thrive   The patient had notably low food intake and decreased appetite prior to admission. She continued taking her home medications of zofran/reglan as needed in addition to omeprazole and remeron. Her albumin on admission was 3.0. Nutrition and palliative care were following as needed. She tolerated a full regular diet throughout her admission.     Cancer Related Pain   The patient took tylenol and oxycontin as needed.     Acute Kidney Injury on Chronic Kidney Disease - Hypomagnesemia   The patient's creatinine level was 2.17 on admission, which was elevated from her baseline of 1.5-1.7. Creatinine levels continued to downtrend appropriately towards her baseline as her PO intake improved. Electrolyte derangements were corrected as indicated.     Tremor, bilateral  Patient had complaints of tremor present when moving or doing fine motor tasks, which started a few months ago. Neurology was consulted and completed workup labs: ammonia was elevated to 58 and B12, folate, homocystine and TSH were normal. B1 and MMA were pending at time of discharge. Neurology deemed no treatment at this time with plan for outpatient follow up with [facility] if additional labs returned abnormal.     Hypertension - Hyperlipidemia   The patient continued to take atorvastatin, and has not taken lisinopril for months.     On HD#3/(07/22/20) she was deemed stable from a Gyn Onc standpoint. She was in good pain control on regimen at discharge, ambulating without difficulty, voiding spontaneously, and tolerating regular diet/drink without nausea/vomiting. She is scheduled for a follow-up with Dr. Duard Brady on 07/24/2020.        Condition at Discharge: stable    Discharge Day Services:  The patient was seen by the primary team, and deemed ready for discharge.  BP 118/72  - Pulse 74  -  Temp 37.1 ??C (Oral)  - Resp 18  - Ht 154.9 cm (5' 1)  - Wt 56.7 kg (125 lb)  - SpO2 98%  - BMI 23.62 kg/m??     Gen: NAD, awake, alert, oriented  CV: regular rate and rhythm, S1 S2  Pulm: clear to auscultation bilaterally, normal work of breathing  Abd: Soft, non-tender, non-distended, bowel sounds present  GU: deferred  Ext: 1+ edema bilateral LE to the knees   ??  Exam Per: Dr. Fredricka Bonine      Discharge Medications:      Your Medication List      START taking these medications    nystatin 100,000 unit/mL suspension  Commonly known as: MYCOSTATIN  Take 5 mL (500,000 Units total) by mouth four (4) times a day.        CONTINUE taking these medications    acetaminophen 325 MG tablet  Commonly known as: TYLENOL  Take 2 tablets (650 mg total) by mouth every four (4) hours as needed.     alendronate 70 MG tablet  Commonly known as: FOSAMAX  Take 70 mg by mouth every seven (7) days.     ARIPiprazole 10 MG tablet  Commonly known as: ABILIFY  Take 10 mg by mouth daily.     atorvastatin 10 MG tablet  Commonly known as: LIPITOR  Take 1 tablet (10 mg total) by mouth nightly.     CALCIUM ORAL  Take by mouth daily.     furosemide 20 MG tablet  Commonly known as: LASIX  Take 1 tablet (20 mg total) by mouth daily for 5 days.     latanoprost 0.005 % ophthalmic solution  Commonly known as: XALATAN  Administer 1 drop to both eyes nightly.     megestroL 400 mg/10 mL (40 mg/mL) suspension  Commonly known as: MEGACE  Take 200 mg by mouth daily.     metoclopramide 10 MG tablet  Commonly known as: REGLAN  Take 1 tablet (10 mg total) by mouth Three (3) times a day as needed.     mirtazapine 7.5 MG tablet  Commonly known as: REMERON  Take 1 tablet (7.5 mg total) by mouth nightly.     omeprazole 20 MG capsule  Commonly known as: PriLOSEC  Take 1 capsule (20 mg total) by mouth daily.     oxybutynin 5 MG tablet  Commonly known as: DITROPAN  Take 1 tablet (5 mg total) by mouth Three (3) times a day.     oxyCODONE 5 MG immediate release tablet  Commonly known as: ROXICODONE  Take 1 tablet (5 mg total) by mouth every four (4) hours as needed for pain.     senna 8.6 mg tablet  Commonly known as: SENOKOT  Take 2 tablets by mouth nightly as needed for constipation.     traZODone 100 MG tablet  Commonly known as: DESYREL  Take 100 mg by mouth nightly.     venlafaxine 150 MG 24 hr capsule  Commonly known as: EFFEXOR-XR  Take 2 capsules (300 mg total) by mouth every morning.     vitamin C 1000 MG tablet  Generic drug: ascorbic acid (vitamin C)  Take 1,000 mg by mouth daily.     VITAMIN D3 25 mcg (1,000 unit) capsule  Generic drug: cholecalciferol (vitamin D3-25 mcg (1,000 unit))  Take 1,000 Units by mouth daily.        ASK your doctor about these medications    ondansetron 4 MG disintegrating tablet  Commonly known  as: ZOFRAN-ODT  Take 1 tablet (4 mg total) by mouth every eight (8) hours as needed for up to 7 days.  Ask about: Should I take this medication?     oxyCODONE 5 MG immediate release tablet  Commonly known as: ROXICODONE  Take 1 tablet (5 mg total) by mouth every four (4) hours as needed for up to 5 days.  Ask about: Should I take this medication?            Recent Test Results:   Lab Results   Component Value Date    WBC 4.0 07/22/2020    HGB 8.1 (L) 07/22/2020    HCT 23.3 (L) 07/22/2020    PLT 134 (L) 07/22/2020       Lab Results   Component Value Date    NA 136 07/22/2020    K 3.2 (L) 07/22/2020    CL 106 07/22/2020    CO2 19.0 (L) 07/22/2020    BUN 41 (H) 07/22/2020    CREATININE 1.88 (H) 07/22/2020    GLU 93 07/22/2020    CALCIUM 8.0 (L) 07/22/2020    MG 1.7 07/22/2020    PHOS 3.5 07/22/2020       Lab Results   Component Value Date    BILITOT 0.7 07/20/2020    BILIDIR <0.10 04/03/2020    PROT 6.0 07/20/2020    ALBUMIN 3.0 (L) 07/20/2020    ALT 42 07/20/2020    AST 60 (H) 07/20/2020    ALKPHOS 350 (H) 07/20/2020    GGT 15 04/27/2010       Pending Test Results:   Pending Labs     Order Current Status    Methylmalonic Acid, Quantitative In process    Vitamin B1, Whole Blood In process          Discharge Instructions:   Activity Instructions     Activity as tolerated          Diet Instructions     Discharge diet (specify)      Discharge Nutrition Therapy: Regular          Appointments which have been scheduled for you    Jul 24, 2020  9:30 AM  (Arrive by 9:15 AM)  RETURN FOLLOW UP [place] with Ermelinda Das, MD  [facility] OBGYN GYN ONCOLOGY 1ST FLR WOMENS HOSP [facility] REGION) [address]  [place] [address]  [phone]      Jul 24, 2020 11:00 AM  (Arrive by 10:30 AM)  NURSE LAB DRAW with ADULT ONC LAB  [facility] ADULT ONCOLOGY LAB DRAW STATION [place] [facility] REGION) [address]  [place] [address]  [phone]      Jul 24, 2020 12:00 PM  (Arrive by 11:30 AM)  LEVEL 180 with [employer] CHAIR 48  [place] ONCOLOGY INFUSION [facility] REGION) [address]  [place] [address]  [phone]      Jul 31, 2020  9:30 AM  (Arrive by 9:15 AM)  RETURN FOLLOW UP [place] with Ermelinda Das, MD  [facility] OBGYN GYN ONCOLOGY 1ST FLR WOMENS HOSP [facility] REGION) [address]  [place] [address]  [phone]      Aug 01, 2020  9:15 AM  (Arrive by 9:00 AM)  DH APPT with Pennie Rushing, ASOD 4TH FLR CHAIR 3  [facility] REGION) [address]  4th Floor [place] [address]  [phone]  Aug 14, 2020 11:00 AM  (Arrive by 10:30 AM)  NURSE LAB DRAW with ADULT ONC LAB  [facility] ADULT ONCOLOGY LAB DRAW STATION [place] [facility] REGION) [address]  [place] [address]  [phone]      Aug 14, 2020 12:00 PM  (Arrive by 11:30 AM)  LEVEL 150 with [employer] CHAIR 50  [place] ONCOLOGY INFUSION [place] [facility] REGION) [address]  [place] [address]  [phone]      Sep 04, 2020 11:00 AM  (Arrive by 10:30 AM)  NURSE LAB DRAW with ADULT ONC LAB  [facility] ADULT ONCOLOGY LAB DRAW STATION [facility] REGION) [address]  [place] [address]  [phone]      Sep 04, 2020 12:00 PM  (Arrive by 11:30 AM)  LEVEL 150 with [employer] CHAIR 50  [place] ONCOLOGY INFUSION [place] [facility] REGION) [address]  [place] [address]  [phone]      Sep 25, 2020 11:30 AM  (Arrive by 11:00 AM)  NURSE LAB DRAW with ADULT ONC LAB  [facility] ADULT ONCOLOGY LAB DRAW STATION [place] [facility] REGION) [address]  [place] [address]  [phone]      Sep 25, 2020 12:30 PM  (Arrive by 12:00 PM)  LEVEL 150 with [employer] CHAIR 41  [place] ONCOLOGY INFUSION [facility] REGION) [address]  [place] [address]  [phone]      Nov 06, 2020 11:30 AM  (Arrive by 11:00 AM)  NURSE LAB DRAW with ADULT ONC LAB  [facility] ADULT ONCOLOGY LAB DRAW STATION [place] [facility] REGION) [address]  [place] [address]  [phone]      Nov 06, 2020 12:30 PM  (Arrive by 12:00 PM)  LEVEL 150 with [employer] CHAIR 41  [place] ONCOLOGY INFUSION [place] [facility] REGION) [address]  [place] [address]  [phone]      Nov 09, 2020 11:40 AM  (Arrive by 11:25 AM)  OFFICE VISIT with Marca Ancona, MD  [facility]) [address] [place] [address]  [phone]      Jan 10, 2021 12:40 PM  (Arrive by 12:25 PM)  RETURN  ENDOCRINE with Leandra Kern, MD  [facility] DIABETES AND ENDOCRINOLOGY [place] [facility] REGION) [address]  [place] [address]  [phone]           Scribe's Attestation: Hedy Camara, MD obtained and performed the history, physical exam and medical decision making elements that were entered into the chart. Documentation assistance was provided by me personally, a scribe. Signed by Lorenda Cahill, Scribe, on July 22, 2020 at 8:28 AM.         Resident Attestation: I saw this patient and performed the physical exam and history-taking with assistance in documentation from the above-mentioned scribe. Hedy Camara, MD

## 2020-07-22 NOTE — Unmapped (Shared)
GYN ONC PROGRESS NOTE    Assessment and Plan:  Lisa Burton is a 75 y.o. 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  - 09/04/18-06/07/19: doxil/carboplatin; 08/20/19-03/13/20: niraparib; 04/20/20-05/26/20: olaparib   - 05/15/20: CT with innumerable hypoattenuating lesions throughout liver, chronic fracture deformity of proximal L superior pubic ramus, enlarging/new peritoneal implants abutting small bowel mesentery measuring 1.3 and 0.9cm   - 06/12/20: cyclophosphamide/pembrolizumab/bevacizumab   - 07/06/20: CT c/a/p with 5.9 x 5.3 x 4.8cm heterogenously enhancing pelvic mass, slightly hypoattenuating peripherally enhancing 3.0 x 2.8 x 2.9cm mass abutting vaginal cuff. ??Moderate L hydroureteronephrosis (similar to prior). Lower abdominal omental implants, enlarged from prior. L pelvic side wall LN + L external iliac node, both enlarged. Innumerable hepatic lesions with central necrosis, increased in size. Extrinsic compression of hepatic IVC + hepatic veins, intrahepatic IVC significantly stenotic c/f possible infiltration. Trace ascites   - Last Ca-125 (07/07/20): 100     Diarrhea c/f pembro colitis, abdominal pain  - 3/21 Last chemo, worsening symptoms since then, briefly improved during last admission w/ administration of prednisone, but acutely worsened a day or two after returning home  - Admit: Afebrile, WBC 7.2 > 6.2, Lactate 2.1 > repeat 2.4, 2.3   - No BM since admission, GIPP ordered if BM occurs    FTT  - Inability to PO intake d/t decreased appetite/desire.   - Cont home meds: Zofran/Reglan PRN, omeprazole, Remeron    - Albumin 3.0  - Nutrition following   - 4/8 UA w/ 7 WBC, occasional bacteria, rare mucus     AKI on CKD -??Hypomagnesemia   - Baseline range from Cr 1.5-1.7 > 2.17 (4/6) > 2.55 (4/7) >> 1.88 this AM  - Medlocked  - avoid nephrotoxins   - Mag 1.4 > repletion > 1.7 this AM  - Replete lytes PRN   - BMP: AST/ALT 60/42 (4/7), Alk phos 350     Tremor, bilateral  - Ammonia 58  - S/p Neuro c/s (4/8): Recommend lab workup, Dietician consult, no indication for treatment. Can follow up with [facility] Neuro outpatient.    - Lab workup: B12 nml, Folate nml, Homocystine nml, TSH nml, B1, MMA pending      Anemia  - Hgb 9.2 > 8.4 (4/7)  - Continue PPI    Cancer related pain   - Current: Tyl, oxy PRN     OAB ??   - Oxybutynin 5mg  QID > self d/c'd   ??   Osteoporosis ??   - Hx chronic pubic ramus fracture ??  ??  HTN - HLD ??  - Atorvastatin 10mg  daily  - Off lisinopril for months  ??  ADHD/MDD/Borderline PD  - Abilify 10mg  daily   - Effexor 300mg  daily  - Mirtazapine 7.5 mg nightly   - Per Psych consult (4/8), will start thiamine 100 mg and folate, multivitamin supplementation. Will follow up with Dr. Dallas Schimke outpatient.     PPX:  - Bowel regimen and antiemetics prn  - SCDs, heparin, ambulation    Dispo: Floor  -  PT/OT Discharge Recs: 3-5x weekly    Patient seen and plan discussed with Dr. Fredricka Bonine. Attending Dr. Reyne Dumas was immediately available.    Subjective:   Patient is having chronic incontinence and wearing Depends underwear. She has not had bowel movement. She is doing well this morning, denies pain. She feels ready for discharge now.     Objective:  Temp:  [36.3 ??C (97.3 ??F)-37.1 ??C (98.7 ??F)] 37.1 ??C (98.7 ??F)  Heart Rate:  [77-88] 77  Resp:  [16] 16  BP: (96-113)/(67-86) 106/69  SpO2:  [96 %-100 %] 100 %    Gen: NAD, awake, alert, oriented  CV: regular rate and rhythm, S1 S2  Pulm: clear to auscultation bilaterally, normal work of breathing  Abd: Soft, non-tender, non-distended, bowel sounds present  GU: deferred  Ext: 1+ edema bilateral LE to the knees     Exam Per: Dr. Fredricka Bonine     Medications (scheduled)   ??? ARIPiprazole  10 mg Oral Daily   ??? atorvastatin  10 mg Oral Nightly   ??? heparin (porcine) for subcutaneous use  5,000 Units Subcutaneous Q12H [facility]   ??? latanoprost  1 drop Both Eyes Nightly   ??? mirtazapine  7.5 mg Oral Nightly   ??? pantoprazole  20 mg Oral Daily   ??? venlafaxine  300 mg Oral QAM       Medications (prn)  acetaminophen, calcium carbonate, hydrocortisone, melatonin, metoclopramide    Labs:   Lab Results   Component Value Date    WBC 6.2 07/21/2020    HGB 8.4 (L) 07/21/2020    HCT 24.8 (L) 07/21/2020    PLT 156 07/21/2020       Lab Results   Component Value Date    NA 134 (L) 07/21/2020    K 3.6 07/21/2020    CL 105 07/21/2020    CO2 20.0 07/21/2020    BUN 48 (H) 07/21/2020    CREATININE 2.10 (H) 07/21/2020    GLU 89 07/21/2020    CALCIUM 8.2 (L) 07/21/2020    MG 2.2 07/21/2020    PHOS 4.3 07/21/2020       Lab Results   Component Value Date    BILITOT 0.7 07/20/2020    BILIDIR <0.10 04/03/2020    PROT 6.0 07/20/2020    ALBUMIN 3.0 (L) 07/20/2020    ALT 42 07/20/2020    AST 60 (H) 07/20/2020    ALKPHOS 350 (H) 07/20/2020    GGT 15 04/27/2010       Lab Results   Component Value Date    INR 1.0 01/16/2011    APTT 30.2 01/16/2011     Scribe's Attestation: Hedy Camara, MD obtained and performed the history, physical exam and medical decision making elements that were entered into the chart. Documentation assistance was provided by me personally, a scribe. Signed by Lorenda Cahill, Scribe, on July 22, 2020 at 6:25 AM.         {*** NOTE TO PROVIDER: PLEASE ADD ATTESTATION NOTING YOU AGREE WITH SCRIBE DOCUMENTATION. For template- Go to smartphrases --> Moon, Y --> [email or web address]}

## 2020-07-23 NOTE — Unmapped (Signed)
GYNECOLOGIC ONCOLOGY RETURN VISIT        ASSESSMENT AND PLAN:  Lisa Burton 75 y.o. 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  She completed 6 cycles of paclitaxel and carboplatin in August 2019.  She was not able to go on the ATHENA trial as everything could not be completed in a timely enough fashion..  Her preoperative CA-125 was 149 and prior to cycle #6 it was 5.6.    She unfortunately suffered a recurrence in 08/25/18 and she is s/p 9 cycles of carboplatin and liposomal doxorubicin. She has subcm disease. We discussed moving forward with niraparib as a maintenance strategy and she wished to proceed. She did well for a period of time but unfortunately has recurrent disease in January 2022. She did have a difficult time with that liposomal doxorubicin and the platinum. In an effort to extend her platinum free interval we did discuss moving forward with Cytoxan, pembrolizumab and bevacizumab. She has been hospitalized twice for FTT but wants to continue therapy.    However, due to the progressive disease on imaging we may need to consider changing therapy.  However, at the time of her last CT scan in March, she had just had her second cycle of chemo.  We will move forward with continuing pembrolizumab, bevacizumab and Cytoxan.  We will need to proceed with repeat imaging in early May after 2 more cycles.  We will check a Ca125 with each of her cycles.    We talked at length today about the support she has at home.  She has neighbors that are going to walk with her every day.  We talked about her edema and I do think that she would benefit from a hospital bed she does not want a proceed with this.  She does have someone that she is hired to help with meal prep.  She has a housekeeper that comes every Monday, and she also has someone else who does laundry and dishes so she does not feel that she needs any additional support.  Physical therapy is good to be coming to the house.  We will send them a message to see if they could fit her for some compression stockings.  I think her edema is secondary to her hypoalbuminemia.    We again reviewed her medication list.  She has not been taking her Cytoxan.  She was not provided this in the hospital so she did not think she needed to take it.  She is not sure where her prescription is so we will have one resent.    I reviewed her medicine list with Hermelinda Medicus today.    I personally spent 25 minutes face-to-face and non-face-to-face in the care of this patient, which includes all pre, intra, and post visit time on the date of service.      CC:   Chief Complaint   Patient presents with   ??? Follow-up       HISTORY:  Lisa Burton is a 75 y.o. woman who presented with pelvic mass. She initially presented with abdominal pain and a CT was concerning for fluid collections versus bowel. A repeat image was done 3 months later which was concerning for possible GYN malignancy. It demonstrated trace perihepatic ascites, diffuse omental stranding, and bilateral ovarian cystic lesions suspicious for ovarian carcinoma given there nodularity, loculations and calcifications.     Preop Labs and Imaging:  CT 06/16/17:  The aorta, celiac, SMA, and IMA are patent. There is moderate narrowing at the origin of the celiac and SMA. Trace perihepatic ascites is identified, new from prior examination. Diffuse omental stranding is seen. No pathologically enlarged adenopathy is identified by CT size criteria.    Uterus is anteverted with focal calcifications identified within the right lateral myometrium. Bilateral ovarian cystic lesions are identified. On the left, there is a 3 cm cystic lesion with mural nodularity along the medial wall and focal calcifications along the posterior wall (2:58). On the right, there is a multiloculated 3.6 cm nodule with mildly thickened walls and layering hyperdense material (2:58). Calcifications are also identified along the wall of the lesion. Adjacent loculated fluid is identified posterior to the right ovary with peritoneal thickening along the posterior cul-de-sac.     ??   Impression ?? ??   Findings compatible with ovarian carcinoma with bilateral suspicious appearing ovarian lesions, ascites, and peritoneal thickening, predominantly within the posterior cul-de-sac, concerning for metastatic spread.   ??  06/17/17: CA 125 129  ??  Procedure on 07/01/17: Diagnostic laparoscopy, conversion to exploratory laparotomy, tumor debulking with total hysterectomy, bilateral salpingo-oophorectomy, infragastric omentectomy, Argon beam ablation of tumor nodules, enterolysis  ??  Operative Findings: On diagnostic laparoscopy, Fagotti score 6??(Diaphragmatic disease, Omental disease, Liver disease) though disease appeared small volume in the upper abdomen and primarily pelvic. ??Ascites present. Optimal debulking was felt feasible. After laparotomy, 6x4cm plaque in the portahepatis noted which was not debulkable (argon laser coagulated). We noted a scant miliary disease along diaphragm, peritoneum, small bowel mesentery (residual). Ovaries densely adherent to multiple adherent redundant loops of rectosigmoid colon (no evidence of obstruction), and posterior uterus. Thin plaque of tumor along rectosigmoid colon and 3x3cm nodule at base of rectosigmoid mesentery(residual). Tumor nodules along the bladder reflection (resected). ??No other disease noted on full abdominopelvic survey. Optimal debulking would have required debulking the porta hepatis, rectosigmoid resection, peritoneal stripping in many locations. IOFS high grade serous ovarian cancer.??R2 resection.  ??  Pathology:  Final Diagnosis   A: Uterus, cervix, bilateral tubes and ovaries, hysterectomy, bilateral salpingo-oophorectomy  ??  Ovaries and fallopian tubes, bilateral:  - Serous carcinoma involving bilateral ovaries  - Serous carcinoma involving surface of right fallopian tube  - Left fallopian tube with serous tubal intraepithelial carcinoma and invasive carcinoma  - FIGO Stage IIIC; see synoptic report   ??  Cervix:   - Benign ectocervix and endocervix with Nabothian cysts and polyp  - No dysplasia or malignancy identified  ??  Endometrium:  - Inactive endometrium.  - No hyperplasia, atypia, or malignancy identified  ??  Myometrium:  - Involved by serous carcinoma  - Benign leiomyomata measuring up to 0.3 cm  ??????????- No atypia, increased mitotic activity or tumor cell necrosis identified  ??  B: Omentum  - Involved by serous carcinoma, multiple nodules, largest 0.6 cm  - No tumor seen in 1 lymph node (0/1)     12/08/17 CT:  Impression     History of ovarian cancer status post total abdominal hysterectomy, bilateral salpingo-oophorectomy and omentectomy with stable peritoneal nodularity along the posterior right liver margin. Otherwise, no new sites of peritoneal disease or new metastases in the abdomen or pelvis.       12/26/17 PET:   Impression     - No evidence of metabolically active foci to suggest recurrent or metastatic disease. Specifically, there was no associated avidity with the posterior right liver margin peritoneal nodules.  She was going to go on the ATHENA trial but everything could not be accomplished in a timely fashion for her to meet the enrollment.    INTERVAL HISTORY:  She had a recurrence in May 2020.  She has been treated on liposomal doxorubicin and carboplatin.  She is status post cycle #9 on June 04, 2019. CA-125 5.8 3/21. She was then placed on niraparib and then changed to olaparib due to toxicity.    She comes in today for follow-up. She has been hospitalized twice recently for diarrhea, which we have not been able to document while in house and FTT. She and I discussed GOC on Friday when she was in the hospital and she stated not ready to stop, I want to keep fighting. She is DNR/DNI.    CT 07/06/20:  ??  IMPRESSION:  1.Status post hysterectomy and bilateral salpingo-oophorectomies. Conglomerate heterogeneously enhancing pelvic mass which is in close proximity to surgical bed and abutting the vaginal cuff. It is difficult to determine if this mass arises from the adjacent sigmoid colon given obliteration of fat planes. Differential includes local recurrence vs new primary sigmoid malignancy. Wall thickening of the rectosigmoid colon is likely reactive or may represent mild colitis  2.Innumerable hypoattenuating bilobar hepatic lesions, some of  which demonstrate central necrosis and are  increased in size from prior with several lesions concerning for worsening metastasis.  3.Extrinsic compression of the above masses on the hepatic IVC and hepatic veins which are diminutive. The intrahepatic IVC is significantly stenotic with nonvisualization of a short segment , and infiltration is difficult to exclude. The right portal vein is also diminutive  4.Trace ascites, new from prior. Interval enlargement of pelvic and pelvic implants concerning for worsening carcinomatosis.  5.Delayed left nephrogram likely related to obstructive uropathy secondary to obstruction from the pelvic mass with. moderate left hydroureteronephrosis. The hydroureteronephrosis is similar to prior study but was not well characterized without contrast.  6.Somewhat patchy enhancement of the right kidney, now bladder wall thickening and urothelial enhancement of the distal left ureter. This is likely reactive given bland urinalysis, less likely ascending infection or UTI/pyelonephritis  7.Additional incidental findings as described above  8.Refer to same-day CT chest for evaluation above the diaphragm.    CA-125 100. Head CT was negative.    She states that she has been doing great since she got home.  She states she ate very well over the weekend and felt well.  She did show me her lower extremities and it looks like her edema has returned and we had a conversation about this.  We reviewed her medication list.  We talked about support at home and she feels that she does have adequate support at home to be successful.  She has not been taking her Cytoxan.  She denies any abdominal or pelvic pain.  She is had no diarrhea.    Past Medical History:   Past Medical History:   Diagnosis Date   ??? ADHD (attention deficit hyperactivity disorder)    ??? Anxiety    ??? At risk for falls    ??? Baker's cyst    ??? Fractures    ??? Hypersomnia    ??? Hypertension    ??? Injury of posterior cruciate ligament    ??? Insomnia    ??? Irritable colon 07/10/2010   ??? Joint pain    ??? Joint replaced    ??? Major depressive disorder    ??? OSA (obstructive sleep apnea)    ???  Osteoarthritis    ??? Osteoporosis    ??? Ovarian cancer (CMS-HCC) 07/03/2017   ??? Personality disorder (CMS-HCC)     Borderline Personality Disorder   ??? PLMD (periodic limb movement disorder)    ??? Tear of meniscus of knee    ??? Urge incontinence 11/12/2012   ??? Vestibular dysfunction 09/16/2013     Past Surgical History:   Past Surgical History:   Procedure Laterality Date   ??? CESAREAN SECTION  1981   ??? FINGER FRACTURE SURGERY Right 10/2013    thumb   ??? FOOT SURGERY Bilateral 9/00, 01/14    hardware   ??? IR INSERT PORT AGE GREATER THAN 5 YRS  07/04/2017    IR INSERT PORT AGE GREATER THAN 5 YRS 07/04/2017 [name], MD IMG VIR [facility]   ??? PR BIL SALP-OOPH W/OMENTECT,TAH,RAD DISSECT Bilateral 07/01/2017    Procedure: BILATERAL SALPINGO-OOPHORECTOMY W/OMENTECTOMY, TOTAL ABD HYSTERECTOMY & RADICAL DISSECTION FOR DEBULKING;  Surgeon: Ermelinda Das, MD;  Location: MAIN OR [facility];  Service: Gynecology Oncology   ??? PR LAP,DIAGNOSTIC ABDOMEN N/A 07/01/2017    Procedure: Laparoscopy, Abdomen, Peritoneum, & Omentum, Diagnostic, W/Wo Collection Specimen(S) By Brushing Or Washing;  Surgeon: Ermelinda Das, MD;  Location: MAIN OR [facility];  Service: Gynecology Oncology   ??? 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 TONSILLECTOMY  1970   ??? WRIST FRACTURE SURGERY Right 1998    wrist     Oncology History:   Oncology History   Fallopian tube cancer, carcinoma (CMS-HCC)   06/17/2017 Initial Diagnosis    CT:  Findings compatible with ovarian carcinoma with bilateral suspicious appearing ovarian lesions, ascites, and peritoneal thickening, predominantly within the posterior cul-de-sac, concerning for metastatic spread.   ??  06/17/17: CA 125 129     07/01/2017 Surgery    Procedure on 07/01/17: Diagnostic laparoscopy, conversion to exploratory laparotomy, tumor debulking with total hysterectomy, bilateral salpingo-oophorectomy, infragastric omentectomy, Argon beam ablation of tumor nodules, enterolysis    Operative Findings: On diagnostic laparoscopy, Fagotti score 6 (Diaphragmatic disease, Omental disease, Liver disease) though disease appeared small volume in the upper abdomen and primarily pelvic.  Ascites present. Optimal debulking was felt feasible. After laparotomy, 6x4cm plaque in the portahepatis noted which was not debulkable (argon laser coagulated). We noted a scant miliary disease along diaphragm, peritoneum, small bowel mesentery (residual). Ovaries densely adherent to multiple adherent redundant loops of rectosigmoid colon (no evidence of obstruction), and posterior uterus. Thin plaque of tumor along rectosigmoid colon and 3x3cm nodule at base of rectosigmoid mesentery(residual). Tumor nodules along the bladder reflection (resected).  No other disease noted on full abdominopelvic survey. Optimal debulking would have required debulking the porta hepatis, rectosigmoid resection, peritoneal stripping in many locations. 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 Consideration of bevacizumab versus enrollment in ATHENA trial as maintenance. Referral to genetics. BRCA immunostaining pending  ??     07/18/2017 - 11/17/2017 Chemotherapy    Chemotherapy Treatment Treatment Goal Curative   Line of Treatment First Line   Plan Name OP OVARIAN PACLITAXEL/CARBOPLATIN   Start Date 07/21/2017   End Date 11/17/2017   Provider Ermelinda Das, MD   Chemotherapy dexamethasone (DECADRON) tablet 12 mg, 12 mg, Oral, Once, 6 of 6 cycles  Administration: 12 mg (07/21/2017), 12 mg (08/11/2017), 12 mg (09/05/2017), 12 mg (09/29/2017), 12 mg (10/28/2017), 12  mg (11/17/2017)  CARBOplatin (PARAPLATIN) 580.8 mg in sodium chloride (NS) 0.9 % 100 mL IVPB, 580.8 mg (114.2 % of original dose 508.8 mg), Intravenous, Once, 6 of 6 cycles  Dose modification:   (original dose 508.8 mg, Cycle 1)  Administration: 580.8 mg (07/21/2017), 528.6 mg (08/11/2017), 517.8 mg (09/05/2017), 529.8 mg (09/29/2017), 493.2 mg (10/28/2017), 498 mg (11/17/2017)  PACLItaxel (TAXOL) 286.98 mg in sodium chloride 0.9% NON-PVC (NS) 0.9 % 500 mL IVPB, 175 mg/m2 = 286.98 mg, Intravenous, Once, 6 of 6 cycles  Administration: 286.98 mg (07/21/2017), 285.24 mg (08/11/2017), 276.48 mg (09/05/2017), 278.28 mg (09/29/2017), 281.76 mg (10/28/2017), 283.5 mg (11/17/2017)          01/16/2018 Genetics    [facility] panel. Genetic testing is negative.     08/25/2018 Recurrence    Elevated CA-125 and imaging consistent with recurrent disease.       09/04/2018 - 06/07/2019 Chemotherapy    OP OVARIAN DOXORUBICIN LIPOSOMAL/CARBOPLATIN  DOXOrubicin Liposome 30 mg/m2 IV on day 1, CARBOplatin AUC 5 IV on day 1, every 28 days       08/20/2019 - 03/13/2020 Chemotherapy    niraparib     04/20/2020 - 05/26/2020 Chemotherapy    olaparib     05/15/2020 Recurrence    + ct in ER with liver and peritoneal metastasis. We were not aware of the scan until 06/05/20.       06/12/2020 -  Chemotherapy    OP CERVICAL PEMBROLIZUMAB  pembrolizumab 200 mg IV on day 1, every 21 days       Family History:   Family History   Problem Relation Age of Onset   ??? Personality disorder Sister    ??? Cancer Father         GI cancer 46's   ??? Arthritis Father    ??? Depression Son    ??? Arthritis Sister    ??? Arthritis Mother    ??? ADD / ADHD Neg Hx    ??? Alcohol abuse Neg Hx    ??? Anxiety disorder Neg Hx    ??? Bipolar disorder Neg Hx    ??? Dementia Neg Hx    ??? Drug abuse Neg Hx    ??? OCD Neg Hx    ??? Paranoid behavior Neg Hx    ??? Physical abuse Neg Hx    ??? Schizophrenia Neg Hx    ??? Seizures Neg Hx    ??? Sexual abuse Neg Hx    ??? Sleep disorder Neg Hx      Social History:   Social History     Socioeconomic History   ??? Marital status: Married     Spouse name: None   ??? Number of children: 1   ??? Years of education: None   ??? Highest education level: None   Occupational History   ??? Occupation: retired IT trainer   Tobacco Use   ??? Smoking status: Never Smoker   ??? Smokeless tobacco: Never Used   Vaping Use   ??? Vaping Use: Never used   Substance and Sexual Activity   ??? Alcohol use: Yes     Alcohol/week: 1.0 - 2.0 standard drink     Types: 1 - 2 Shots of liquor per week   ??? Drug use: No   ??? Sexual activity: None   Other Topics Concern   ??? 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  Partner is somewhat older in 96's and they live together in [place]. She has a son who is a Clinical research associate, and does litigation in [place].  He went to [employer].  She also has a granddaughter only 73 year old in 2015.    12/10/16        PC[employer] Components:        Family, social, cultural characteristics: social support includes  Odiss MCGrew her partner .      Patient has the following communication needs: none, per patient     Health Literacy: How confident are you that you understand your health issues/concerns, can participate in your care, and manage your care along with your physician: confident.    Behaviors Affecting Health: none, per patient    Family history of mental health illness and/or substance abuse: mental health illness: yes, as noted in family history. Sister with personality disorder    Have you been seen by any medical provider that we have not referred you to since your last visit ? No    Discussed a Living Will with the patient and the : Patient interested in education materials about a living will. Materials provided at today's visit.      Social Determinants of Health     Financial Resource Strain: Low Risk    ??? Difficulty of Paying Living Expenses: Not hard at all   Food Insecurity: No Food Insecurity   ??? Worried About Programme researcher, broadcasting/film/video in the Last Year: Never true   ??? Ran Out of Food in the Last Year: Never true   Transportation Needs: No Transportation Needs   ??? Lack of Transportation (Medical): No   ??? Lack of Transportation (Non-Medical): No   Physical Activity: Unknown   ??? Days of Exercise per Week: 0 days   ??? Minutes of Exercise per Session: Not on file   Stress: No Stress Concern Present   ??? Feeling of Stress : Not at all   Social Connections: Socially Integrated   ??? Frequency of Communication with Friends and Family: More than three times a week   ??? Frequency of Social Gatherings with Friends and Family: More than three times a week   ??? Attends Religious Services: More than 4 times per year   ??? Active Member of Clubs or Organizations: Yes   ??? Attends Banker Meetings: More than 4 times per year   ??? Marital Status: Married     Allergy:   Allergies   Allergen Reactions   ??? Codeine Rash   ??? Sulfa (Sulfonamide Antibiotics) Rash and Nausea Only       CURRENT MEDICATIONS:  Outpatient Encounter Medications as of 07/24/2020   Medication Sig Dispense Refill   ??? acetaminophen (TYLENOL) 325 MG tablet Take 2 tablets (650 mg total) by mouth every four (4) hours as needed. 120 tablet 2   ??? alendronate (FOSAMAX) 70 MG tablet Take 70 mg by mouth every seven (7) days.     ??? ARIPiprazole (ABILIFY) 10 MG tablet Take 10 mg by mouth daily.      ??? ascorbic acid, vitamin C, (VITAMIN C) 1000 MG tablet Take 1,000 mg by mouth daily.      ??? atorvastatin (LIPITOR) 10 MG tablet Take 1 tablet (10 mg total) by mouth nightly. 90 tablet 3   ??? CALCIUM ORAL Take by mouth daily.      ??? cholecalciferol, vitamin D3, (VITAMIN D3) 1,000 unit capsule Take 1,000  Units by mouth daily.      ??? furosemide (LASIX) 20 MG tablet Take 1 tablet (20 mg total) by mouth daily for 5 days. 5 tablet 0   ??? latanoprost (XALATAN) 0.005 % ophthalmic solution Administer 1 drop to both eyes nightly.      ??? megestroL (MEGACE) 400 mg/10 mL (40 mg/mL) suspension Take 200 mg by mouth daily.     ??? metoclopramide (REGLAN) 10 MG tablet Take 1 tablet (10 mg total) by mouth Three (3) times a day as needed. 90 tablet 1   ??? mirtazapine (REMERON) 7.5 MG tablet Take 1 tablet (7.5 mg total) by mouth nightly. 90 tablet 3   ??? multivitamin (TAB-A-VITE/THERAGRAN) per tablet Take 1 tablet by mouth daily. 120 tablet 2   ??? omeprazole (PRILOSEC) 20 MG capsule Take 1 capsule (20 mg total) by mouth daily. 90 capsule 3   ??? [EXPIRED] ondansetron (ZOFRAN-ODT) 4 MG disintegrating tablet Take 1 tablet (4 mg total) by mouth every eight (8) hours as needed for up to 7 days. 30 tablet 2   ??? oxybutynin (DITROPAN) 5 MG tablet Take 1 tablet (5 mg total) by mouth Three (3) times a day. 90 tablet 3   ??? [EXPIRED] oxyCODONE (ROXICODONE) 5 MG immediate release tablet Take 1 tablet (5 mg total) by mouth every four (4) hours as needed for up to 5 days. 20 tablet 0   ??? oxyCODONE (ROXICODONE) 5 MG immediate release tablet Take 1 tablet (5 mg total) by mouth every four (4) hours as needed for pain. 25 tablet 0   ??? senna (SENOKOT) 8.6 mg tablet Take 2 tablets by mouth nightly as needed for constipation. 120 tablet 2   ??? traZODone (DESYREL) 100 MG tablet Take 100 mg by mouth nightly.     ??? venlafaxine (EFFEXOR-XR) 150 MG 24 hr capsule Take 2 capsules (300 mg total) by mouth every morning. 180 capsule 3     No facility-administered encounter medications on file as of 07/24/2020.       PHYSICAL EXAM:  BP 135/88  - Pulse 94  - Temp 36.1 ??C (97 ??F) (Temporal)  - Wt 59.7 kg (131 lb 9.6 oz)  - SpO2 100%  - BMI 24.87 kg/m?? General: Alert, oriented, no acute distress.    LABORATORY AND RADIOLOGIC STUDIES:    CA-125, prechemotherapy labs

## 2020-07-24 ENCOUNTER — Ambulatory Visit: Admit: 2020-07-24 | Discharge: 2020-07-25 | Payer: MEDICARE

## 2020-07-24 ENCOUNTER — Ambulatory Visit
Admit: 2020-07-24 | Discharge: 2020-07-25 | Payer: MEDICARE | Attending: Gynecologic Oncology | Primary: Gynecologic Oncology

## 2020-07-24 ENCOUNTER — Other Ambulatory Visit: Admit: 2020-07-24 | Discharge: 2020-07-25 | Payer: MEDICARE

## 2020-07-24 DIAGNOSIS — E038 Other specified hypothyroidism: Principal | ICD-10-CM

## 2020-07-24 DIAGNOSIS — C5700 Malignant neoplasm of unspecified fallopian tube: Principal | ICD-10-CM

## 2020-07-24 DIAGNOSIS — Z5111 Encounter for antineoplastic chemotherapy: Principal | ICD-10-CM

## 2020-07-24 LAB — CBC W/ AUTO DIFF
BASOPHILS ABSOLUTE COUNT: 0 10*9/L (ref 0.0–0.1)
BASOPHILS RELATIVE PERCENT: 0.5 %
EOSINOPHILS ABSOLUTE COUNT: 0 10*9/L (ref 0.0–0.5)
EOSINOPHILS RELATIVE PERCENT: 0.4 %
HEMATOCRIT: 27.2 % — ABNORMAL LOW (ref 34.0–44.0)
HEMOGLOBIN: 9.4 g/dL — ABNORMAL LOW (ref 11.3–14.9)
LYMPHOCYTES ABSOLUTE COUNT: 0.3 10*9/L — ABNORMAL LOW (ref 1.1–3.6)
LYMPHOCYTES RELATIVE PERCENT: 6.3 %
MEAN CORPUSCULAR HEMOGLOBIN CONC: 34.6 g/dL (ref 32.0–36.0)
MEAN CORPUSCULAR HEMOGLOBIN: 38.4 pg — ABNORMAL HIGH (ref 25.9–32.4)
MEAN CORPUSCULAR VOLUME: 111.2 fL — ABNORMAL HIGH (ref 77.6–95.7)
MEAN PLATELET VOLUME: 9 fL (ref 6.8–10.7)
MONOCYTES ABSOLUTE COUNT: 0.9 10*9/L — ABNORMAL HIGH (ref 0.3–0.8)
MONOCYTES RELATIVE PERCENT: 16.2 %
NEUTROPHILS ABSOLUTE COUNT: 4.2 10*9/L (ref 1.8–7.8)
NEUTROPHILS RELATIVE PERCENT: 76.6 %
PLATELET COUNT: 109 10*9/L — ABNORMAL LOW (ref 150–450)
RED BLOOD CELL COUNT: 2.44 10*12/L — ABNORMAL LOW (ref 3.95–5.13)
RED CELL DISTRIBUTION WIDTH: 17.5 % — ABNORMAL HIGH (ref 12.2–15.2)
WBC ADJUSTED: 5.5 10*9/L (ref 3.6–11.2)

## 2020-07-24 LAB — COMPREHENSIVE METABOLIC PANEL
ALBUMIN: 2.7 g/dL — ABNORMAL LOW (ref 3.4–5.0)
ALKALINE PHOSPHATASE: 437 U/L — ABNORMAL HIGH (ref 46–116)
ALT (SGPT): 45 U/L (ref 10–49)
ANION GAP: 9 mmol/L (ref 5–14)
AST (SGOT): 70 U/L — ABNORMAL HIGH (ref <=34)
BILIRUBIN TOTAL: 0.5 mg/dL (ref 0.3–1.2)
BLOOD UREA NITROGEN: 29 mg/dL — ABNORMAL HIGH (ref 9–23)
BUN / CREAT RATIO: 21
CALCIUM: 8 mg/dL — ABNORMAL LOW (ref 8.7–10.4)
CHLORIDE: 105 mmol/L (ref 98–107)
CO2: 21 mmol/L (ref 20.0–31.0)
CREATININE: 1.37 mg/dL — ABNORMAL HIGH
EGFR CKD-EPI AA FEMALE: 44 mL/min/{1.73_m2} — ABNORMAL LOW (ref >=60)
EGFR CKD-EPI NON-AA FEMALE: 38 mL/min/{1.73_m2} — ABNORMAL LOW (ref >=60)
GLUCOSE RANDOM: 121 mg/dL (ref 70–179)
POTASSIUM: 3.5 mmol/L (ref 3.4–4.8)
PROTEIN TOTAL: 5.6 g/dL — ABNORMAL LOW (ref 5.7–8.2)
SODIUM: 135 mmol/L (ref 135–145)

## 2020-07-24 LAB — CA 125: CA 125: 176 U/mL — ABNORMAL HIGH (ref 0–35)

## 2020-07-24 MED ORDER — CYCLOPHOSPHAMIDE 50 MG CAPSULE
ORAL_CAPSULE | Freq: Every day | ORAL | 5 refills | 30 days | Status: CP
Start: 2020-07-24 — End: ?
  Filled 2020-07-25: qty 30, 30d supply, fill #0

## 2020-07-24 MED ADMIN — heparin, porcine (PF) 100 unit/mL injection 500 Units: 500 [IU] | INTRAVENOUS | @ 19:00:00 | Stop: 2020-07-25

## 2020-07-24 MED ADMIN — sodium chloride (NS) 0.9 % infusion: 100 mL/h | INTRAVENOUS | @ 17:00:00

## 2020-07-24 MED ADMIN — bevacizumab-awwb (MVASI) 852 mg in sodium chloride (NS) 0.9 % 100 mL IVPB: 15 mg/kg | INTRAVENOUS | @ 18:00:00 | Stop: 2020-07-24

## 2020-07-24 MED ADMIN — pembrolizumab (KEYTRUDA) 200 mg in sodium chloride (NS) 0.9 % 50 mL IVPB: 200 mg | INTRAVENOUS | @ 18:00:00 | Stop: 2020-07-24

## 2020-07-24 NOTE — Unmapped (Signed)
We will move forward with your infusion today and we will do one more on May 2.  We will then get a CT scan after that subsequent one to look and see how your disease is responding.

## 2020-07-24 NOTE — Unmapped (Signed)
Hospital Outpatient Visit on 07/24/2020   Component Date Value Ref Range Status   ??? Sodium 07/24/2020 135  135 - 145 mmol/L Final   ??? Potassium 07/24/2020 3.5  3.4 - 4.8 mmol/L Final   ??? Chloride 07/24/2020 105  98 - 107 mmol/L Final   ??? Anion Gap 07/24/2020 9  5 - 14 mmol/L Final   ??? CO2 07/24/2020 21.0  20.0 - 31.0 mmol/L Final   ??? BUN 07/24/2020 29 (A) 9 - 23 mg/dL Final   ??? Creatinine 07/24/2020 1.37 (A) 0.60 - 0.80 mg/dL Final   ??? BUN/Creatinine Ratio 07/24/2020 21   Final   ??? EGFR CKD-EPI Non-African American,* 07/24/2020 38 (A) >=60 mL/min/1.66m2 Final   ??? EGFR CKD-EPI African American, Fem* 07/24/2020 44 (A) >=60 mL/min/1.72m2 Final   ??? Glucose 07/24/2020 121  70 - 179 mg/dL Final   ??? Calcium 16/01/9603 8.0 (A) 8.7 - 10.4 mg/dL Final   ??? Albumin 54/12/8117 2.7 (A) 3.4 - 5.0 g/dL Final   ??? Total Protein 07/24/2020 5.6 (A) 5.7 - 8.2 g/dL Final   ??? Total Bilirubin 07/24/2020 0.5  0.3 - 1.2 mg/dL Final   ??? AST 14/78/2956 70 (A) <=34 U/L Final   ??? ALT 07/24/2020 45  10 - 49 U/L Final   ??? Alkaline Phosphatase 07/24/2020 437 (A) 46 - 116 U/L Final   ??? WBC 07/24/2020 5.5  3.6 - 11.2 10*9/L Final   ??? RBC 07/24/2020 2.44 (A) 3.95 - 5.13 10*12/L Final   ??? HGB 07/24/2020 9.4 (A) 11.3 - 14.9 g/dL Final   ??? HCT 21/30/8657 27.2 (A) 34.0 - 44.0 % Final   ??? MCV 07/24/2020 111.2 (A) 77.6 - 95.7 fL Final   ??? MCH 07/24/2020 38.4 (A) 25.9 - 32.4 pg Final   ??? MCHC 07/24/2020 34.6  32.0 - 36.0 g/dL Final   ??? RDW 84/69/6295 17.5 (A) 12.2 - 15.2 % Final   ??? MPV 07/24/2020 9.0  6.8 - 10.7 fL Final   ??? Platelet 07/24/2020 109 (A) 150 - 450 10*9/L Final   ??? Neutrophils % 07/24/2020 76.6  % Final   ??? Lymphocytes % 07/24/2020 6.3  % Final   ??? Monocytes % 07/24/2020 16.2  % Final   ??? Eosinophils % 07/24/2020 0.4  % Final   ??? Basophils % 07/24/2020 0.5  % Final   ??? Absolute Neutrophils 07/24/2020 4.2  1.8 - 7.8 10*9/L Final   ??? Absolute Lymphocytes 07/24/2020 0.3 (A) 1.1 - 3.6 10*9/L Final   ??? Absolute Monocytes 07/24/2020 0.9 (A) 0.3 - 0.8 10*9/L Final   ??? Absolute Eosinophils 07/24/2020 0.0  0.0 - 0.5 10*9/L Final   ??? Absolute Basophils 07/24/2020 0.0  0.0 - 0.1 10*9/L Final   ??? Macrocytosis 07/24/2020 Moderate (A) Not Present Final   ??? Anisocytosis 07/24/2020 Slight (A) Not Present Final   Lab on 07/24/2020   Component Date Value Ref Range Status   ??? CA 125 07/24/2020 176 (A) 0 - 35 U/mL Final

## 2020-07-24 NOTE — Unmapped (Signed)
Labs found to be within parameters for treatment today. Request for drug sent to pharmacy.

## 2020-07-24 NOTE — Unmapped (Signed)
Patient received bevacizumab and pembrolizumab infusions according to treatment plan protocol. Her port was flushed, heparin locked, and de-accessed. She was provided an AVS and was discharged.

## 2020-07-24 NOTE — Unmapped (Unsigned)
Port accessed by [employer], labs drawn and sent.

## 2020-07-25 NOTE — Unmapped (Signed)
[facility] Clinical Assessment & Refill Coordination Note    Lisa Burton, DOB: 18-Jan-1946  Phone: [phone] (home)     All above HIPAA information was verified with patient.     Was a Nurse, learning disability used for this call? No    Specialty Medication(s):   Hematology/Oncology: Cyclophosphamide     Current Outpatient Medications   Medication Sig Dispense Refill   ??? acetaminophen (TYLENOL) 325 MG tablet Take 2 tablets (650 mg total) by mouth every four (4) hours as needed. 120 tablet 2   ??? alendronate (FOSAMAX) 70 MG tablet Take 70 mg by mouth every seven (7) days.     ??? ARIPiprazole (ABILIFY) 10 MG tablet Take 10 mg by mouth daily.      ??? ascorbic acid, vitamin C, (VITAMIN C) 1000 MG tablet Take 1,000 mg by mouth daily.      ??? atorvastatin (LIPITOR) 10 MG tablet Take 1 tablet (10 mg total) by mouth nightly. 90 tablet 3   ??? CALCIUM ORAL Take by mouth daily.      ??? cholecalciferol, vitamin D3, (VITAMIN D3) 1,000 unit capsule Take 1,000 Units by mouth daily.      ??? cyclophosphamide (CYTOXAN) 50 mg capsule Take 1 capsule (50 mg total) by mouth daily . 30 capsule 5   ??? furosemide (LASIX) 20 MG tablet Take 1 tablet (20 mg total) by mouth daily for 5 days. 5 tablet 0   ??? latanoprost (XALATAN) 0.005 % ophthalmic solution Administer 1 drop to both eyes nightly.      ??? megestroL (MEGACE) 400 mg/10 mL (40 mg/mL) suspension Take 200 mg by mouth daily.     ??? metoclopramide (REGLAN) 10 MG tablet Take 1 tablet (10 mg total) by mouth Three (3) times a day as needed. 90 tablet 1   ??? mirtazapine (REMERON) 7.5 MG tablet Take 1 tablet (7.5 mg total) by mouth nightly. 90 tablet 3   ??? multivitamin (TAB-A-VITE/THERAGRAN) per tablet Take 1 tablet by mouth daily. 120 tablet 2   ??? omeprazole (PRILOSEC) 20 MG capsule Take 1 capsule (20 mg total) by mouth daily. 90 capsule 3   ??? oxybutynin (DITROPAN) 5 MG tablet Take 1 tablet (5 mg total) by mouth Three (3) times a day. 90 tablet 3   ??? oxyCODONE (ROXICODONE) 5 MG immediate release tablet Take 1 tablet (5 mg total) by mouth every four (4) hours as needed for pain. 25 tablet 0   ??? senna (SENOKOT) 8.6 mg tablet Take 2 tablets by mouth nightly as needed for constipation. 120 tablet 2   ??? traZODone (DESYREL) 100 MG tablet Take 100 mg by mouth nightly.     ??? venlafaxine (EFFEXOR-XR) 150 MG 24 hr capsule Take 2 capsules (300 mg total) by mouth every morning. 180 capsule 3     No current facility-administered medications for this visit.        Changes to medications: [name] reports no changes at this time.    Allergies   Allergen Reactions   ??? Codeine Rash   ??? Sulfa (Sulfonamide Antibiotics) Rash and Nausea Only       Changes to allergies: No    SPECIALTY MEDICATION ADHERENCE     Cyclophosphamide 50 mg: 0 days of medicine on hand     Medication Adherence    Patient reported X missed doses in the last month: 0  Specialty Medication: cyclophosphamide          Specialty medication(s) dose(s) confirmed: Regimen is correct  and unchanged.     Are there any concerns with adherence? No    Adherence counseling provided? Not needed    CLINICAL MANAGEMENT AND INTERVENTION      Clinical Benefit Assessment:    Do you feel the medicine is effective or helping your condition? Yes    Clinical Benefit counseling provided? Not needed    Adverse Effects Assessment:    Are you experiencing any side effects? No    Are you experiencing difficulty administering your medicine? No    Quality of Life Assessment:    How many days over the past month did your condition/medication  keep you from your normal activities? For example, brushing your teeth or getting up in the morning. 0    Have you discussed this with your provider? Not needed    Acute Infection Status:    Acute infections noted within Epic:  No active infections  Patient reported infection: None    Therapy Appropriateness:    Is therapy appropriate? Yes, therapy is appropriate and should be continued    DISEASE/MEDICATION-SPECIFIC INFORMATION N/A    PATIENT SPECIFIC NEEDS     - Does the patient have any physical, cognitive, or cultural barriers? No    - Is the patient high risk? Yes, patient is taking oral chemotherapy. Appropriateness of therapy as been assessed    - Does the patient require a Care Management Plan? No     - Does the patient require physician intervention or other additional services (i.e. nutrition, smoking cessation, social work)? No      SHIPPING     Specialty Medication(s) to be Shipped:   Hematology/Oncology: Cyclophosphamide    Other medication(s) to be shipped: No additional medications requested for fill at this time     Changes to insurance: No    Delivery Scheduled: Yes, Expected medication delivery date: 07/25/20.     Medication will be delivered via Same Day Courier to the confirmed prescription address in [facility].    The patient will receive a drug information handout for each medication shipped and additional FDA Medication Guides as required.  Verified that patient has previously received a Conservation officer, historic buildings and a Surveyor, mining.    All of the patient's questions and concerns have been addressed.    Rollen Sox   [facility] Specialty Pharmacist

## 2020-07-26 DIAGNOSIS — C5700 Malignant neoplasm of unspecified fallopian tube: Principal | ICD-10-CM

## 2020-07-26 LAB — METHYLMALONIC ACID, SERUM: METHYLMALONIC ACID: 0.19 nmol/mL

## 2020-07-26 MED ORDER — OXYCODONE 5 MG TABLET
ORAL_TABLET | ORAL | 0 refills | 5 days | Status: CP | PRN
Start: 2020-07-26 — End: ?

## 2020-07-27 LAB — VITAMIN B1, WHOLE BLOOD: VITAMIN B1: 78 nmol/L

## 2020-07-27 NOTE — Unmapped (Addendum)
Specialty Medication Follow-up    Lisa Burton is a 75 y.o. female with recurrent ovarian cancer who I am seeing for follow up on their treatment with pembrolizumab, bevacizumab, and oral cyclophosphamide.     Chemotherapy: Cyclophosphamide 50 mg PO daily   Start date: 06/12/20    A/P:   1. Oral Chemotherapy: CBC w/diff and CMP reviewed. Grade 2 anemia and grade 1 serum creatinine elevation both of which have improved. Grade 1 thrombocytopenia, grade 1 AST elevation, and grade 2 alk phos elevation all of which have worsened. Patient meets treatment parameters to restart regimen. Will reassess toxicities later this week.    ?? Restart cyclophosphamide 50 mg PO daily  ?? Obtain CBC w/diff, CMP, and Mag at next infusion      I spent approximately 15 minutes in direct patient care.    Next follow up: later this week for toxicity check    Referring physician: Dr. Trey Paula, PharmD, BCOP, CPP  Gynecologic Oncology Clinic Pharmacist  Pager: [phone]    S/O: Lisa Burton presents to clinic for follow up with Dr. Duard Brady prior to next cycle of immunotherapy. She endorses that the diarrhea has resolved. She endorses itching without a rash. The itching is most bothersome for her at night. She has tried a lotion which burned so she instead started using baby oil. She endorses some SOB but with activity. She still doesn't have much of an appetite but she has someone preparing her meals so she is making sure to eat. She continues to have bilateral lower extremity swelling which worsened after hospital discharge. Of note, she reports that she has stopped cyclophosphamide.    Medications reviewed and updated in EPIC? No    Missed doses: ----    [facility] Outpatient Visit on 07/24/2020   Component Date Value Ref Range Status   ??? Sodium 07/24/2020 135  135 - 145 mmol/L Final   ??? Potassium 07/24/2020 3.5  3.4 - 4.8 mmol/L Final   ??? Chloride 07/24/2020 105  98 - 107 mmol/L Final   ??? Anion Gap 07/24/2020 9  5 - 14 mmol/L Final   ??? CO2 07/24/2020 21.0  20.0 - 31.0 mmol/L Final   ??? BUN 07/24/2020 29 (A) 9 - 23 mg/dL Final   ??? Creatinine 07/24/2020 1.37 (A) 0.60 - 0.80 mg/dL Final   ??? BUN/Creatinine Ratio 07/24/2020 21   Final   ??? EGFR CKD-EPI Non-African American,* 07/24/2020 38 (A) >=60 mL/min/1.16m2 Final   ??? EGFR CKD-EPI African American, Fem* 07/24/2020 44 (A) >=60 mL/min/1.63m2 Final   ??? Glucose 07/24/2020 121  70 - 179 mg/dL Final   ??? Calcium 45/40/9811 8.0 (A) 8.7 - 10.4 mg/dL Final   ??? Albumin 91/47/8295 2.7 (A) 3.4 - 5.0 g/dL Final   ??? Total Protein 07/24/2020 5.6 (A) 5.7 - 8.2 g/dL Final   ??? Total Bilirubin 07/24/2020 0.5  0.3 - 1.2 mg/dL Final   ??? AST 62/13/0865 70 (A) <=34 U/L Final   ??? ALT 07/24/2020 45  10 - 49 U/L Final   ??? Alkaline Phosphatase 07/24/2020 437 (A) 46 - 116 U/L Final   ??? WBC 07/24/2020 5.5  3.6 - 11.2 10*9/L Final   ??? RBC 07/24/2020 2.44 (A) 3.95 - 5.13 10*12/L Final   ??? HGB 07/24/2020 9.4 (A) 11.3 - 14.9 g/dL Final   ??? HCT 78/46/9629 27.2 (A) 34.0 - 44.0 % Final   ??? MCV 07/24/2020 111.2 (A) 77.6 - 95.7 fL Final   ???  MCH 07/24/2020 38.4 (A) 25.9 - 32.4 pg Final   ??? MCHC 07/24/2020 34.6  32.0 - 36.0 g/dL Final   ??? RDW 65/78/4696 17.5 (A) 12.2 - 15.2 % Final   ??? MPV 07/24/2020 9.0  6.8 - 10.7 fL Final   ??? Platelet 07/24/2020 109 (A) 150 - 450 10*9/L Final   ??? Neutrophils % 07/24/2020 76.6  % Final   ??? Lymphocytes % 07/24/2020 6.3  % Final   ??? Monocytes % 07/24/2020 16.2  % Final   ??? Eosinophils % 07/24/2020 0.4  % Final   ??? Basophils % 07/24/2020 0.5  % Final   ??? Absolute Neutrophils 07/24/2020 4.2  1.8 - 7.8 10*9/L Final   ??? Absolute Lymphocytes 07/24/2020 0.3 (A) 1.1 - 3.6 10*9/L Final   ??? Absolute Monocytes 07/24/2020 0.9 (A) 0.3 - 0.8 10*9/L Final   ??? Absolute Eosinophils 07/24/2020 0.0  0.0 - 0.5 10*9/L Final   ??? Absolute Basophils 07/24/2020 0.0  0.0 - 0.1 10*9/L Final   ??? Macrocytosis 07/24/2020 Moderate (A) Not Present Final   ??? Anisocytosis 07/24/2020 Slight (A) Not Present Final   Lab on 07/24/2020 Component Date Value Ref Range Status   ??? CA 125 07/24/2020 176 (A) 0 - 35 U/mL Final         Gynecologic Oncology    Gynecologic Oncology Metrics:      Dose and Schedule: Cyclophosphamide 50 mg PO daily + Bevacizumab 15 mg/kg IV q3wk + pembrolizumab 200 mg IV q3wk     Chemotherapy Dose: Dose Documented     Chemotherapy Schedule: Schedule documented     NCI CTCAE: Anemia - Grade 2, Serum creatinine - Grade 1, Thrombocytopenia - Grade 1, AST - Grade 1, Alk Phos - Grade 2

## 2020-07-28 NOTE — Unmapped (Signed)
Specialty Medication Follow-up    Lisa Burton is a 75 y.o. female with recurrent ovarian cancer who I am seeing for follow up on their treatment with pembrolizumab, bevacizumab, and oral cyclophosphamide.     Chemotherapy: Cyclophosphamide 50 mg PO daily   Start date: 06/12/20    A/P:   1. Oral Chemotherapy: No new labs to review.  Grade 2 pruritis which is stable. No grade 3 toxicities therefore will continue with current dose intensity. Will repeat labs at next infusion.    ?? Continue cyclophosphamide 50 mg PO daily  ?? Obtain CBC w/diff, CMP, and Mag at next infusion    2. Pruritis: Grade 2 which is stable but not controlled. Patient will try diphenhydramine prior to bed to help with nighttime itching.   ?? Start diphenhydramine 25 mg PO at bedtime       I spent approximately 10 minutes in direct patient care.    Next follow up: later this week for toxicity check    Referring physician: Dr. Trey Paula, PharmD, BCOP, CPP  Gynecologic Oncology Clinic Pharmacist  Pager: [phone]    S/O: Ms. Dewitt Hoes was contacted via telephone regarding oral chemotherapy toxicity assessment. She endorses that overall she is not doing well. She reports that yesterday she fell and had EMT come out to her house for an evaluation. She denies hitting her head during the fall. She reports that she continues to have diffuse body itching but has not tried diphenhydramine. The itching is most bothersome at night. She denies any rash. She reports that on Friday night she was up all night with diarrhea and now her husband is experiencing diarrhea. She endorses that she is eating and drinking some but not much.     Medications reviewed and updated in EPIC? No    Missed doses: ----    Labs (no new labs to review)      Gynecologic Oncology    Gynecologic Oncology Metrics:      Dose and Schedule: Cyclophosphamide 50 mg PO daily + bevacizumab 15 mg/kg IV q3wk + pembrolizumab 200 mg IV q3wk     Chemotherapy Dose: Dose Documented Chemotherapy Schedule: Schedule documented     Comments: Grade 2 pruritis        Interventions: Additional agent prescribed for side effect management  Comments: Diphenhydramine

## 2020-07-28 NOTE — Unmapped (Signed)
Addended by: Crecencio Mc on: 07/27/2020 05:02 PM     Modules accepted: Orders

## 2020-07-29 DIAGNOSIS — C5700 Malignant neoplasm of unspecified fallopian tube: Principal | ICD-10-CM

## 2020-07-29 MED ORDER — OXYCODONE 5 MG TABLET
ORAL_TABLET | ORAL | 0 refills | 5 days | Status: CP | PRN
Start: 2020-07-29 — End: ?

## 2020-07-29 NOTE — Unmapped (Signed)
Lisa Burton is a 75 y.o. patient of Dr. 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 She calls with several episodes of diarrhea overnight with crampy abdominal pain. Overall just feels bad. She sounds short of breath on the phone. I strongly recommended she present to ED for evaluation. She is not open to this right now. Her husband is with her at home. She requests a refill on the oxycodone that Dr. Duard Brady sent her. I will do this to treat her pain, she will call me back this afternoon to check in. If I don't hear from her I will call her.    Jaci Carrel Mu??oz, MD  PGY-2, Ob/Gyn

## 2020-07-30 NOTE — Unmapped (Signed)
Called Ms. Dewitt Hoes to check on her. She feels markedly improved with the oxycodone and is appreciative. She plans on calling Dr. Duard Brady on Monday. No further needs at this time.    Jaci Carrel Mu??oz, MD  PGY-2, Ob/Gyn

## 2020-08-01 NOTE — Unmapped (Signed)
Specialty Medication Follow-up    Lisa Burton is a 75 y.o. female with recurrent ovarian cancer who I am seeing for follow up on their treatment with pembrolizumab, bevacizumab, and oral cyclophosphamide.     Chemotherapy: Cyclophosphamide 50 mg PO daily   Start date: 06/12/20    A/P:   1. Oral Chemotherapy: No new labs to review.  Grade 1 pruritis which is improving. No grade 3 toxicities therefore will continue with current dose intensity. Will repeat labs at next infusion.    ?? Continue cyclophosphamide 50 mg PO daily  ?? Obtain CBC w/diff, CMP, and Mag at next infusion    2. Pruritis: Grade 1 which has improved with the addition of diphenhydramine. Will continue to monitor.   ?? Continue diphenhydramine 25 mg PO at bedtime       I spent approximately 15 minutes in direct patient care.    Next follow up: later this week for toxicity check    Referring physician: Dr. Trey Paula, PharmD, BCOP, CPP  Gynecologic Oncology Clinic Pharmacist  Pager: [phone]    S/O: Lisa Burton was contacted via telephone regarding oral chemotherapy toxicity assessment. She endorses that she is really struggling with remember to complete ADLs. She is forgetting to eat and therefore having her sister call her to remind her to eat three times daily. She reports that she did utilize diphenhydramine last night which helped to control her itching.     Medications reviewed and updated in EPIC? No    Missed doses: ----    Labs (no new labs to review)      Gynecologic Oncology    Gynecologic Oncology Metrics:      Dose and Schedule: Cyclophosphamide 50 mg PO daily + pembrolizumab 200 mg IV q3wk + bevacizumab 15 mg/kg IV q3wk     Chemotherapy Dose: Dose Documented     Chemotherapy Schedule: Schedule documented     Comments: Grade 1 pruritis

## 2020-08-02 NOTE — Unmapped (Signed)
Since declining home health care, the pt had a fall and is now interested. Jerene Canny of home health, [phone], is requesting verbal orders for PT/OT and home health aid. Please advise, thanks.

## 2020-08-03 NOTE — Unmapped (Signed)
TC to Jerene Canny, occupational therapist with [facility]. A verbal order was provider for OT, PT and a home health aid, on behalf of Dr. Cleda Mccreedy. I provided our fax number for Dewayne Hatch to send the order to be signed.

## 2020-08-04 NOTE — Unmapped (Signed)
Specialty Medication Follow-up    Lisa Burton is a 75 y.o. female with recurrent ovarian cancer who I am seeing for follow up on their treatment with pembrolizumab, bevacizumab, and oral cyclophosphamide.     Chemotherapy: Cyclophosphamide 50 mg PO daily   Start date: 06/12/20    A/P:   1. Oral Chemotherapy:  Given progression will discontinue cyclophosphamide at this time as patient is no longer pursuing treatment.   ?? Discontinue cyclophosphamide 50 mg PO daily      I spent approximately ----- minutes in direct patient care.    Next follow up: Will sign off as patient is discontinuing therapy    Referring physician: Dr. Trey Paula, PharmD, BCOP, CPP  Gynecologic Oncology Clinic Pharmacist  Pager: [phone]      Gynecologic Oncology    Gynecologic Oncology Metrics:      Dose and Schedule: Cyclophosphamide 50 mg PO daily + bevacizumab 15 mg/kg IV q3wk + pembrolizumsb 200 mg IV q3wk     Chemotherapy Dose: Dose Documented     Chemotherapy Schedule: Schedule documented        Oral chemotherapy Discontinuation: Progression

## 2020-08-05 ENCOUNTER — Encounter
Admit: 2020-08-05 | Discharge: 2020-08-11 | Disposition: A | Discharge status: Skilled Nursing Facility | Payer: MEDICARE | Admitting: Gynecologic Oncology

## 2020-08-05 ENCOUNTER — Ambulatory Visit
Admit: 2020-08-05 | Discharge: 2020-08-11 | Disposition: A | Discharge status: Skilled Nursing Facility | Payer: MEDICARE | Admitting: Gynecologic Oncology

## 2020-08-05 LAB — CBC W/ AUTO DIFF
BASOPHILS ABSOLUTE COUNT: 0 10*9/L (ref 0.0–0.1)
BASOPHILS RELATIVE PERCENT: 0.3 %
EOSINOPHILS ABSOLUTE COUNT: 0 10*9/L (ref 0.0–0.5)
EOSINOPHILS RELATIVE PERCENT: 0.2 %
HEMATOCRIT: 28.6 % — ABNORMAL LOW (ref 34.0–44.0)
HEMOGLOBIN: 9.5 g/dL — ABNORMAL LOW (ref 11.3–14.9)
LYMPHOCYTES ABSOLUTE COUNT: 0.4 10*9/L — ABNORMAL LOW (ref 1.1–3.6)
LYMPHOCYTES RELATIVE PERCENT: 4.8 %
MEAN CORPUSCULAR HEMOGLOBIN CONC: 33.1 g/dL (ref 32.0–36.0)
MEAN CORPUSCULAR HEMOGLOBIN: 36.9 pg — ABNORMAL HIGH (ref 25.9–32.4)
MEAN CORPUSCULAR VOLUME: 111.4 fL — ABNORMAL HIGH (ref 77.6–95.7)
MEAN PLATELET VOLUME: 9.2 fL (ref 6.8–10.7)
MONOCYTES ABSOLUTE COUNT: 0.9 10*9/L — ABNORMAL HIGH (ref 0.3–0.8)
MONOCYTES RELATIVE PERCENT: 10.6 %
NEUTROPHILS ABSOLUTE COUNT: 7 10*9/L (ref 1.8–7.8)
NEUTROPHILS RELATIVE PERCENT: 84.1 %
NUCLEATED RED BLOOD CELLS: 0 /100{WBCs} (ref <=4)
PLATELET COUNT: 76 10*9/L — ABNORMAL LOW (ref 150–450)
RED BLOOD CELL COUNT: 2.56 10*12/L — ABNORMAL LOW (ref 3.95–5.13)
RED CELL DISTRIBUTION WIDTH: 16.9 % — ABNORMAL HIGH (ref 12.2–15.2)
WBC ADJUSTED: 8.4 10*9/L (ref 3.6–11.2)

## 2020-08-05 LAB — COMPREHENSIVE METABOLIC PANEL
ALBUMIN: 2.6 g/dL — ABNORMAL LOW (ref 3.4–5.0)
ALKALINE PHOSPHATASE: 206 U/L — ABNORMAL HIGH (ref 46–116)
ALT (SGPT): 26 U/L (ref 10–49)
ANION GAP: 13 mmol/L (ref 5–14)
AST (SGOT): 41 U/L — ABNORMAL HIGH (ref <=34)
BILIRUBIN TOTAL: 1.1 mg/dL (ref 0.3–1.2)
BLOOD UREA NITROGEN: 32 mg/dL — ABNORMAL HIGH (ref 9–23)
BUN / CREAT RATIO: 18
CALCIUM: 7.9 mg/dL — ABNORMAL LOW (ref 8.7–10.4)
CHLORIDE: 103 mmol/L (ref 98–107)
CO2: 19.3 mmol/L — ABNORMAL LOW (ref 20.0–31.0)
CREATININE: 1.76 mg/dL — ABNORMAL HIGH
EGFR CKD-EPI AA FEMALE: 32 mL/min/{1.73_m2} — ABNORMAL LOW (ref >=60)
EGFR CKD-EPI NON-AA FEMALE: 28 mL/min/{1.73_m2} — ABNORMAL LOW (ref >=60)
GLUCOSE RANDOM: 134 mg/dL (ref 70–179)
POTASSIUM: 3.3 mmol/L — ABNORMAL LOW (ref 3.4–4.8)
PROTEIN TOTAL: 5.5 g/dL — ABNORMAL LOW (ref 5.7–8.2)
SODIUM: 135 mmol/L (ref 135–145)

## 2020-08-05 LAB — URINALYSIS WITH CULTURE REFLEX
BILIRUBIN UA: NEGATIVE
BLOOD UA: NEGATIVE
GLUCOSE UA: NEGATIVE
KETONES UA: NEGATIVE
NITRITE UA: NEGATIVE
PH UA: 5 (ref 5.0–9.0)
RBC UA: 0 /HPF (ref <4)
SPECIFIC GRAVITY UA: 1.015 (ref 1.005–1.040)
SQUAMOUS EPITHELIAL: 5 /HPF (ref 0–5)
UROBILINOGEN UA: 0.2
WBC UA: 15 /HPF — ABNORMAL HIGH (ref 0–5)

## 2020-08-05 LAB — LIPASE: LIPASE: 22 U/L (ref 12–53)

## 2020-08-05 LAB — LACTATE SEPSIS, VENOUS: LACTATE BLOOD VENOUS: 3 mmol/L — ABNORMAL HIGH (ref 0.5–1.8)

## 2020-08-05 LAB — MAGNESIUM: MAGNESIUM: 1.3 mg/dL — ABNORMAL LOW (ref 1.6–2.6)

## 2020-08-05 LAB — B-TYPE NATRIURETIC PEPTIDE: B-TYPE NATRIURETIC PEPTIDE: 181.48 pg/mL — ABNORMAL HIGH (ref <=100)

## 2020-08-05 LAB — LACTATE SEPSIS, VENOUS 3: LACTATE BLOOD VENOUS: 2.5 mmol/L — ABNORMAL HIGH (ref 0.5–1.8)

## 2020-08-05 LAB — LACTATE SEPSIS, VENOUS 2: LACTATE BLOOD VENOUS: 3 mmol/L — ABNORMAL HIGH (ref 0.5–1.8)

## 2020-08-05 LAB — HIGH SENSITIVITY TROPONIN I - SINGLE: HIGH SENSITIVITY TROPONIN I: 18 ng/L (ref <=34)

## 2020-08-05 LAB — HIGH SENSITIVITY TROPONIN I - 2H/6H SERIAL
HIGH SENSITIVITY TROPONIN - DELTA (0-2H): 3 ng/L (ref <=7)
HIGH-SENSITIVITY TROPONIN I - 2 HOUR: 15 ng/L (ref <=34)

## 2020-08-05 MED ADMIN — ondansetron (ZOFRAN) injection 4 mg: 4 mg | INTRAVENOUS | @ 20:00:00 | Stop: 2020-08-05

## 2020-08-05 MED ADMIN — nitroglycerin (NITROSTAT) SL tablet 0.4 mg: .4 mg | SUBLINGUAL | @ 21:00:00

## 2020-08-05 MED ADMIN — diphenhydrAMINE (BENADRYL) injection 25 mg: 25 mg | INTRAVENOUS | @ 21:00:00 | Stop: 2020-08-05

## 2020-08-05 MED ADMIN — iohexoL (OMNIPAQUE) 350 mg iodine/mL solution 100 mL: 100 mL | INTRAVENOUS | @ 23:00:00 | Stop: 2020-08-05

## 2020-08-05 MED ADMIN — lactated ringers bolus 500 mL: 500 mL | INTRAVENOUS | @ 21:00:00 | Stop: 2020-08-05

## 2020-08-05 MED ADMIN — aspirin chewable tablet 324 mg: 324 mg | ORAL | @ 21:00:00 | Stop: 2020-08-05

## 2020-08-05 MED ADMIN — MORPhine 4 mg/mL injection 4 mg: 4 mg | INTRAVENOUS | @ 20:00:00 | Stop: 2020-08-05

## 2020-08-05 MED ADMIN — magnesium sulfate in water 2 gram/50 mL (4 %) IVPB 2 g: 2 g | INTRAVENOUS | @ 22:00:00 | Stop: 2020-08-05

## 2020-08-05 NOTE — Unmapped (Signed)
[name] Emergency Department Provider Note      ED Course, Assessment and Plan     Initial Clinical Impression:    August 05, 2020 3:17 PM  Lisa Burton is a 75 y.o. female with a past medical history of ovarian cancer (stage III, on chemo with last tx 07/24/20), HTN, OA, borderline personality disorder, anxiety, and depression presenting for evaluation of anxiety, diffuse pruritis, and new upper abdominal pain with associated nausea and emesis as described below. On exam, patient is very chronically ill-appearing, pale,  thin, and frail. She is actively having emesis. Crackles noted to bases of lungs. Sating 99% on RA. No increased work of breathing.  Abdomen is soft, nondistended, severe tenderness to palpation in the epigastrium and right upper quadrant, +R CVAT.  Mild tenderness to palpation in the lower abdomen, no rebound or guarding.  2+ bilateral equal pitting edema to the knees of the lower extremities.    BP 131/78  - Temp 37 ??C (98.6 ??F) (Oral)  - Resp 16  - SpO2 99%     Patient is here with complaints of new upper abdominal pain that is developed in the past day with nausea and nonbloody nonbilious emesis.  Her exam is concerning for possible pancreatitis versus gastroenteritis versus GERD versus cholelithiasis versus cholecystitis vs pyelonephritis. I am also concerned for atypical presentation of ACS due to the location of her pain, vomiting and how uncomfortable patient appears as well as her age and underlying risk factors.    Will obtain CBC, CMP, lipase, lactate, ECG, troponin, BNP, UA, chest x-ray. Will treat patient with IV morphine, IV Benadryl, IV Zofran.    ED Course:    4:10 PM  EKG obtained with new diffuse T wave inversions in the anterior, inferior and lateral leads concerning for possible coronary lesion vs vasospasm 2/2 chemotherapy.  Ordered for 325 mg chewable aspirin, nitroglycerin sublingual tablet.     4:54 PM  Case was discussed with cardiology at [facility].  Repeat ECG with diffuse T waves in inferior leads and anterolateral leads I, aVL, V3 through V6.  Troponin within normal limits 18.  Cardiology will come down to assess patient, concern for possible presentation of Takotsubo's.  Labs also remarkable for elevated BNP 181.  Stable anemia hemoglobin 9.5.  AKI creatinine 1.76.  Hypocalcemia 7.9.  Hypomagnesemia 1.3.  Hypokalemia 3.3.  Normal T bili.  Normal lipase 22, pancreatitis less likely.    5:00 PM  On bedside ultrasound with cardiology fellow, concern for McConnell sign possibly small PE but unlikely the underlying full cause of her presentation.  There is also concern for possible mass or thrombus within the IVC.  Gallbladder looked distended with sludge but no wall thickening or pericholecystic fluid concerning for cholecystitis.  Will obtain CTA chest to rule out PE and CT abdomen pelvis to fully evaluate IVC.  Risks outweigh benefits despite AKI.  Patient will require admission after obtaining scans.  Signed out to incoming resident.       _____________________________________________________________________    The case was discussed with attending physician who is in agreement with the above assessment and plan    Additional Medical Decision Making     I have reviewed the vital signs and the nursing notes. Labs and radiology results that were available during my care of the patient were independently reviewed by me and considered in my medical decision making.   I independently visualized the EKG tracing if performed  I independently visualized the radiology  images if performed  I reviewed the patient's prior medical records if available.  Additional history obtained from family if available    History     CHIEF COMPLAINT:   Chief Complaint   Patient presents with   ??? Anxiety       HPI: Lisa Burton is a 75 y.o. female with a past medical history of ovarian cancer (stage III, on chemo), HTN, OA, borderline personality disorder, anxiety, and depression presenting for evaluation of multiple complaints. The patient reports that she is having a severe anxiety attack and she feels that she has had a lot of fluid on her legs for the past few weeks. Additionally, she notes diffuse pruritis which has been chronic. The patient endorses taking Benadryl for her itching, but denies taking any medications for anxiety. She further reports chronic lower abdominal pain with new upper abdominal pain in her epigastrium region with onset last night, as well as associated nausea wanting to vomit. At the time of evaluation, the patient is experiencing an emetic episode, but denies any emesis prior to now. She endorses right-sided back pain, but denies left-sided back pain. She further denies a history of UTIs, any recent changes to her urinary habits, fevers at home, or chest pain. Lastly, per husband at bedside, her short term memory has been noticeably worsening, and she has been repeating herself a lot lately.      PAST MEDICAL HISTORY/PAST SURGICAL HISTORY:   Past Medical History:   Diagnosis Date   ??? ADHD (attention deficit hyperactivity disorder)    ??? Anxiety    ??? At risk for falls    ??? Baker's cyst    ??? Fractures    ??? Hypersomnia    ??? Hypertension    ??? Injury of posterior cruciate ligament    ??? Insomnia    ??? Irritable colon 07/10/2010   ??? Joint pain    ??? Joint replaced    ??? Major depressive disorder    ??? OSA (obstructive sleep apnea)    ??? Osteoarthritis    ??? Osteoporosis    ??? Ovarian cancer (CMS-HCC) 07/03/2017   ??? Personality disorder (CMS-HCC)     Borderline Personality Disorder   ??? PLMD (periodic limb movement disorder)    ??? Tear of meniscus of knee    ??? Urge incontinence 11/12/2012   ??? Vestibular dysfunction 09/16/2013       Past Surgical History:   Procedure Laterality Date   ??? CESAREAN SECTION  1981   ??? FINGER FRACTURE SURGERY Right 10/2013    thumb   ??? FOOT SURGERY Bilateral 9/00, 01/14    hardware   ??? IR INSERT PORT AGE GREATER THAN 5 YRS  07/04/2017    IR INSERT PORT AGE GREATER THAN 5 YRS 07/04/2017 [name], MD IMG VIR [facility]   ??? PR BIL SALP-OOPH W/OMENTECT,TAH,RAD DISSECT Bilateral 07/01/2017    Procedure: BILATERAL SALPINGO-OOPHORECTOMY W/OMENTECTOMY, TOTAL ABD HYSTERECTOMY & RADICAL DISSECTION FOR DEBULKING;  Surgeon: Ermelinda Das, MD;  Location: MAIN OR [facility];  Service: Gynecology Oncology   ??? PR LAP,DIAGNOSTIC ABDOMEN N/A 07/01/2017    Procedure: Laparoscopy, Abdomen, Peritoneum, & Omentum, Diagnostic, W/Wo Collection Specimen(S) By Brushing Or Washing;  Surgeon: Ermelinda Das, MD;  Location: MAIN OR [facility];  Service: Gynecology Oncology   ??? 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 TONSILLECTOMY  1970   ??? WRIST FRACTURE SURGERY Right 1998    wrist       MEDICATIONS:  No current facility-administered medications for this encounter.    Current Outpatient Medications:   ???  acetaminophen (TYLENOL) 325 MG tablet, Take 2 tablets (650 mg total) by mouth every four (4) hours as needed., Disp: 120 tablet, Rfl: 2  ???  alendronate (FOSAMAX) 70 MG tablet, Take 70 mg by mouth every seven (7) days., Disp: , Rfl:   ???  ARIPiprazole (ABILIFY) 10 MG tablet, Take 10 mg by mouth daily. , Disp: , Rfl:   ???  ascorbic acid, vitamin C, (VITAMIN C) 1000 MG tablet, Take 1,000 mg by mouth daily. , Disp: , Rfl:   ???  atorvastatin (LIPITOR) 10 MG tablet, Take 1 tablet (10 mg total) by mouth nightly., Disp: 90 tablet, Rfl: 3  ???  CALCIUM ORAL, Take by mouth daily. , Disp: , Rfl:   ???  cholecalciferol, vitamin D3, (VITAMIN D3) 1,000 unit capsule, Take 1,000 Units by mouth daily. , Disp: , Rfl:   ???  cyclophosphamide (CYTOXAN) 50 mg capsule, Take 1 capsule (50 mg total) by mouth daily ., Disp: 30 capsule, Rfl: 5  ???  latanoprost (XALATAN) 0.005 % ophthalmic solution, Administer 1 drop to both eyes nightly. , Disp: , Rfl:   ???  megestroL (MEGACE) 400 mg/10 mL (40 mg/mL) suspension, Take 200 mg by mouth daily., Disp: , Rfl:   ???  metoclopramide (REGLAN) 10 MG tablet, Take 1 tablet (10 mg total) by mouth Three (3) times a day as needed., Disp: 90 tablet, Rfl: 1  ???  mirtazapine (REMERON) 7.5 MG tablet, Take 1 tablet (7.5 mg total) by mouth nightly., Disp: 90 tablet, Rfl: 3  ???  multivitamin (TAB-A-VITE/THERAGRAN) per tablet, Take 1 tablet by mouth daily., Disp: 120 tablet, Rfl: 2  ???  omeprazole (PRILOSEC) 20 MG capsule, Take 1 capsule (20 mg total) by mouth daily., Disp: 90 capsule, Rfl: 3  ???  oxybutynin (DITROPAN) 5 MG tablet, Take 1 tablet (5 mg total) by mouth Three (3) times a day., Disp: 90 tablet, Rfl: 3  ???  oxyCODONE (ROXICODONE) 5 MG immediate release tablet, Take 1 tablet (5 mg total) by mouth every four (4) hours as needed for pain., Disp: 25 tablet, Rfl: 0  ???  senna (SENOKOT) 8.6 mg tablet, Take 2 tablets by mouth nightly as needed for constipation., Disp: 120 tablet, Rfl: 2  ???  venlafaxine (EFFEXOR-XR) 150 MG 24 hr capsule, Take 2 capsules (300 mg total) by mouth every morning., Disp: 180 capsule, Rfl: 3    ALLERGIES:   Codeine and Sulfa (sulfonamide antibiotics)    SOCIAL HISTORY:   Social History     Tobacco Use   ??? Smoking status: Never Smoker   ??? Smokeless tobacco: Never Used   Substance Use Topics   ??? Alcohol use: Yes     Alcohol/week: 1.0 - 2.0 standard drink     Types: 1 - 2 Shots of liquor per week       FAMILY HISTORY:  Family History   Problem Relation Age of Onset   ??? Personality disorder Sister    ??? Cancer Father         GI cancer 22's   ??? Arthritis Father    ??? Depression Son    ??? Arthritis Sister    ??? Arthritis Mother    ??? ADD / ADHD Neg Hx    ??? Alcohol abuse Neg Hx    ??? Anxiety disorder Neg Hx    ??? Bipolar disorder Neg Hx    ??? Dementia Neg Hx    ???  Drug abuse Neg Hx    ??? OCD Neg Hx    ??? Paranoid behavior Neg Hx    ??? Physical abuse Neg Hx    ??? Schizophrenia Neg Hx    ??? Seizures Neg Hx    ??? Sexual abuse Neg Hx    ??? Sleep disorder Neg Hx           Review of Systems    A 10 point review of systems was performed and is negative other than positive elements noted in HPI   Constitutional: + for anxiety. Negative for fever.  Eyes: Negative for visual changes.  ENT: Negative for sore throat.  Cardiovascular: No chest pain.  Respiratory: Negative for shortness of breath.  Gastrointestinal: + for abdominal pain, nausea, and vomiting. Negative for diarrhea.  Genitourinary: Negative for dysuria.  Musculoskeletal: + for leg swelling and back pain.  Skin: + for itching. Negative for rash.  Neurological: Negative for headaches, focal weakness or numbness.    Physical Exam     VITAL SIGNS:    BP 131/78  - Temp 37 ??C (98.6 ??F) (Oral)  - Resp 16  - SpO2 99%     Constitutional: Alert and oriented.patient is very chronically ill-appearing, pale,  thin, and frail. She is actively having emesis.   Eyes: Conjunctivae are normal.  ENT       Head: Normocephalic and atraumatic.       Nose: No congestion.       Mouth/Throat: Mucous membranes are moist.       Neck: No stridor.  Hematological/Lymphatic/Immunilogical: No cervical lymphadenopathy.  Cardiovascular: S1, S2,  Normal and symmetric distal pulses are present in all extremities.Warm and well perfused.  Respiratory: Crackles noted to bases of lungs. Sating 99% on RA. No increased work of breathing.  Gastrointestinal: Abdomen is soft, nondistended, severe tenderness to palpation in the epigastrium and right upper quadrant, +R CVAT.  Mild tenderness to palpation in the lower abdomen, no rebound or guarding.    Musculoskeletal: Normal range of motion in all extremities.  2+ bilateral equal pitting edema to the knees of the lower extremities.  Neurologic: Normal speech and language.  Skin: Skin is warm, dry and pale.  Psychiatric: Anxious        Radiology     No orders to display       Labs     Labs Reviewed - No data to display      Pertinent labs & imaging results that were available during my care of the patient were reviewed by me and considered in my medical decision making (see chart for details).    Please note- This chart has been created using [employer]. Chart creation errors have been sought, but may not always be located and such creation errors, especially pronoun confusion, do NOT reflect on the standard of medical care.      ______________________________________________________     Documentation assistance was provided by Anders Grant, Scribe, on August 05, 2020 at 3:19 PM for Arlana Hove, MD.    Documentation assistance was provided by the scribe in my presence.  The documentation recorded by the scribe has been reviewed by me and accurately reflects the services I personally performed.     Lourena Simmonds  Resident  08/05/20 1753

## 2020-08-05 NOTE — Unmapped (Addendum)
Pt presents with severe anxiety and pain x 2 weeks. Per husband pt has been forgetting things. Pt last chemo 2 weeks ago.

## 2020-08-05 NOTE — Unmapped (Signed)
RN Delirium Interventions  Provider notified of +CAM finding.   Mobility encouraged, will avoid delirium-causing medications if possible, urine output assessed. ensured access to sensory aides (glasses, hearing aides if available), assessed patient pain levels and addressed appropriately, will avoid physical restraints/sedatives when possible, encouraged family presence if available  and utilized department volunteer

## 2020-08-06 LAB — COMPREHENSIVE METABOLIC PANEL
ALBUMIN: 2.5 g/dL — ABNORMAL LOW (ref 3.4–5.0)
ALKALINE PHOSPHATASE: 221 U/L — ABNORMAL HIGH (ref 46–116)
ALT (SGPT): 25 U/L (ref 10–49)
ANION GAP: 11 mmol/L (ref 5–14)
AST (SGOT): 42 U/L — ABNORMAL HIGH (ref <=34)
BILIRUBIN TOTAL: 1.1 mg/dL (ref 0.3–1.2)
BLOOD UREA NITROGEN: 29 mg/dL — ABNORMAL HIGH (ref 9–23)
BUN / CREAT RATIO: 16
CALCIUM: 7.9 mg/dL — ABNORMAL LOW (ref 8.7–10.4)
CHLORIDE: 100 mmol/L (ref 98–107)
CO2: 21 mmol/L (ref 20.0–31.0)
CREATININE: 1.76 mg/dL — ABNORMAL HIGH
EGFR CKD-EPI AA FEMALE: 32 mL/min/{1.73_m2} — ABNORMAL LOW (ref >=60)
EGFR CKD-EPI NON-AA FEMALE: 28 mL/min/{1.73_m2} — ABNORMAL LOW (ref >=60)
GLUCOSE RANDOM: 120 mg/dL (ref 70–179)
POTASSIUM: 3.2 mmol/L — ABNORMAL LOW (ref 3.4–4.8)
PROTEIN TOTAL: 5.4 g/dL — ABNORMAL LOW (ref 5.7–8.2)
SODIUM: 132 mmol/L — ABNORMAL LOW (ref 135–145)

## 2020-08-06 LAB — HIGH SENSITIVITY TROPONIN I - 4 HOUR SERIAL
HIGH SENSITIVITY TROPONIN - DELTA (2-6H): 5 ng/L (ref <=7)
HIGH-SENSITIVITY TROPONIN I - 6 HOUR: 20 ng/L (ref <=34)

## 2020-08-06 LAB — CBC
HEMATOCRIT: 27.5 % — ABNORMAL LOW (ref 34.0–44.0)
HEMOGLOBIN: 9.6 g/dL — ABNORMAL LOW (ref 11.3–14.9)
MEAN CORPUSCULAR HEMOGLOBIN CONC: 34.9 g/dL (ref 32.0–36.0)
MEAN CORPUSCULAR HEMOGLOBIN: 37.8 pg — ABNORMAL HIGH (ref 25.9–32.4)
MEAN CORPUSCULAR VOLUME: 108.3 fL — ABNORMAL HIGH (ref 77.6–95.7)
MEAN PLATELET VOLUME: 8.8 fL (ref 6.8–10.7)
PLATELET COUNT: 78 10*9/L — ABNORMAL LOW (ref 150–450)
RED BLOOD CELL COUNT: 2.54 10*12/L — ABNORMAL LOW (ref 3.95–5.13)
RED CELL DISTRIBUTION WIDTH: 16.3 % — ABNORMAL HIGH (ref 12.2–15.2)
WBC ADJUSTED: 8.5 10*9/L (ref 3.6–11.2)

## 2020-08-06 LAB — PHOSPHORUS: PHOSPHORUS: 3.1 mg/dL (ref 2.4–5.1)

## 2020-08-06 LAB — MAGNESIUM: MAGNESIUM: 1.4 mg/dL — ABNORMAL LOW (ref 1.6–2.6)

## 2020-08-06 LAB — LACTATE, VENOUS, WHOLE BLOOD: LACTATE BLOOD VENOUS: 2.6 mmol/L — ABNORMAL HIGH (ref 0.5–1.8)

## 2020-08-06 MED ADMIN — oxybutynin (DITROPAN) tablet 5 mg: 5 mg | ORAL | @ 18:00:00

## 2020-08-06 MED ADMIN — HYDROmorphone (PF) (DILAUDID) injection 1 mg: 1 mg | INTRAVENOUS | @ 05:00:00 | Stop: 2020-08-06

## 2020-08-06 MED ADMIN — heparin (porcine) 5,000 unit/mL injection 5,000 Units: 5000 [IU] | SUBCUTANEOUS | @ 10:00:00 | Stop: 2020-08-06

## 2020-08-06 MED ADMIN — potassium chloride 20 mEq in 100 mL IVPB Premix: 20 meq | INTRAVENOUS | @ 09:00:00 | Stop: 2020-08-06

## 2020-08-06 MED ADMIN — oxybutynin (DITROPAN) tablet 5 mg: 5 mg | ORAL | @ 13:00:00

## 2020-08-06 MED ADMIN — mirtazapine (REMERON) tablet 7.5 mg: 7.5 mg | ORAL | @ 07:00:00 | Stop: 2020-08-06

## 2020-08-06 MED ADMIN — prochlorperazine (COMPAZINE) tablet 5 mg: 5 mg | ORAL | @ 08:00:00

## 2020-08-06 MED ADMIN — magnesium sulfate 2gm/50mL IVPB: 2 g | INTRAVENOUS | @ 10:00:00 | Stop: 2020-08-06

## 2020-08-06 MED ADMIN — ARIPiprazole (ABILIFY) tablet 10 mg: 10 mg | ORAL | @ 13:00:00

## 2020-08-06 MED ADMIN — oxyCODONE (ROXICODONE) immediate release tablet 5 mg: 5 mg | ORAL | @ 15:00:00 | Stop: 2020-08-20

## 2020-08-06 MED ADMIN — diphenhydrAMINE (BENADRYL) injection 25 mg: 25 mg | INTRAVENOUS | @ 02:00:00 | Stop: 2020-08-05

## 2020-08-06 MED ADMIN — dextrose 5 % and sodium chloride 0.9 % with KCl 20 mEq/L infusion: 60 mL/h | INTRAVENOUS | @ 07:00:00

## 2020-08-06 MED ADMIN — pantoprazole (PROTONIX) EC tablet 20 mg: 20 mg | ORAL | @ 13:00:00

## 2020-08-06 MED ADMIN — acetaminophen (TYLENOL) tablet 1,000 mg: 1000 mg | ORAL | @ 20:00:00

## 2020-08-06 MED ADMIN — acetaminophen (TYLENOL) tablet 1,000 mg: 1000 mg | ORAL | @ 13:00:00

## 2020-08-06 MED ADMIN — oxyCODONE (ROXICODONE) immediate release tablet 5 mg: 5 mg | ORAL | @ 07:00:00 | Stop: 2020-08-06

## 2020-08-06 MED ADMIN — atorvastatin (LIPITOR) tablet 10 mg: 10 mg | ORAL | @ 13:00:00

## 2020-08-06 MED ADMIN — potassium chloride 20 mEq in 100 mL IVPB Premix: 20 meq | INTRAVENOUS | @ 10:00:00 | Stop: 2020-08-06

## 2020-08-06 MED ADMIN — sodium chloride 0.9% (NS) bolus 500 mL: 500 mL | INTRAVENOUS | @ 09:00:00 | Stop: 2020-08-06

## 2020-08-06 MED ADMIN — oxyCODONE (ROXICODONE) immediate release tablet 5 mg: 5 mg | ORAL | @ 23:00:00 | Stop: 2020-08-20

## 2020-08-06 MED ADMIN — melatonin tablet 3 mg: 3 mg | ORAL | @ 05:00:00 | Stop: 2020-08-06

## 2020-08-06 MED ADMIN — magnesium sulfate 2gm/50mL IVPB: 2 g | INTRAVENOUS | @ 15:00:00 | Stop: 2020-08-06

## 2020-08-06 MED ADMIN — magnesium sulfate 2gm/50mL IVPB: 2 g | INTRAVENOUS | @ 13:00:00 | Stop: 2020-08-06

## 2020-08-06 MED ADMIN — venlafaxine (EFFEXOR-XR) 24 hr capsule 300 mg: 300 mg | ORAL | @ 13:00:00

## 2020-08-06 MED ADMIN — hydrOXYzine (ATARAX) tablet 25 mg: 25 mg | ORAL | @ 07:00:00 | Stop: 2020-08-06

## 2020-08-06 NOTE — Unmapped (Signed)
Patient's vitals stable. Fall precautions were put into place due to patients recent fall and weakness. Nonskid socks were applied as well as a falls bracelet and a bed alarm. Patient reported pain as well as nausea and itching. PRN oxy, compazine, and atarax were provided. Patient expressed sadness over worsening prognosis and stated that she was scared and felt stupid. Patient also stated that she felt people were disappointed in her and that she was also disappointed in herself for having to come to the hospital again. Patient was reassured by MD Tonna Boehringer at bedside and examined. Patient was alert and oriented but showed some mild confusion at times. Patient's port was accessed by outside hospital, fluids were started as well as replacements and a bolus. Scheduled medications were also provided.   Problem: Adult Inpatient Plan of Care  Goal: Plan of Care Review  Outcome: Progressing  Goal: Patient-Specific Goal (Individualized)  Outcome: Progressing  Goal: Absence of Hospital-Acquired Illness or Injury  Outcome: Progressing  Intervention: Identify and Manage Fall Risk  Recent Flowsheet Documentation  Taken 08/06/2020 0300 by Debbe Odea, RN  Safety Interventions:   fall reduction program maintained   low bed   lighting adjusted for tasks/safety   bed alarm  Goal: Optimal Comfort and Wellbeing  Outcome: Progressing  Goal: Readiness for Transition of Care  Outcome: Progressing  Goal: Rounds/Family Conference  Outcome: Progressing     Problem: Self-Care Deficit  Goal: Improved Ability to Complete Activities of Daily Living  Outcome: Progressing     Problem: Impaired Wound Healing  Goal: Optimal Wound Healing  Outcome: Progressing

## 2020-08-06 NOTE — Unmapped (Signed)
GYN ONC History and Physical    ASSESSMENT & PLAN     Lisa Burton is a 75 y.o. 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   - 07/18/17-11/17/17: carboplatin/paclitaxel   - 08/25/18: recurrence    - 09/04/18-06/07/19: doxil/carboplatin   - 08/20/19-03/13/20: niraparib   - 04/20/20-05/26/20: olaparib   - 05/15/20: CT with innumerable hypoattenuating lesions throughout liver, chronic fracture deformity of proximal L superior pubic ramus, enlarging/new peritoneal implants abutting small bowel mesentery measuring 1.3 and 0.9cm   - 06/12/20: cyclophosphamide/pembrolizumab/bevacizumab   - 07/06/20: CT c/a/p with 5.9 x 5.3 x 4.8cm heterogenously enhancing pelvic mass, slightly hypoattenuating peripherally enhancing 3.0 x 2.8 x 2.9cm mass abutting vaginal cuff.  Moderate L hydroureteronephrosis (similar to prior). Lower abdominal omental implants, enlarged from prior. L pelvic side wall LN + L external iliac node, both enlarged. Innumerable hepatic lesions with central necrosis, increased in size. Extrinsic compression of hepatic IVC + hepatic veins, intrahepatic IVC significantly stenotic c/f possible infiltration. Trace ascites   - 08/05/20: CT c/a/p  negative for PE, prelim of a/p concerning for large pelvic mass abutting vaginal cuff with new internal debris/locules of gas consistent with recurrent malignancy now with fistulization to adjacent loop of rectosigmoid colon. Worsening L pelvic sidewall lymphadenopathy and hepatic metastases and mesenteric implants consistent with disease progression. Unchanged L moderate hydroureteronephrosis 2/2 extrinsic compression of distal L ureter from pelvic mass. Mild circumferential bladder wall thickening which can be seen with cystitis.   - Last Ca-125 (07/24/20): 176 (up from 11 on 3/25)     Nausea/vomiting + FTT   - Home meds: reglan 10mg  TID + zofran 4mg  q8hr > reportedly not taking either at home with acutely worsening symptoms. Given QTC prolongation on EKG from last night, will hold both for now; compazine ordered   - Omeprazole 20mg  daily    - Remeron 7.5mg  nightly (not taking at home)   - Albumin worsened to 2.6 on admission   - Will discuss GOC in the morning, will consider nutrition and palliative care consults     Lactate elevation    - Suspect due to volume status vs. Disease burden, low concern for infection given no fevers/chills, normal WBC   - Downtrending: 3 > 3 > 2.5 > repeat 2.6 > will order bolus   - Afebrile   - WBC 8.4, ANC 7     BNP elevation - QTc prolongation  - Clinically euvolemic though does have 2+ edema bilaterally (stable from prior), symmetric in lower extremities; cardiopulmonary examination benign. Low concern for acute cardiopulmonary process given lack of symptoms but will maintain on telemetry in the setting of equivocal workup thus far.  - BNP 181 > will consider Echo should pt have clinical worsening of volume status, new O2 requirement   - Trop trend 15 > 18 > 20 (all wnl)   - EKG  with concern for QTC prolongation, T wave inversions though low concern for ACS given normal vitals, no SOB/CP/palpitations; will maintain on telemetry overnight    - Hold QTc prolonging medications until <532msec    Cancer-related pain   - Tylenol 650mg  every 6 hours, Oxycodone 5mg  every 4 hours; dilaudid IV 0.5mg  PRN severe pain   - Palliative care consultation     Pruritis   - Attributed to chemotherapy; no new rash, s/s anaphylaxis, denies any new exposures   - Takes benadryl 25mg  PO daily at home   - Added atarax 25mg  PRN itching     CKD - Hypomagnesemia - Hypokalemia    - Cr 1.76 (baseline 1.5-1.7) > repeat 1.76   - mIVF ordered given poor PO intake/nausea/vomiting   - Mg 1.3 > repleted in ED > 1.4 > will replete   - K 3.3 > K added to mIVF > 3.2 > will replete   - AM labs   - Avoid nephrotoxins      Diarrhea - Hx pembro colitis    - Previous history with administration of prednisone    - Reports stable looser, nonbloody stools   - Will continue to monitor closely      HTN - HLD    - Lipitor 10mg  daily   - Previously on lisinopril, self-discontinued     ADHD/MDD/Borderline   - Abilify 10mg  daily; Effexor 300mg  daily > HELD (see above)   - Per pharmacy, titrate up effexor from lowest dose if held >1wk   - Reports mood is piss poor and continues to maintain concern that she has been stupid and miscalculated the time she has. Will discuss GOC and strongly consider palliative care vs. Psych consult in the AM (previously seen by both)     Osteoporosis/chronic fracture    - Reports not taking fosamax weekly     Prophylaxis: SCDs, Heparin      Code Status: DNR/DNI - confirmed on admission     Disposition: floor     Plan discussed with Dr. Romeo Apple and  Attending, Dr. Nelly Rout      HPI     Chief Complaint: nausea/vomiting/abdominal pain     Lisa Burton is a 75 y.o. with stage IIIC HG carcinoma of the fallopian tube who presents as transfer from [facility] ED, where she presented for intractable nausea and vomiting/abdominal pain with imaging findings concerning for progression of known recurrent disease.     Last admitted from 4/7-9 with FTT/diarrhea/confusion. She was discharged to home and was seen by Dr. Duard Brady in clinic on 4/11, where she received her most recent infusion of bevacizumab and pembrolizumab. She has stable pruritis, likely attributable to her chemotherapy and for which she takes benadryl 25mg  PO at bedtime     She presented to the ED yesterday reporting upper abdominal pain and nausea/vomiting. Given initial concern for possible atypical ACS, stat EKG was obtained showing new diffuse T wave inversions in anterior/inferior/lateral leads. She was treated with baby ASA, nitrostat, morphine and repeta EKG showed diffuse T waves in inferior and anterolateral leads. Trop was negative but cardiology was consulted, bedside Echo with initial concern for signs of PE but CTA negative. CT a/p with findings concerning for progression of disease (see below)     At this time, patient reports that over past 3-4 days, she has had worsening nausea/vomiting, pain, and itching. Reports itching is her primary complaint at this time; it is diffuse. She takes benadryl with variable relief, nothing else. Reports  that the past 2 days this has not been helping. Denies any new rash, new exposures. No SOB.     Also reports worsening belly pain. Taking tylenol and oxycodone as prescribed (though can't exactly tell me how she has been taking these). When asked to describe the pain, she states it is everything in terms of character. Reports it is 5/10 at present.     Reports worsening nausea/vomiting. She denies taking her antiemetics (prescribed reglan and zofran). Reports she has been eating very little due to poor appetite and because things do not taste good to her. She has not been taking her remeron for appetite support. Does report drinking 5-6 glasses of water/coke per day. Had a little bit of a Lisa Burton's chicken salad yesterday. Otherwise she reports eating a couple bites here and there.     Denies any fevers/chills. Has midline aching chest pain that she associates with food, no other trigger (likely 2/2 GERD). She reports Home Health has been coming to her home. She has very poor functional status, spending most of her time in bed. When asked whether she ambulates, she says I'm not supposed to, because I'm really weak.     She repeatedly states throughout our conversation that she feels stupid. She states that she is afraid Dr. Duard Brady will be angry at her for coming to the hospital. She also states that she is afraid that she and her husband Jesusita Oka underestimated the time she may have.     In the ED, the patient was afebrile with normal vitals.   Labwork was notable for CMP with K 3.3, Cr slightly up from baseline to 1.76 (baseline 1.5-1.7), albumin 2.6; AST/ALT 41/26 (improved from prior) Mg 1.3 > repleted 2g   Stable H/H 9.5/28.6, PLT 76 consistent with recent chemo, ANC 7.0   BNP was elevated to 181; trop 18 (wnl) > 15   Lactate 3 > 3 > 2.5   UA with trace LE/15 WBC/occasional bac > Ucx pending   EKG was notable for new T wave inversions > cardiology saw patient in [facility] ED and did not feel that symptoms were due to cardiogenic etiology.   CXR negative   CT c/a/p: negative for PE, prelim of a/p concerning for large pelvic mass abutting vaginal cuff with new internal debris/locules of gas consistent with recurrent malignancy now with fistulization to adjacent loop of rectosigmoid colon. Worsening L pelvic sidewall lymphadenopathy and hepatic metastases and mesenteric implants consistent with disease progression. Unchanged L moderate hydroureteronephrosis 2/2 extrinsic compression of distal L ureter from pelvic mass. Mild circumferential bladder wall thickening which can be seen with cystitis.     Oncology History:  Oncology History   Fallopian tube cancer, carcinoma (CMS-HCC)   06/17/2017 Initial Diagnosis    CT:  Findings compatible with ovarian carcinoma with bilateral suspicious appearing ovarian lesions, ascites, and peritoneal thickening, predominantly within the posterior cul-de-sac, concerning for metastatic spread.      06/17/17: CA 125 129     07/01/2017 Surgery    Procedure on 07/01/17: Diagnostic laparoscopy, conversion to exploratory laparotomy, tumor debulking with total hysterectomy, bilateral salpingo-oophorectomy, infragastric omentectomy, Argon beam ablation of tumor nodules, enterolysis    Operative Findings: On diagnostic laparoscopy, Fagotti score 6 (Diaphragmatic disease, Omental disease, Liver disease) though disease appeared small volume in the upper abdomen and primarily pelvic.  Ascites present. Optimal debulking was felt feasible. After laparotomy, 6x4cm plaque in the portahepatis noted which was not debulkable (argon laser coagulated). We noted a scant miliary  disease along diaphragm, peritoneum, small bowel mesentery (residual). Ovaries densely adherent to multiple adherent redundant loops of rectosigmoid colon (no evidence of obstruction), and posterior uterus. Thin plaque of tumor along rectosigmoid colon and 3x3cm nodule at base of rectosigmoid mesentery(residual). Tumor nodules along the bladder reflection (resected).  No other disease noted on full abdominopelvic survey. Optimal debulking would have required debulking the porta hepatis, rectosigmoid resection, peritoneal stripping in many locations. 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 Consideration of bevacizumab versus enrollment in ATHENA trial as maintenance. Referral to genetics. BRCA immunostaining pending        07/18/2017 - 11/17/2017 Chemotherapy    Chemotherapy Treatment    Treatment Goal Curative   Line of Treatment First Line   Plan Name OP OVARIAN PACLITAXEL/CARBOPLATIN   Start Date 07/21/2017   End Date 11/17/2017   Provider Ermelinda Das, MD   Chemotherapy dexamethasone (DECADRON) tablet 12 mg, 12 mg, Oral, Once, 6 of 6 cycles  Administration: 12 mg (07/21/2017), 12 mg (08/11/2017), 12 mg (09/05/2017), 12 mg (09/29/2017), 12 mg (10/28/2017), 12 mg (11/17/2017)  CARBOplatin (PARAPLATIN) 580.8 mg in sodium chloride (NS) 0.9 % 100 mL IVPB, 580.8 mg (114.2 % of original dose 508.8 mg), Intravenous, Once, 6 of 6 cycles  Dose modification:   (original dose 508.8 mg, Cycle 1)  Administration: 580.8 mg (07/21/2017), 528.6 mg (08/11/2017), 517.8 mg (09/05/2017), 529.8 mg (09/29/2017), 493.2 mg (10/28/2017), 498 mg (11/17/2017)  PACLItaxel (TAXOL) 286.98 mg in sodium chloride 0.9% NON-PVC (NS) 0.9 % 500 mL IVPB, 175 mg/m2 = 286.98 mg, Intravenous, Once, 6 of 6 cycles  Administration: 286.98 mg (07/21/2017), 285.24 mg (08/11/2017), 276.48 mg (09/05/2017), 278.28 mg (09/29/2017), 281.76 mg (10/28/2017), 283.5 mg (11/17/2017)          01/16/2018 Genetics    [employer] panel. Genetic testing is negative.     08/25/2018 Recurrence    Elevated CA-125 and imaging consistent with recurrent disease.       09/04/2018 - 06/07/2019 Chemotherapy    OP OVARIAN DOXORUBICIN LIPOSOMAL/CARBOPLATIN  DOXOrubicin Liposome 30 mg/m2 IV on day 1, CARBOplatin AUC 5 IV on day 1, every 28 days       08/20/2019 - 03/13/2020 Chemotherapy    niraparib     04/20/2020 - 05/26/2020 Chemotherapy    olaparib     05/15/2020 Recurrence    + ct in ER with liver and peritoneal metastasis. We were not aware of the scan until 06/05/20.       06/12/2020 -  Chemotherapy    OP CERVICAL PEMBROLIZUMAB  pembrolizumab 200 mg IV on day 1, every 21 days         Obstetric History: [id number]    Past Medical History:  Past Medical History:   Diagnosis Date    ADHD (attention deficit hyperactivity disorder)     Anxiety     At risk for falls     Baker's cyst     Fractures     Hypersomnia     Hypertension     Injury of posterior cruciate ligament     Insomnia     Irritable colon 07/10/2010    Joint pain     Joint replaced     Major depressive disorder     OSA (obstructive sleep apnea)     Osteoarthritis     Osteoporosis  Ovarian cancer (CMS-HCC) 07/03/2017    Personality disorder (CMS-HCC)     Borderline Personality Disorder    PLMD (periodic limb movement disorder)     Tear of meniscus of knee     Urge incontinence 11/12/2012 Vestibular dysfunction 09/16/2013       Past Surgical History:  Past Surgical History:   Procedure Laterality Date    CESAREAN SECTION  1981    FINGER FRACTURE SURGERY Right 10/2013    thumb    FOOT SURGERY Bilateral 9/00, 01/14    hardware    IR INSERT PORT AGE GREATER THAN 5 YRS  07/04/2017    IR INSERT PORT AGE GREATER THAN 5 YRS 07/04/2017 [name], MD IMG [facility]IR [facility]    PR BIL SALP-OOPH W/OMENTECT,TAH,RAD DISSECT Bilateral 07/01/2017    Procedure: BILATERAL SALPINGO-OOPHORECTOMY W/OMENTECTOMY, TOTAL ABD HYSTERECTOMY & RADICAL DISSECTION FOR DEBULKING;  Surgeon: Ermelinda Das, MD;  Location: MA[facility];  Service: Gynecology Oncology    PR LAP,DIAGNOSTIC ABDOMEN N/A 07/01/2017    Procedure: Laparoscopy, Abdomen, Peritoneum, & Omentum, Diagnostic, W/Wo Collection Specimen(S) By Brushing Or Washing;  Surgeon: Ermelinda Das, MD;  Location: MAIN OR [facility];  Service: Gynecology Oncology    REPLACEMENT TOTAL KNEE Bilateral 08/27/2004    TONSILLECTOMY  1970    WRIST FRACTURE SURGERY Right 1998    wrist       Allergies:  Codeine and Sulfa (sulfonamide antibiotics)    Medications:  Current Facility-Administered Medications   Medication Dose Route Frequency Provider Last Rate Last Admin    nitroglycerin (NITROSTAT) SL tablet 0.4 mg  0.4 mg Sublingual Q5 Min PRN Turkey C Fenton   0.4 mg at 08/05/20 1644     Current Outpatient Medications   Medication Sig Dispense Refill    acetaminophen (TYLENOL) 325 MG tablet Take 2 tablets (650 mg total) by mouth every four (4) hours as needed. 120 tablet 2    alendronate (FOSAMAX) 70 MG tablet Take 70 mg by mouth every seven (7) days.      ARIPiprazole (ABILIFY) 10 MG tablet Take 10 mg by mouth daily.       ascorbic acid, vitamin C, (VITAMIN C) 1000 MG tablet Take 1,000 mg by mouth daily.       atorvastatin (LIPITOR) 10 MG tablet Take 1 tablet (10 mg total) by mouth nightly. 90 tablet 3    CALCIUM ORAL Take by mouth daily.       cholecalciferol, vitamin D3, (VITAMIN D3) 1,000 unit capsule Take 1,000 Units by mouth daily.       cyclophosphamide (CYTOXAN) 50 mg capsule Take 1 capsule (50 mg total) by mouth daily . 30 capsule 5    latanoprost (XALATAN) 0.005 % ophthalmic solution Administer 1 drop to both eyes nightly.       megestroL (MEGACE) 400 mg/10 mL (40 mg/mL) suspension Take 200 mg by mouth daily.      metoclopramide (REGLAN) 10 MG tablet Take 1 tablet (10 mg total) by mouth Three (3) times a day as needed. 90 tablet 1    mirtazapine (REMERON) 7.5 MG tablet Take 1 tablet (7.5 mg total) by mouth nightly. 90 tablet 3    multivitamin (TAB-A-VITE/THERAGRAN) per tablet Take 1 tablet by mouth daily. 120 tablet 2    omeprazole (PRILOSEC) 20 MG capsule Take 1 capsule (20 mg total) by mouth daily. 90 capsule 3    oxybutynin (DITROPAN) 5 MG tablet Take 1 tablet (5 mg total) by mouth Three (3) times a day. 90  tablet 3    oxyCODONE (ROXICODONE) 5 MG immediate release tablet Take 1 tablet (5 mg total) by mouth every four (4) hours as needed for pain. 25 tablet 0    senna (SENOKOT) 8.6 mg tablet Take 2 tablets by mouth nightly as needed for constipation. 120 tablet 2    venlafaxine (EFFEXOR-XR) 150 MG 24 hr capsule Take 2 capsules (300 mg total) by mouth every morning. 180 capsule 3       Past Social History:  Social History     Tobacco Use    Smoking status: Never Smoker    Smokeless tobacco: Never Used   Vaping Use    Vaping Use: Never used   Substance Use Topics    Alcohol use: Yes     Alcohol/week: 1.0 - 2.0 standard drink     Types: 1 - 2 Shots of liquor per week    Drug use: No        Family History:  family history includes Arthritis in her father, mother, and sister; Cancer in her father; Depression in her son; Personality disorder in her sister.    Immunizations:   Immunization History   Administered Date(s) Administered    COVID-19 VACC,MRNA,(PFIZER)(PF)(IM) 05/18/2019, 07/06/2019, 12/16/2019    INFLUENZA INJ MDCK PF, QUAD,(FLUCELVAX)(26MO AND UP EGG FREE) 12/31/2019 INFLUENZA TIV (TRI) PF (IM) 01/29/2011, 01/27/2012    Influenza Vaccine Quad (IIV4 PF) 59mo+ injectable 03/10/2016, 01/20/2018, 12/17/2018    Influenza Virus Vaccine, unspecified formulation 01/07/2017    Influenza, High Dose (IIV4) 65 yrs & older 02/12/2013, 01/27/2014, 03/28/2015    PNEUMOCOCCAL POLYSACCHARIDE 23 11/12/2012    Pneumococcal Conjugate 13-Valent 02/12/2013    TdaP 07/26/2012, 12/22/2019       Review of Systems:  A comprehensive review of 10 systems was negative except for pertinent positives noted in HPI.    PHYSICAL EXAM     Temp:  [37 ??C] 37 ??C  Heart Rate:  [85-99] 85  SpO2 Pulse:  [84] 84  Resp:  [16-24] 18  BP: (119-134)/(63-78) 119/65  SpO2:  [96 %-99 %] 97 %    -General:   Chronically ill, cachectic appearing, in NAD. Intermittently grimacing and itching arms   -CV:    RRR. No m/r/g. Port site accessed, covered in clean dressing  -Pulm:   CTAB. Good air movement without w/r/r. Normal WOB.  -Abd:    Soft, mildly tender but nonrigid abdomen without guarding or rebound. Hypoactive bowel sounds. Some fullness throughout but without discrete masses.   -Extremities:   Bilateral 2+ pitting edema to above the knee, previously noted    -Psych:   Reports mood is piss poor. Conversant and appropriate. The patient is awake and alert.  -GU:    deferred    LABS AND IMAGING     All lab results last 24 hours:    Recent Results (from the past 24 hour(s))   ECG 12 Lead    Collection Time: 08/05/20  4:02 PM   Result Value Ref Range    EKG Systolic BP  mmHg    EKG Diastolic BP  mmHg    EKG Ventricular Rate 86 BPM    EKG Atrial Rate 86 BPM    EKG P-R Interval 164 ms    EKG QRS Duration 76 ms    EKG Q-T Interval 458 ms    EKG QTC Calculation 548 ms    EKG Calculated P Axis 77 degrees    EKG Calculated R Axis 70 degrees    EKG  Calculated T Axis -147 degrees    QTC Fredericia 516 ms   Comprehensive Metabolic Panel    Collection Time: 08/05/20  4:13 PM   Result Value Ref Range    Sodium 135 135 - 145 mmol/L Potassium 3.3 (L) 3.4 - 4.8 mmol/L    Chloride 103 98 - 107 mmol/L    Anion Gap 13 5 - 14 mmol/L    CO2 19.3 (L) 20.0 - 31.0 mmol/L    BUN 32 (H) 9 - 23 mg/dL    Creatinine 4.69 (H) 0.60 - 0.80 mg/dL    BUN/Creatinine Ratio 18     EGFR CKD-EPI Non-African American, Female 28 (L) >=60 mL/min/1.76m2    EGFR CKD-EPI African American, Female 32 (L) >=60 mL/min/1.33m2    Glucose 134 70 - 179 mg/dL    Calcium 7.9 (L) 8.7 - 10.4 mg/dL    Albumin 2.6 (L) 3.4 - 5.0 g/dL    Total Protein 5.5 (L) 5.7 - 8.2 g/dL    Total Bilirubin 1.1 0.3 - 1.2 mg/dL    AST 41 (H) <=62 U/L    ALT 26 10 - 49 U/L    Alkaline Phosphatase 206 (H) 46 - 116 U/L   Lactate Sepsis, Venous    Collection Time: 08/05/20  4:13 PM   Result Value Ref Range    Lactate, Venous 3.0 (H) 0.5 - 1.8 mmol/L   Lipase Level    Collection Time: 08/05/20  4:13 PM   Result Value Ref Range    Lipase 22 12 - 53 U/L   Magnesium Level    Collection Time: 08/05/20  4:13 PM   Result Value Ref Range    Magnesium 1.3 (L) 1.6 - 2.6 mg/dL   hsTroponin I (single, no delta)    Collection Time: 08/05/20  4:13 PM   Result Value Ref Range    hsTroponin I 18 <=34 ng/L   B-type Natriuretic Peptide    Collection Time: 08/05/20  4:13 PM   Result Value Ref Range    BNP 181.48 (H) <=100 pg/mL   CBC w/ Differential    Collection Time: 08/05/20  4:13 PM   Result Value Ref Range    WBC 8.4 3.6 - 11.2 10*9/L    RBC 2.56 (L) 3.95 - 5.13 10*12/L    HGB 9.5 (L) 11.3 - 14.9 g/dL    HCT 95.2 (L) 84.1 - 44.0 %    MCV 111.4 (H) 77.6 - 95.7 fL    MCH 36.9 (H) 25.9 - 32.4 pg    MCHC 33.1 32.0 - 36.0 g/dL    RDW 32.4 (H) 40.1 - 15.2 %    MPV 9.2 6.8 - 10.7 fL    Platelet 76 (L) 150 - 450 10*9/L    nRBC 0 <=4 /100 WBCs    Neutrophils % 84.1 %    Lymphocytes % 4.8 %    Monocytes % 10.6 %    Eosinophils % 0.2 %    Basophils % 0.3 %    Absolute Neutrophils 7.0 1.8 - 7.8 10*9/L    Absolute Lymphocytes 0.4 (L) 1.1 - 3.6 10*9/L    Absolute Monocytes 0.9 (H) 0.3 - 0.8 10*9/L    Absolute Eosinophils 0.0 0.0 - 0.5 10*9/L    Absolute Basophils 0.0 0.0 - 0.1 10*9/L    Macrocytosis Slight (A) Not Present   ECG 12 Lead    Collection Time: 08/05/20  4:41 PM   Result Value Ref Range    EKG Systolic BP  mmHg  EKG Diastolic BP  mmHg    EKG Ventricular Rate 83 BPM    EKG Atrial Rate 83 BPM    EKG P-R Interval 166 ms    EKG QRS Duration 74 ms    EKG Q-T Interval 480 ms    EKG QTC Calculation 564 ms    EKG Calculated P Axis 46 degrees    EKG Calculated R Axis 16 degrees    EKG Calculated T Axis -153 degrees    QTC Fredericia 535 ms   Lactate Sepsis, Venous    Collection Time: 08/05/20  5:51 PM   Result Value Ref Range    Lactate, Venous 3.0 (H) 0.5 - 1.8 mmol/L   Type and Screen    Collection Time: 08/05/20  5:51 PM   Result Value Ref Range    Blood Type A POS     Antibody Screen NEG    COVID-19 PCR    Collection Time: 08/05/20  5:54 PM    Specimen: Nasopharyngeal Swab   Result Value Ref Range    SARS-CoV-2 PCR Negative Negative   hsTroponin I (serial 2-6H CONTINUATION w/ delta)    Collection Time: 08/05/20  6:10 PM   Result Value Ref Range    hsTroponin I 15 <=34 ng/L    delta hsTroponin I 3 <=7 ng/L   Urinalysis with Culture Reflex    Collection Time: 08/05/20  7:26 PM    Specimen: Clean Catch; Urine   Result Value Ref Range    Color, UA Yellow     Clarity, UA Clear     Specific Gravity, UA 1.015 1.005 - 1.040    pH, UA 5.0 5.0 - 9.0    Leukocyte Esterase, UA Trace (A) Negative    Nitrite, UA Negative Negative    Protein, UA Trace (A) Negative    Glucose, UA Negative Negative    Ketones, UA Negative Negative    Urobilinogen, UA 0.2 mg/dL     Bilirubin, UA Negative Negative    Blood, UA Negative Negative    RBC, UA 0 <4 /HPF    WBC, UA 15 (H) 0 - 5 /HPF    Squam Epithel, UA 5 0 - 5 /HPF    Bacteria, UA Occasional (A) None Seen /HPF   Lactate Sepsis, Venous    Collection Time: 08/05/20  8:04 PM   Result Value Ref Range    Lactate, Venous 2.5 (H) 0.5 - 1.8 mmol/L     ECG 12 Lead    Result Date: 08/05/2020  SINUS RHYTHM WITH OCCASIONAL PREMATURE VENTRICULAR BEATS ST & T WAVE ABNORMALITY, CONSIDER INFERIOR ISCHEMIA ST & T WAVE ABNORMALITY, CONSIDER ANTEROLATERAL ISCHEMIA PROLONGED QT ABNORMAL ECG WHEN COMPARED WITH ECG OF 05-Aug-2020 16:02, NO SIGNIFICANT CHANGE WAS FOUND Confirmed by Mariane Baumgarten ([id number]) on 08/05/2020 7:46:17 PM    ECG 12 Lead    Result Date: 08/05/2020  SINUS RHYTHM WITH OCCASIONAL PREMATURE VENTRICULAR BEATS ST & T WAVE ABNORMALITY, CONSIDER INFERIOR ISCHEMIA ST & T WAVE ABNORMALITY, CONSIDER ANTEROLATERAL ISCHEMIA PROLONGED QT ABNORMAL ECG WHEN COMPARED WITH ECG OF 24-Oct-2019 23:04, SIGNIFICANT CHANGES HAVE OCCURRED Confirmed by Mariane Baumgarten ([id number]) on 08/05/2020 7:45:15 PM    XR Chest Portable    Result Date: 08/05/2020  EXAM: XR CHEST PORTABLE DATE: 08/05/2020 4:00 PM ACCESSION: [id number] UN DICTATED: 08/05/2020 4:22 PM INTERPRETATION LOCATION: [facility] CLINICAL INDICATION: 75 year old female with other.  COMPARISON: March 24. TECHNIQUE: Single  frontal view of the chest. FINDINGS: The lungs remain clear with no pleural fluid.  Catheter cavoatrial junction.     No acute changes.     CT Abdomen Pelvis W IV Contrast Only    Result Date: 08/05/2020  EXAM: CT ABDOMEN PELVIS W CONTRAST DATE: 08/05/2020 6:29 PM ACCESSION: [id number] UN DICTATED: 08/05/2020 6:54 PM INTERPRETATION LOCATION: [facility] CLINICAL INDICATION: abd pain  COMPARISON: 07/06/2020 TECHNIQUE: Contiguous axial CT images of the abdomen and pelvis were performed following intravenous administration of contrast from the lung bases to the pubic symphysis. Sagittal and coronal reconstructions were provided for review. FINDINGS: LOWER THORAX: Please refer to concurrent CTA chest for findings above the diaphragm LIVER: Innumerable hypoattenuating hepatic lesions, slightly increased in size compared to prior with reference lesions measuring 7.2 x 5.1 cm in segment 7, previously 6.3 x 5.2 cm (2:27), and 5.5 x 4.9 cm in segment 3, previously 4.4 x 4.2 cm (2:44). These lesions again demonstrates significant extrinsic compression of the intrahepatic IVC and hepatic veins with nonvisualization of a short intrahepatic segment, similar to prior No intrahepatic biliary dilation. The portal veins appear patent. Poor visualization of the hepatic veins. GALLBLADDER/BILIARY: Hydropic gallbladder without wall thickening or pericholecystic fluid, increased in size compared to. Common bile duct is not dilated. Unchanged prominence of the cystic duct.  SPLEEN: No splenomegaly. No suspicious mass. PANCREAS: No focal masses or ductal dilation. ADRENALS: Normal size without suspicious nodules. KIDNEYS: Normal size with symmetric enhancement. Delayed left nephrogram with moderate left hydroureteronephrosis due to mass effect from left pelvic mass described below, similar to prior. Similar patchy enhancement of the right kidney. GI TRACT: No bowel obstruction. Appendix is normal. Fistulization of sigmoid colon with the known pelvic mass, detailed below. PERITONEUM/RETROPERITONEUM AND MESENTERY: Trace pelvic free fluid with thickening along the presacral space, similar to prior. Lower abdominal mesenteric implant measuring 2.2 x 2.67 cm, previously 1.9 x 2.1 cm (2:89). Enlarged left pelvic sidewall lymph node measuring 2.6 x 2.1 cm, previously 2.5 x 1.9 cm (2:105). VESSELS: The aorta is normal in caliber. There are scattered atherosclerotic calcifications of the aorta and branch vessels. PELVIS/BLADDER: Mild circumferential bladder wall thickening, similar to prior. REPRODUCTIVE: Status post hysterectomy and bilateral salpingo-oophorectomies. Redemonstrated pelvic mass abutting the vaginal cuff which now contains locules of gas throughout and measures approximately 6.0 x 5.6 x 5.0 cm (2:101, 5:71). The mass appears to have fistulized with an adjacent loop of thickened/inflamed rectosigmoid colon (4:39). BONES AND SOFT TISSUES: Multilevel degenerative changes of the visualized spine. No suspicious osseous lesions.     -- Status post hysterectomy and bilateral salpingo-oophorectomy for ovarian malignancy with large pelvic mass abutting the vaginal cuff which now contains internal debris and locules of gas, consistent with recurrent malignancy now with fistulization to an adjacent loop of rectosigmoid colon. -- Worsening left pelvic sidewall lymphadenopathy and hepatic metastases and mesenteric implants consistent with disease progression. -- Unchanged left moderate hydroureteronephrosis secondary to extrinsic compression of the distal left ureter from the pelvic mass. -- Mild circumferential bladder wall thickening which can be seen with cystitis. The findings of this study were discussed via telephone with DR. Ander Slade HALLMARK by Dr. Ollen Bowl on 08/05/2020 7:35 PM.     CTA Chest W Contrast    Result Date: 08/05/2020  EXAM: CTA CHEST W CONTRAST DATE: 08/05/2020 6:29 PM ACCESSION: [id number] UN DICTATED: 08/05/2020 6:48 PM INTERPRETATION LOCATION: [facility] CLINICAL INDICATION: 75 years old female with dyspnea.  COMPARISON: 07/06/2020. TECHNIQUE: A helical CTA scan was obtained with IV contrast from the lung apices to the lung bases. Images were reconstructed in the axial  plane. Multiplanar reformatted and MIP images were provided. FINDINGS: PULMONARY ARTERIES: No pulmonary embolism. AIRWAYS, LUNGS, PLEURA: Clear central tracheobronchial tree.  Bibasilar atelectasis. No focal consolidation. No pleural effusion. Respiratory motion limits assessment of the lung bases for small nodules or focal abnormalities. MEDIASTINUM: Normal heart size. No pericardial effusion.  Normal caliber thoracic aorta.  No enlarged intrathoracic or axillary lymph nodes. IMAGED ABDOMEN: . Please refer to concurrent CT abdomen/pelvis for findings below the diaphragm. SOFT TISSUES: Unremarkable. BONES: Mild S-shaped curvature of the thoracolumbar spine. Multilevel degenerative changes of the visualized spine. Stable upper sternal cortical deformity with intramedullary lucency (8:73). Remote lateral left rib fractures.     -- No emboli and no acute findings.    Radiology studies were personally reviewed    I saw and evaluated Ms. Amie Critchley , participating in the key portions of the service with the resident and fellow.  I reviewed the resident???s note and agree with the findings, assessment and plan as outlined in this note.    Amie Critchley expresses the assessment that now is the time for hospice.  Reports a recent fall and generalized malaise.  Was informed of imaging findings of progression    Doreatha Lew  MD  ATTENDING  Gynecologic Oncology

## 2020-08-06 NOTE — Unmapped (Signed)
Consult received. Chart reviewed. Per chart, patient is on comfort measures only. Nutrition intervention not warranted at this time. Will follow up PRN.     Karie Chimera, MS, RD, LDN, CNSC  Pager # [phone]

## 2020-08-06 NOTE — Unmapped (Signed)
Care Management  Initial Transition Planning Assessment    CM met with patient in pt room.  Pt was not but visitors was wearing hospital provided masks for the duration of the interaction with CM. CM was wearing hospital provided surgical mask and hospital provided eye protection.  CM was within 6 foot of the patient/visitors during this interaction.     Patient has been identified as a Runner, broadcasting/film/video.  Requested that a  timely follow-up appointment be scheduled and documented prior to discharge.  Longitudinal Plan of Care reviewed, the following information is noted for continuity of care:  Per EMR, pt is a 75 y.o. female with a past medical history of ovarian cancer (stage III, on chemo), HTN, OA, borderline personality disorder, anxiety, and depression presenting for evaluation of multiple complaints. The patient reports that she is having a severe anxiety attack and she feels that she has had a lot of fluid on her legs for the past few weeks. Additionally, she notes diffuse pruritis which has been chronic.     Pt was dc 07/22/20 arranged w/ [facility] for RN, PT and OT. Pt report she is needing help at home w/ her ADL's. She also report in a process of exchanging rollator size. Husband is her primary support and ride home. Pt okay to resume with [facility].                  General  Care Manager assessed the patient by : In person interview with patient, Medical record review  Orientation Level: Oriented X4  Functional level prior to admission: Partially Assisted  Who provides care at home?: Family member  Level of assistance required: Transferring, Toileting, Bathing, Walking  Reason for referral: Discharge Planning     Type of Residence: Mailing Address:  [address]  [place] [address]  Contacts: Accompanied by: Family member  Patient Phone Number: [phone] (home) [phone] cell           Medical Provider(s): Othelia Pulling, MD  Reason for Admission: Admitting Diagnosis: No admission diagnoses are documented for this encounter.  Past Medical History:   has a past medical history of ADHD (attention deficit hyperactivity disorder), Anxiety, At risk for falls, Baker's cyst, Fractures, Hypersomnia, Hypertension, Injury of posterior cruciate ligament, Insomnia, Irritable colon (07/10/2010), Joint pain, Joint replaced, Major depressive disorder, OSA (obstructive sleep apnea), Osteoarthritis, Osteoporosis, Ovarian cancer (CMS-HCC) (07/03/2017), Personality disorder (CMS-HCC), PLMD (periodic limb movement disorder), Tear of meniscus of knee, Urge incontinence (11/12/2012), and Vestibular dysfunction (09/16/2013).  Past Surgical History:   has a past surgical history that includes Cesarean section (1981); Wrist fracture surgery (Right, 1998); Foot surgery (Bilateral, 9/00, 01/14); Replacement total knee (Bilateral, 08/27/2004); Finger fracture surgery (Right, 10/2013); Tonsillectomy (1970); pr lap,diagnostic abdomen (N/A, 07/01/2017); pr bil salp-ooph w/omentect,tah,rad dissect (Bilateral, 07/01/2017); and IR Insert Port Age Greater Than 5 Years (07/04/2017).   Previous admit date: 07/20/2020      Medical Provider(s): Othelia Pulling, MD  Primary Insurance- Payor: MEDICARE / Plan: MEDICARE PART A AND PART B / Product Type: *No Product type* /   Secondary Insurance ??? Secondary Insurance  BCBS  Prescription Coverage ???   Preferred Pharmacy - [id number] - [place], [place] - [address]  [id number] - , [place] - [address]  [facility] - [place], [place] - [address]  [facility] - , [place] - [address]  At this time the patient is able to make  her own decisions.  Transportation home: Private vehicle  Level of function prior to admission: Requires Assistance    Reason for Admission: Admitting Diagnosis:  No admission diagnoses are documented for this encounter.    Previous admit date: 07/20/2020      Contact/Decision Maker  Extended Emergency Contact Information  Primary Emergency Contact: Ashley Murrain  Address: [address]           [place], [place] [address] [place]  Home Phone: [phone]  Relation: Spouse  Secondary Emergency Contact: Rushie Nyhan  Mobile Phone: [phone]  Relation: Son    Legal Next of Kin / Guardian / POA / Advance Directives     HCDM (patient stated preference): Ashley Murrain - Spouse - [phone]    Advance Directive (Medical Treatment)  Does patient have an advance directive covering medical treatment?: Patient has advance directive covering medical treatment, copy not in chart.  Advance directive covering medical treatment not in Chart:: Copy requested from family    Health Care Decision Maker [HCDM] (Medical & Mental Health Treatment)  Healthcare Decision Maker: HCDM documented in the HCDM/Contact Info section.    Advance Directive (Mental Health Treatment)  Does patient have an advance directive covering mental health treatment?: Patient has advance directive covering mental health treatment, copy not in chart.  Advance directive covering mental health treatment not in Chart:: Copy requested from family.  Reason patient does not have an advance directive covering mental health treatment:: HCDM documented in the HCDM/Contact Info section.    Patient Information  Lives with: Spouse/significant other  Type of Residence: Private residence   Location/Detail: [place] Pt lives in 1 level home has 1 steps  Support Systems/Concerns: Spouse, Children  Responsibilities/Dependents at home?: No  Home Care services in place prior to admission?: Yes  Type of Home Care services in place prior to admission: Home nursing visits, Home PT, Home OT  Current Home Care provider (Name/Phone #): [facility]  Equipment Currently Used at Home: cane, quad, walker, rolling, other (see comments) (Pt in a prcoess of exchanging her rollator)   Currently receiving outpatient dialysis?: No     Financial Information     Need for financial assistance?: No     Social Determinants of Health  Social Determinants of Health     Tobacco Use: Low Risk    ??? Smoking Tobacco Use: Never Smoker   ??? Smokeless Tobacco Use: Never Used   Alcohol Use: Not At Risk   ??? How often do you have a drink containing alcohol?: 2 - 4 times per month   ??? How many drinks containing alcohol do you have on a typical day when you are drinking?: 1 - 2   ??? How often do you have 5 or more drinks on one occasion?: Never   Financial Resource Strain: Low Risk    ??? Difficulty of Paying Living Expenses: Not hard at all   Food Insecurity: No Food Insecurity   ??? Worried About Programme researcher, broadcasting/film/video in the Last Year: Never true   ??? Ran Out of Food in the Last Year: Never true   Transportation Needs: No Transportation Needs   ??? Lack of Transportation (Medical): No   ??? Lack of Transportation (Non-Medical): No   Physical Activity: Unknown   ??? Days of Exercise per Week: 0 days   ??? Minutes of Exercise per Session: Not on file  Stress: No Stress Concern Present   ??? Feeling of Stress : Not at all   Social Connections: Socially Integrated   ??? Frequency of Communication with Friends and Family: More than three times a week   ??? Frequency of Social Gatherings with Friends and Family: More than three times a week   ??? Attends Religious Services: More than 4 times per year   ??? Active Member of Clubs or Organizations: Yes   ??? Attends Banker Meetings: More than 4 times per year   ??? Marital Status: Married   Catering manager Violence: Not At Risk   ??? Fear of Current or Ex-Partner: No   ??? Emotionally Abused: No   ??? Physically Abused: No   ??? Sexually Abused: No   Depression: Not at risk   ??? PHQ-2 Score: 0   Housing/Utilities: Low Risk    ??? Within the past 12 months, have you ever stayed: outside, in a car, in a tent, in an overnight shelter, or temporarily in someone else's home (i.e. couch-surfing)?: No   ??? Are you worried about losing your housing?: No   ??? Within the past 12 months, have you been unable to get utilities (heat, electricity) when it was really needed?: No   Substance Use: Low Risk    ??? Taken prescription drugs for non-medical reasons: Never   ??? Taken illegal drugs: Never   ??? Patient indicated they have taken drugs in the past year for non-medical reasons: Yes, [positive answer(s)]: Not on file   Health Literacy: Low Risk    ??? : Never       Discharge Needs Assessment  Concerns to be Addressed: discharge planning, adjustment to diagnosis/illness    Clinical Risk Factors: > 65, Multiple Diagnoses (Chronic), Principal Diagnosis: Cancer, Stroke, COPD, Heart Failure, AMI, Pneumonia, Joint Replacment, History of Falls, Functional Limitations, Readmission < 30 Days  Barriers to taking medications: No  Prior overnight hospital stay or ED visit in last 90 days: Yes  Readmission Within the Last 30 Days: previous discharge plan unsuccessful  Anticipated Changes Related to Illness: none  Equipment Needed After Discharge: none    Discharge Facility/Level of Care Needs: other (see comments) (Home w/ [facility])    Readmission  Risk of Unplanned Readmission Score:  %  Predictive Model Details   No score data available for [facility] Risk of Unplanned Readmission     Readmitted Within the Last 30 Days? (No if blank)        Discharge Plan  Screen findings are: Discharge planning needs identified or anticipated (Comment). (ROC w/ [facility])    Expected Discharge Date: TBD    Expected Transfer from Critical Care:  NA    Quality data for continuing care services shared with patient and/or representative?: Yes  Patient and/or family were provided with choice of facilities / services that are available and appropriate to meet post hospital care needs?: Yes   List choices in order highest to lowest preferred, if applicable. : Per pt okay to resume w/ [facility]    Initial Assessment complete?: Yes      Personal Health Advocate Pool contacted by In Basket message regarding post-acute services and collaboration around plan of care.

## 2020-08-06 NOTE — Unmapped (Shared)
-----------------------------------------    5:40 PM (August 05, 2020)  -----------------------------------------  Assumed pt care from previous resident. Briefly, this is a 75 y.o. female with a pmhx of ovarian cancer (stage III, on chemo), HTN, OA, borderline personality disorder, anxiety, and depression who presents with new epigastric abdominal pain onset last night with associated nausea in the setting of feeling like she is having a severe anxiety attack. During her interview, she had an emetic episode. She also reports right-sided back pain. Husband also reports that her short-term memory has been noticeably worsening and has been reapeating herself a lot lately.     BP 134/63  - Pulse 99  - Temp 37 ??C (98.6 ??F) (Oral)  - Resp 24  - SpO2 96%     Workup:  --Given IV morphine, IV benadryl, IV zofran. ECG showed new diffuse T wave inversions in anterior, inferior, and lateral leads concerning for possible coronary lesion vs vasospasm 2/2 chemotherapy. Given ASA and NTG. Repeat ECG showed diffuse T waves in inferior leads and anterolateral leads I, aVL, V3 through V6. Troponin wnl. Cardiology consulted and will evaluate patient for concern with Takotsubo's. BNP elevated to 181. Stable anemia hemoglobin 9.5. AKI creatinine 1.76. Hypocalcemia 7.9. Hypomagnesemia 1.3. Hypokalemia 3.3. Normal T bili. Normal lipase 22, pancreatitis less likely. CXR negative. Given IV magnesium sulfate and 0.5L LR.     PLAN:  -- Pending CTA Chest w/ contrsat and CT AP at time of signout. Will admit.     7:38 PM  Spoke with radiologist who reported worsening pelvic mass with fistula. Will consult gynecology.     7:59 PM  Discussed case with gynecologist consultant. They accepted patient for gynecology oncology admission.     CTA Chest W Contrast   Preliminary Result   -- No pulmonary embolism or acute airspace disease.      XR Chest Portable   Final Result   No acute changes.         ED POCUS Chest Bilateral    (Results Pending)   CT Abdomen Pelvis W IV Contrast Only    (Results Pending)       Documentation assistance was provided by Fabian Sharp on August 05, 2020 at 6:52 PM for Jarvis Morgan, MD.    {*** NOTE TO PROVIDER: PLEASE ADD .EDPROVSCRIBEATTEST NOTING YOU AGREE WITH SCRIBE DOCUMENTATION}

## 2020-08-06 NOTE — Unmapped (Signed)
GYN ONC PROGRESS NOTE    Assessment and Plan:  Lisa Burton is a 75 y.o. 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 After goals of care discussion this AM with patient, plan for home hospice. Patient is already DNR/DNI and will pursue comfort measures only.    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 Worsening L pelvic sidewall lymphadenopathy and hepatic metastases and mesenteric implants consistent with disease progression. Unchanged L moderate hydroureteronephrosis 2/2 extrinsic compression of distal L ureter from pelvic mass. Mild circumferential bladder wall thickening which can be seen with cystitis.   - Last Ca-125 (07/24/20): 176 (up from 11 on 3/25)   - Patient reports understanding of her disease progression is states that she is dying. Plans to move forward with home hospice.  ??  Nausea/vomiting + FTT   - Home meds: reglan 10mg  TID + zofran 4mg  q8hr > reportedly not taking either at home with acutely worsening symptoms. Given QTC prolongation on EKG from last night, compazine ordered, will add back other antiemetics as needed for comfort  - Omeprazole 20mg  daily    - Remeron 7.5mg  nightly  - Albumin worsened to 2.6 on admission   - Palliative care consulted, appreciate recommendations    Cancer-related pain   - Tylenol 650mg  every 6 hours, Oxycodone 5-10mg  every 4 hours; dilaudid IV 0.5mg  PRN severe pain   - Palliative care consulted, appreciate recommendations    Lactate elevation    - Suspect due to volume status vs. Disease burden, low concern for infection given no fevers/chills, normal WBC   - Downtrending: 3 > 3 > 2.5 > repeat 2.6 > s/p bolus. Will discontinue labs due to comfort care only  ??  BNP elevation - QTc prolongation  - Clinically euvolemic though does have 2+ edema bilaterally (stable from prior), symmetric in lower extremities; cardiopulmonary examination benign. Low concern for acute cardiopulmonary process given lack of symptoms.  - BNP 181  - Trop trend 15 > 18 > 20 (all wnl)   - EKG with concern for QTC prolongation, T wave inversions though low concern for ACS given normal vitals, no SOB/CP/palpitations; telemetry discontinued given comfort care only  - Will restart QTc prolonging medications for comfort  ??  Pruritis   - Attributed to chemotherapy; no new rash, s/s anaphylaxis, denies any new exposures   - Atarax 25mg  PRN itching   ??  CKD - Hypomagnesemia - Hypokalemia    - Cr 1.76 (baseline 1.5-1.7) > repeat 1.76   - mIVF ordered given poor PO intake/nausea/vomiting   - Mg 1.3 > repleted in ED > 1.4 > s/p repletion   - K 3.3 > K added to mIVF > 3.2 > s/p repletion  - Will discontinue labs due to comfort care only  ??  HTN - HLD ??  - Lipitor 10mg  daily   - Previously on lisinopril, self-discontinued   ??  ADHD/MDD/Borderline   - Abilify 10mg  daily   - Effexor 300mg  daily ??  - Will restart these medications given current GOC  ??  Osteoporosis/chronic fracture    -  Reports not taking fosamax weekly     PPX:  - Bowel regimen and antiemetics prn  - SCDs, ambulation    Dispo: Floor pending home hospice    Plan discussed with Dr. Romeo Apple and Attending Dr. Nelly Rout.    Subjective:   Patient reports that her lip is swollen from falling at home. She reports feeling awful since starting chemo. She states that she is dying. She is open to planning for the end of life with hospice. She is interested in speaking with a Chief Strategy Officer. She is scared.     Objective:  Temp:  [36.5 ??C (97.7 ??F)-37 ??C (98.6 ??F)] 36.5 ??C (97.7 ??F)  Heart Rate:  [77-99] 89  SpO2 Pulse:  [84] 84  Resp:  [12-25] 18  BP: (119-148)/(63-91) 145/65  SpO2:  [96 %-99 %] 98 %    Gen: NAD, awake, alert, oriented  HEENT: Swollen/bruised upper right lip  CV: Regular rate  Pulm: Normal work of breathing  Abd: Soft, non-distended  GU: Deferred    Medications (scheduled)   ??? ARIPiprazole  10 mg Oral Daily   ??? atorvastatin  10 mg Oral Daily   ??? heparin (porcine) for subcutaneous use  5,000 Units Subcutaneous [facility]   ??? magnesium sulfate  2 g Intravenous Q2H   ??? mirtazapine  7.5 mg Oral Nightly   ??? pantoprazole  20 mg Oral Daily   ??? potassium chloride in water  20 mEq Intravenous Q1H   ??? sodium chloride 0.9%  500 mL Intravenous Once   ??? venlafaxine  300 mg Oral Daily       Medications (prn)  acetaminophen, HYDROmorphone, hydrOXYzine, nitroglycerin, oxyCODONE, prochlorperazine    Labs:   Lab Results   Component Value Date    WBC 8.5 08/06/2020    HGB 9.6 (L) 08/06/2020    HCT 27.5 (L) 08/06/2020    PLT 78 (L) 08/06/2020       Lab Results   Component Value Date    NA 132 (L) 08/06/2020    K 3.2 (L) 08/06/2020    CL 100 08/06/2020    CO2 21.0 08/06/2020    BUN 29 (H) 08/06/2020    CREATININE 1.76 (H) 08/06/2020    GLU 120 08/06/2020    CALCIUM 7.9 (L) 08/06/2020    MG 1.4 (L) 08/06/2020    PHOS 3.1 08/06/2020       Lab Results   Component Value Date    BILITOT 1.1 08/06/2020    BILIDIR <0.10 04/03/2020    PROT 5.4 (L) 08/06/2020    ALBUMIN 2.5 (L) 08/06/2020    ALT 25 08/06/2020    AST 42 (H) 08/06/2020    ALKPHOS 221 (H) 08/06/2020    GGT 15 04/27/2010       Lab Results   Component Value Date    INR 1.0 01/16/2011    APTT 30.2 01/16/2011     Scribe's Attestation: Georgana Curio, MD obtained and performed the history, physical exam and medical decision making elements that were entered into the chart. Documentation assistance was provided by me personally, a scribe. Signed by Lorenda Cahill, Scribe, on August 06, 2020 at 5:41 AM. ----------------------------------------------------------------------------------------------------------------------  August 06, 2020 8:48 AM. Documentation assistance provided by the Scribe. I was present during the time the encounter was recorded. The information recorded by the Scribe was done at my direction and has been reviewed and validated by me.  ----------------------------------------------------------------------------------------------------------------------

## 2020-08-07 MED ADMIN — acetaminophen (TYLENOL) tablet 1,000 mg: 1000 mg | ORAL | @ 13:00:00

## 2020-08-07 MED ADMIN — atorvastatin (LIPITOR) tablet 10 mg: 10 mg | ORAL | @ 13:00:00 | Stop: 2020-08-07

## 2020-08-07 MED ADMIN — ARIPiprazole (ABILIFY) tablet 10 mg: 10 mg | ORAL | @ 12:00:00

## 2020-08-07 MED ADMIN — mirtazapine (REMERON) tablet 7.5 mg: 7.5 mg | ORAL | @ 01:00:00

## 2020-08-07 MED ADMIN — oxybutynin (DITROPAN) tablet 5 mg: 5 mg | ORAL | @ 01:00:00

## 2020-08-07 MED ADMIN — pantoprazole (PROTONIX) EC tablet 20 mg: 20 mg | ORAL | @ 12:00:00

## 2020-08-07 MED ADMIN — oxybutynin (DITROPAN) tablet 5 mg: 5 mg | ORAL | @ 12:00:00

## 2020-08-07 MED ADMIN — oxybutynin (DITROPAN) tablet 5 mg: 5 mg | ORAL | @ 18:00:00

## 2020-08-07 MED ADMIN — venlafaxine (EFFEXOR-XR) 24 hr capsule 300 mg: 300 mg | ORAL | @ 13:00:00

## 2020-08-07 NOTE — Unmapped (Signed)
POC reviewed with pt; all questions answered. Transitioned to comfort measures. Pt reported pain well controlled with oxycodone and tylenol this shift. Continues to void. Bed alarm remains activated throughout shift. Up with 2 staff assist. Remains free from falls and other injury. Report given to next shift RN.    Problem: Adult Inpatient Plan of Care  Goal: Plan of Care Review  Outcome: Progressing  Goal: Patient-Specific Goal (Individualized)  Outcome: Progressing  Goal: Absence of Hospital-Acquired Illness or Injury  Outcome: Progressing  Intervention: Identify and Manage Fall Risk  Recent Flowsheet Documentation  Taken 08/06/2020 0701 by Roderic Ovens, RN  Safety Interventions:   fall reduction program maintained   lighting adjusted for tasks/safety   low bed   nonskid shoes/slippers when out of bed  Goal: Optimal Comfort and Wellbeing  Outcome: Progressing  Goal: Readiness for Transition of Care  Outcome: Progressing  Goal: Rounds/Family Conference  Outcome: Progressing     Problem: Self-Care Deficit  Goal: Improved Ability to Complete Activities of Daily Living  Outcome: Progressing     Problem: Impaired Wound Healing  Goal: Optimal Wound Healing  Outcome: Progressing     Problem: Fall Injury Risk  Goal: Absence of Fall and Fall-Related Injury  Outcome: Progressing  Intervention: Promote Injury-Free Environment  Recent Flowsheet Documentation  Taken 08/06/2020 0701 by Roderic Ovens, RN  Safety Interventions:   fall reduction program maintained   lighting adjusted for tasks/safety   low bed   nonskid shoes/slippers when out of bed

## 2020-08-07 NOTE — Unmapped (Addendum)
Pt A&Ox4, VSS, with pt on RA. Pain well managed with PRN oxy. Pt continues to void adequately. Bed alarm and fall precaution measures utilized. Needs met at this time, WCTM.   Problem: Adult Inpatient Plan of Care  Goal: Plan of Care Review  Outcome: Ongoing - Unchanged  Goal: Patient-Specific Goal (Individualized)  Outcome: Ongoing - Unchanged  Goal: Absence of Hospital-Acquired Illness or Injury  Outcome: Ongoing - Unchanged  Intervention: Identify and Manage Fall Risk  Recent Flowsheet Documentation  Taken 08/07/2020 0300 by Regino Schultze, RN  Safety Interventions:  ??? bed alarm  ??? fall reduction program maintained  ??? family at bedside  ??? lighting adjusted for tasks/safety  ??? low bed  ??? nonskid shoes/slippers when out of bed  Taken 08/07/2020 0100 by Regino Schultze, RN  Safety Interventions:  ??? bed alarm  ??? fall reduction program maintained  ??? family at bedside  ??? lighting adjusted for tasks/safety  ??? low bed  ??? nonskid shoes/slippers when out of bed  Taken 08/06/2020 2300 by Regino Schultze, RN  Safety Interventions:  ??? fall reduction program maintained  ??? family at bedside  ??? low bed  Taken 08/06/2020 2100 by Regino Schultze, RN  Safety Interventions:  ??? fall reduction program maintained  ??? bed alarm  ??? commode/urinal/bedpan at bedside  ??? lighting adjusted for tasks/safety  ??? low bed  ??? nonskid shoes/slippers when out of bed  Taken 08/06/2020 1900 by Regino Schultze, RN  Safety Interventions:  ??? bed alarm  ??? fall reduction program maintained  ??? family at bedside  ??? lighting adjusted for tasks/safety  ??? low bed  ??? nonskid shoes/slippers when out of bed  Goal: Optimal Comfort and Wellbeing  Outcome: Ongoing - Unchanged  Goal: Readiness for Transition of Care  Outcome: Ongoing - Unchanged  Goal: Rounds/Family Conference  Outcome: Ongoing - Unchanged     Problem: Self-Care Deficit  Goal: Improved Ability to Complete Activities of Daily Living  Outcome: Ongoing - Unchanged     Problem: Impaired Wound Healing  Goal: Optimal Wound Healing  Outcome: Ongoing - Unchanged     Problem: Fall Injury Risk  Goal: Absence of Fall and Fall-Related Injury  Outcome: Ongoing - Unchanged  Intervention: Promote Injury-Free Environment  Recent Flowsheet Documentation  Taken 08/07/2020 0300 by Regino Schultze, RN  Safety Interventions:  ??? bed alarm  ??? fall reduction program maintained  ??? family at bedside  ??? lighting adjusted for tasks/safety  ??? low bed  ??? nonskid shoes/slippers when out of bed  Taken 08/07/2020 0100 by Regino Schultze, RN  Safety Interventions:  ??? bed alarm  ??? fall reduction program maintained  ??? family at bedside  ??? lighting adjusted for tasks/safety  ??? low bed  ??? nonskid shoes/slippers when out of bed  Taken 08/06/2020 2300 by Regino Schultze, RN  Safety Interventions:  ??? fall reduction program maintained  ??? family at bedside  ??? low bed  Taken 08/06/2020 2100 by Regino Schultze, RN  Safety Interventions:  ??? fall reduction program maintained  ??? bed alarm  ??? commode/urinal/bedpan at bedside  ??? lighting adjusted for tasks/safety  ??? low bed  ??? nonskid shoes/slippers when out of bed  Taken 08/06/2020 1900 by Regino Schultze, RN  Safety Interventions:  ??? bed alarm  ??? fall reduction program maintained  ??? family at bedside  ??? lighting adjusted for tasks/safety  ??? low bed  ??? nonskid shoes/slippers when out of bed

## 2020-08-07 NOTE — Unmapped (Signed)
GYN ONC PROGRESS NOTE    Assessment and Plan:  Lisa Burton is a 75 y.o. 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     Patient is now DNR/DNI and will pursue comfort measures only with plan for home hospice, s/p GOC discussion 4/24.     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 Worsening L pelvic sidewall lymphadenopathy and hepatic metastases and mesenteric implants consistent with disease progression. Unchanged L moderate hydroureteronephrosis 2/2 extrinsic compression of distal L ureter from pelvic mass. Mild circumferential bladder wall thickening which can be seen with cystitis.   - Last Ca-125 (07/24/20): 176 (up from 11 on 3/25)   - Patient reports understanding of her disease progression. Plans to move forward with home hospice.  ??  Nausea/vomiting + FTT   - Home meds: reglan 10mg  TID + zofran 4mg  q8hr > reportedly not taking either at home with acutely worsening symptoms.   - Omeprazole 20mg  daily    - Remeron 7.5mg  nightly  - Albumin worsened to 2.6 on admission     Cancer-related pain   - Tylenol 650mg  every 6 hours, Oxycodone 5-10mg  every 4 hours; dilaudid IV 0.5mg  PRN severe pain     Lactate elevation    - Suspect due to volume status vs. Disease burden, low concern for infection given no fevers/chills, normal WBC   - Downtrending: 3 > 3 > 2.5 > repeat 2.6 > s/p bolus. Labs discontinued due to comfort care only  ??  BNP elevation - QTc prolongation  - Clinically euvolemic though does have 2+ edema bilaterally (stable from prior), symmetric in lower extremities; cardiopulmonary examination benign. Low concern for acute cardiopulmonary process given lack of symptoms.  - BNP 181; Trop trend 15 > 18 > 20 (all wnl)   - EKG with concern for QTC prolongation, T wave inversions though low concern for ACS given normal vitals, no SOB/CP/palpitations; telemetry discontinued  ??  Pruritis   - Attributed to chemotherapy; no new rash, s/s anaphylaxis, denies any new exposures   - Atarax 25mg  PRN itching   ??  ADHD/MDD/Borderline   - Abilify 10mg  daily   - Effexor 300mg  daily ??  - Will restart these medications given current GOC  ??  Osteoporosis/chronic fracture    - Reports not taking fosamax weekly     PPX:  - Bowel regimen and antiemetics prn  - SCDs, ambulation    Dispo: Floor pending home hospice    Plan discussed with Dr. Romeo Apple and Attending Dr. Ruthe Mannan.    Subjective:   Patient is fast asleep this morning. Did not awaken for exam or interview.     Objective:  Temp:  [35.9 ??C (96.7 ??F)-36.5 ??C (97.7 ??F)] 35.9 ??C (96.7 ??F)  Heart Rate:  [77-96] 82  Resp:  [12-25] 22  BP: (119-162)/(65-91) 162/78  SpO2:  [96 %-98 %] 98 %    Gen: NAD  HEENT: Swollen/bruised upper right lip  CV: Regular rate  Pulm: Normal work of breathing    Medications (scheduled)   ??? acetaminophen  1,000 mg Oral Q8H   ??? ARIPiprazole  10 mg Oral Daily   ??? atorvastatin  10 mg Oral Daily   ???  mirtazapine  7.5 mg Oral Nightly   ??? oxybutynin  5 mg Oral TID   ??? pantoprazole  20 mg Oral Daily   ??? venlafaxine  300 mg Oral QAM       Medications (prn)  haloperidol, haloperidoL, haloperidol lactate, HYDROmorphone, LORazepam **OR** LORazepam, nitroglycerin, oxyCODONE **OR** oxyCODONE, prochlorperazine    Labs:   Lab Results   Component Value Date    WBC 8.5 08/06/2020    HGB 9.6 (L) 08/06/2020    HCT 27.5 (L) 08/06/2020    PLT 78 (L) 08/06/2020       Lab Results   Component Value Date    NA 132 (L) 08/06/2020    K 3.2 (L) 08/06/2020    CL 100 08/06/2020    CO2 21.0 08/06/2020    BUN 29 (H) 08/06/2020    CREATININE 1.76 (H) 08/06/2020    GLU 120 08/06/2020    CALCIUM 7.9 (L) 08/06/2020    MG 1.4 (L) 08/06/2020    PHOS 3.1 08/06/2020       Lab Results   Component Value Date    BILITOT 1.1 08/06/2020    BILIDIR <0.10 04/03/2020    PROT 5.4 (L) 08/06/2020    ALBUMIN 2.5 (L) 08/06/2020    ALT 25 08/06/2020    AST 42 (H) 08/06/2020    ALKPHOS 221 (H) 08/06/2020    GGT 15 04/27/2010       Lab Results   Component Value Date    INR 1.0 01/16/2011    APTT 30.2 01/16/2011     Scribe's Attestation: Donaciano Eva, MD obtained and performed the history, physical exam and medical decision making elements that were entered into the chart. Documentation assistance was provided by me personally, a scribe. Signed by Lorenda Cahill, Scribe, on August 07, 2020 at 5:32 AM.         Resident Attestation: I saw this patient and performed the physical exam and history-taking with assistance in documentation from the above-mentioned scribe. Hedy Camara, MD

## 2020-08-07 NOTE — Unmapped (Signed)
PHYSICAL THERAPY  Evaluation (08/07/20 1310)      Patient Name:?? Lisa Burton????????   Medical Record Number: [id number]   Date of Birth: 08/28/1945  Sex: Female??????????????      Treatment Diagnosis: general deconditioning, impaired mobility     Activity Tolerance: Tolerated treatment well     ASSESSMENT  Problem List: Decreased strength, Decreased mobility, Decreased endurance, Impaired balance, Fall Risk, Impaired ADLs, Gait deviation      Assessment : ???Lisa Burton is a 75 y.o. 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 Patient motivated/receptive to therapy. Presents to PT with impairments in general deconditioning, weakness, impaired mobility; impacting functional mobility and independence. She is on comfort care measures per chart review, requires PT consult for SNF level referral 2/2 unsafe discharge home w/hospice. She acknowledges that she may progressively decline as her medical condition progresses, however she would like to work with PT to minimize impact to her QOL and allow her to walk, which is her primary goal. Appropriate for 5x low post acute discharge at this time. Based on evaluation findings associated with the patient's personal history factors, examination of body systems, and clinical presentation, the patient classifies as a low complexity level evaluation.      Today's Interventions: PT EVAL. Education role of PT, POC, benefits of mobilization, up with assistance, progressive mobility.                            PLAN  Planned Frequency of Treatment:?? 1-2x per day for: 2-3x week       Planned Interventions: Balance activities, Education - Patient, Education - Family / caregiver, Endurance activities, Functional mobility, Investment banker, operational, Self-care / Home training, Therapeutic exercise, Therapeutic activity, Transfer training, Diaphragmatic / Pursed-lip breathing, Home exercise program, Neuromuscular re-education Post-Discharge Physical Therapy Recommendations:?? 5x weekly, Low intensity     PT DME Recommendations: Defer to post acute??????????       Goals:   Patient and Family Goals: I would like to be able to keep walking     Long Term Goal #1: patient will be able to ambulate >369ft with supervision, LRAD in 3 weeks        SHORT GOAL #1: patient will be able to perform OOB transfers with supervision, LRAD  ?????????????????????? Time Frame : 1 week  SHORT GOAL #2: patient will be able to ambulate >134ft with supervision, LRAD  ?????????????????????? Time Frame : 1 week     ??????????????????????       ??????????????????????       ??????????????????????       Prognosis:?? Fair  Positive Indicators: motivated  Barriers to Discharge: Endurance deficits, Functional strength deficits, Decreased caregiver support, Inability to safely perform ADLS, Impaired Balance     SUBJECTIVE     Patient reports: patient agreeable to PT  Current Functional Status: end of session patient supine in bed, elevated HOB. needs in reach. bed alarm on. RN updated.     Prior Functional Status: patient reports she was starting to struggle at home using her cane for walking, felt she needed to start using rolling walker and was looking into obtaining a rollator  Equipment available at home: Quad cane, Agricultural consultant      Past Medical History:   Diagnosis Date   ??? ADHD (attention deficit hyperactivity disorder)    ??? Anxiety    ??? At risk for falls    ???  Baker's cyst    ??? Fractures    ??? Hospice care 08/06/2020   ??? Hypersomnia    ??? Hypertension    ??? Injury of posterior cruciate ligament    ??? Insomnia    ??? Irritable colon 07/10/2010   ??? Joint pain    ??? Joint replaced    ??? Major depressive disorder    ??? OSA (obstructive sleep apnea)    ??? Osteoarthritis    ??? Osteoporosis    ??? Ovarian cancer (CMS-HCC) 07/03/2017   ??? Personality disorder (CMS-HCC)     Borderline Personality Disorder   ??? PLMD (periodic limb movement disorder)    ??? Tear of meniscus of knee    ??? Urge incontinence 11/12/2012   ??? Vestibular dysfunction 09/16/2013 @  Past Surgical History:   Procedure Laterality Date   ??? CESAREAN SECTION  1981   ??? FINGER FRACTURE SURGERY Right 10/2013    thumb   ??? FOOT SURGERY Bilateral 9/00, 01/14    hardware   ??? IR INSERT PORT AGE GREATER THAN 5 YRS  07/04/2017    IR INSERT PORT AGE GREATER THAN 5 YRS 07/04/2017 [name], MD IMG [facility]IR [facility]   ??? PR BIL SALP-OOPH W/OMENTECT,TAH,RAD DISSECT Bilateral 07/01/2017    Procedure: BILATERAL SALPINGO-OOPHORECTOMY W/OMENTECTOMY, TOTAL ABD HYSTERECTOMY & RADICAL DISSECTION FOR DEBULKING;  Surgeon: Ermelinda Das, MD;  Location: MAIN OR [facility];  Service: Gynecology Oncology   ??? PR LAP,DIAGNOSTIC ABDOMEN N/A 07/01/2017    Procedure: Laparoscopy, Abdomen, Peritoneum, & Omentum, Diagnostic, W/Wo Collection Specimen(S) By Brushing Or Washing;  Surgeon: Ermelinda Das, MD;  Location: MAIN OR [facility];  Service: Gynecology Oncology   ??? 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 TONSILLECTOMY  1970   ??? WRIST FRACTURE SURGERY Right 1998    wrist          @     Allergies: Codeine and Sulfa (sulfonamide antibiotics)                  Objective Findings  Precautions / Restrictions  Precautions: Falls precautions  Weight Bearing Status: Non-applicable  Required Braces or Orthoses: Non-applicable     Communication Preference: Verbal          Pain Comments: no pain noted  Medical Tests / Procedures: Reviewed patient chart, medical imaging, vitals, labs, orders prior to evaluation  Equipment / Environment: Patient not wearing mask for full session     At Rest: VSS per EPIC  With Activity: NAD  Orthostatics: asymptomatic        Living Situation  Living Environment: House  Lives With: Spouse (husband can not assist)  Home Living: One level home, Stairs to enter without rails  Number of Stairs to Enter (outside): 1      Cognition  Cognition: Impaired/Limited  Cognition comment: patient alert and oriented; however admits to getting easily confused and is having trouble keeping track of appointments.        Skin Inspection: Intact where visualized     UE ROM / Strength  UE ROM/Strength: Left WFL, Right WFL  LE ROM / Strength  LE ROM/Strength: Right Impaired/Limited, Left Impaired/Limited  RLE Impairment: Reduced strength  LLE Impairment: Reduced strength  LE comment: antigravity bilaterally; deconditioning w/easy fatigue during standing     Motor/ Sensory/ Neuro  Sensation: WFL  Sensation comment: grossly intact throughout per patient report  Balance: Impaired  Balance comment: static + dynamic standing w/contact guard assist, rolling walker.  Posture: [facility]  Bed Mobility: supine>sit with min assist, elevated HOB. sit>supine w/stand by assist.     Transfers  Transfers: Sit to Stand  Sit to Stand comments: sit>stand from low bed height with min assist, rolling walker. stand>sit with contact guard assist, decreased eccentric control.      Gait  Level of Assistance: Contact guard assist, steadying assist  Assistive Device: Front wheel walker  Gait: static marching x15 seconds + lateral side steps (66ft) at edge of bed. decreased foot clearance bilaterally. quickly fatigues.     Stairs: not tested            Endurance: fair     Physical Therapy Session Duration  PT Individual [mins]: 16     Medical Staff Made Aware: RN Colin Mulders     I attest that I have reviewed the above information.  Signed: Patrica Duel, PT  [facility] 08/07/2020

## 2020-08-07 NOTE — Unmapped (Signed)
Palliative Care Consult Note    Consultation from Requesting Attending Physician:  Dossie Der,*  Service Requesting Consult:  Gynecology (GYN)  Reason for Consult Request from Attending Physician:  Evaluation of Symptoms and Goals of Care / Decision Making  Primary Care Provider:  Othelia Pulling, MD      Assessment/Plan:      SUMMARY:  This 75 y.o. patient is seriously ill due to carcinoma of the fallopian tube, complicated by co-morbid acute and chronic conditions including CKD, major depressive disorder.    Symptom Assessment and Recommendations:      # Cancer-related pain  Pain currently under reasonable control  -Continue scheduled acetaminophen 1000 mg 3 times daily  -Continue as needed oxycodone 5 mg every 4 hours as needed    # Nausea  Under reasonable control with as needed oral Compazine    # Major Depressive Disorder  Patient's exact psychiatric comorbidities unknown.  It seems reasonable to continue her home regimen of mirtazapine, aripiprazole, and venlafaxine.  It would also be reasonable to wean any medications (specifically the Venlafaxine) which could lead to discontinuation symptoms, especially if she were to reach a point in which she was unable to tolerate oral medication.  This taper can be done as an outpatient at the discretion of her hospice team.      Prognosis and Understanding:     Prognosis: Perhaps months per the gynecology oncology team    Family prognostic understanding: Not addressed    Goals of Care and Decision Making:     Decisional capacity at time of visit: She seems to demonstrate decision-making capacity    Decision Maker if lacks capacity: Name:     HCDM (patient stated preference): Lisa Burton - Spouse - [phone]    Advance Directive: no    Code status:   Code Status: DNR and DNI     Goals of care: Recently transitioned to comfort focused care.  Exploring disposition options with hospice, potentially skilled nursing facility given her limited physical support in the home setting      Practical, Emotional, Spiritual Support / Other Communication and Counseling:      Palliative care visit today included focused interview, active listening, therapeutic use of silence, offering support, sharing empathy and summarization of today's discussion        Thank you for this consult. We will sign off at this time as goals of care seem to be established and symptoms are under reasonable control.  Please page Angela Cox or Palliative Care [phone]) if there are any questions.        Subjective:     HPI:  [name] is a 75 year old woman with carcinoma of the fallopian tube.  Palliative care has been consulted to assist with goals of care discussions and symptom management.    See ACP note from today for details regarding goals of care discussion    Symptom Severity and Assessment:     Pain severity and assessment: Pain under reasonable control today while taking as needed oral oxycodone  Shortness of Breath: Dyspneic with exertion  Nausea/Vomiting: Under reasonable control with as needed oral Compazine  Constipation: Denies  Sleep:  Anxiety:  Depression:  Delirium, Hypoactive:   Delirium, Hyperactive:  Other:       Allergies:  Allergies   Allergen Reactions   ??? Codeine Rash   ??? Sulfa (Sulfonamide Antibiotics) Rash and Nausea Only       Medications:  Scheduled Meds:  ??? acetaminophen  1,000  mg Oral Q8H   ??? ARIPiprazole  10 mg Oral Daily   ??? mirtazapine  7.5 mg Oral Nightly   ??? oxybutynin  5 mg Oral TID   ??? pantoprazole  20 mg Oral Daily   ??? venlafaxine  300 mg Oral QAM     Continuous Infusions:  PRN Meds:.haloperidoL, LORazepam **OR** LORazepam, oxyCODONE **OR** oxyCODONE, prochlorperazine     Past Medical History:   Diagnosis Date   ??? ADHD (attention deficit hyperactivity disorder)    ??? Anxiety    ??? At risk for falls    ??? Baker's cyst    ??? Fractures    ??? Hospice care 08/06/2020   ??? Hypersomnia    ??? Hypertension    ??? Injury of posterior cruciate ligament    ??? Insomnia    ??? Irritable colon 07/10/2010   ??? Joint pain    ??? Joint replaced    ??? Major depressive disorder    ??? OSA (obstructive sleep apnea)    ??? Osteoarthritis    ??? Osteoporosis    ??? Ovarian cancer (CMS-HCC) 07/03/2017   ??? Personality disorder (CMS-HCC)     Borderline Personality Disorder   ??? PLMD (periodic limb movement disorder)    ??? Tear of meniscus of knee    ??? Urge incontinence 11/12/2012   ??? Vestibular dysfunction 09/16/2013       Past Surgical History:   Procedure Laterality Date   ??? CESAREAN SECTION  1981   ??? FINGER FRACTURE SURGERY Right 10/2013    thumb   ??? FOOT SURGERY Bilateral 9/00, 01/14    hardware   ??? IR INSERT PORT AGE GREATER THAN 5 YRS  07/04/2017    IR INSERT PORT AGE GREATER THAN 5 YRS 07/04/2017 [name], MD IMG VIR [facility]   ??? PR BIL SALP-OOPH W/OMENTECT,TAH,RAD DISSECT Bilateral 07/01/2017    Procedure: BILATERAL SALPINGO-OOPHORECTOMY W/OMENTECTOMY, TOTAL ABD HYSTERECTOMY & RADICAL DISSECTION FOR DEBULKING;  Surgeon: Ermelinda Das, MD;  Location: MAIN OR [facility];  Service: Gynecology Oncology   ??? PR LAP,DIAGNOSTIC ABDOMEN N/A 07/01/2017    Procedure: Laparoscopy, Abdomen, Peritoneum, & Omentum, Diagnostic, W/Wo Collection Specimen(S) By Brushing Or Washing;  Surgeon: Ermelinda Das, MD;  Location: MAIN OR [facility];  Service: Gynecology Oncology   ??? 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 TONSILLECTOMY  1970   ??? WRIST FRACTURE SURGERY Right 1998    wrist       Social History:   Lives at home with her husband Lisa Burton who is almost 54 and limited in his physical mobility.  Her previous husband is deceased.  She has 1 son who lives in [place] and 2 grandchildren, ages 38 and 55.  She has Jewish faith which has strengthened as she has neared the end of her life.    Family History:    family history includes Arthritis in her father, mother, and sister; Cancer in her father; Depression in her son; Personality disorder in her sister.    Review of Systems:  A 12 system review of systems was negative except as noted in HPI.      Objective:       Function:  40% - Ambulation: Mainly bed / Unable to do any work, extensive disease / Self-Care:M [employer] / Intake: Normal or reduced / Level of Conscious: Full , drowsy, or confusion    Temp:  [36.4 ??C (97.5 ??F)-36.8 ??C (98.3 ??F)] 36.4 ??C (97.5 ??F)  Heart Rate:  [74-77] 77  Resp:  [18-20] 18  BP: (146-155)/(80-81) 155/81  SpO2:  [99 %-100 %] 100 %    No intake/output data recorded.    Physical Exam:  Constitutional: Resting in bed, talkative, no distress  Eyes: anicteric sclera, no discharge  ENMT: Moist oral mucosa  Pulm: No respiratory distress at rest  MSK: Mild sarcopenia  Skin: Warm and dry  Neuro: cognitive status-fully alert and oriented  Psych: mood and affect euthymic    Test Results:  Lab Results   Component Value Date    WBC 8.5 08/06/2020    RBC 2.54 (L) 08/06/2020    HGB 9.6 (L) 08/06/2020    HCT 27.5 (L) 08/06/2020    MCV 108.3 (H) 08/06/2020    MCH 37.8 (H) 08/06/2020    MCHC 34.9 08/06/2020    RDW 16.3 (H) 08/06/2020    PLT 78 (L) 08/06/2020    MPV 8.8 08/06/2020     Lab Results   Component Value Date    NA 132 (L) 08/06/2020    K 3.2 (L) 08/06/2020    CL 100 08/06/2020    CO2 21.0 08/06/2020    BUN 29 (H) 08/06/2020    CREATININE 1.76 (H) 08/06/2020    GFR >= 60 03/30/2012    GLU 120 08/06/2020    CALCIUM 7.9 (L) 08/06/2020    ALBUMIN 2.5 (L) 08/06/2020    PHOS 3.1 08/06/2020      Lab Results   Component Value Date    ALKPHOS 221 (H) 08/06/2020    BILITOT 1.1 08/06/2020    BILIDIR <0.10 04/03/2020    PROT 5.4 (L) 08/06/2020    ALBUMIN 2.5 (L) 08/06/2020    ALT 25 08/06/2020    AST 42 (H) 08/06/2020       Imaging: reviewed in Epic      Total time spent for evaluation & management (excluding ACP documented separately): 30 Minutes  Start time - stop time:    Greater than 50% of this time spent on counseling/coordination of care:  Yes.   See ACP Note from today for additional billable service:  Yes.     Angela Cox, MD  Hospice and Palliative Medicine Attending

## 2020-08-07 NOTE — Unmapped (Signed)
ADVANCE CARE PLANNING NOTE    Discussion Date:  August 07, 2020    Patient has decisional capacity:  Yes    Patient has selected a Health Care Decision-Maker if loses capacity: No    Health Care Decision Maker as of 08/07/2020    HCDM (patient stated preference): Ashley Murrain - Spouse - [phone]    Discussion Participants:  Patient Danae Chen  Dr Angela Cox- palliative care    Communication of Medical Status/Prognosis:   Ms. Dewitt Hoes shared her understanding that the cancer is more advanced than she initially thought.  She shared how her recent cancer treatments have caused her body significant harm and affected her quality of life.  She tells me that she does not want to go through any sort of treatments that may cause more harm than benefit.  She is unsure how much time she has left but told me that she would be surprised if she is alive in a month.    I shared what I had heard from the gynecology oncology team, which is that Ms. Dewitt Hoes does not have any remaining cancer-directed therapies that would have a favorable risk/benefit profile.  I shared that even if we do not have a way to treat her cancer, there are many things that we can offer to support her quality of life and what is most important to her in whatever time she does have remaining.    Communication of Treatment Goals/Options:   When asked what is most important to her if her time is short, she mentions her Jewish faith and her family, particularly her 56 and 50-year-old grandchildren.  She wants to be able to write them letters for their bat mitzvah's and leave them a bat [employer].  She also does not want to leave a big mess and wants to get her bills paid and files organized.     We discussed various options for her care moving forward.  We discussed hospice care, both the philosophy of hospice and logistics/locations.  She is interested in hospice care and comfort-focused care.  Given the limited support she has at home (liveing with her 51 yo husband who has physical limitations), she feels she would be best cared for in some sort of inpatient facility.    Treatment Decisions:   - Continue comfort-focused care  - Appreciate assistance of CM in looking into disposition options. She is currently not a candidate for inpatient hospice given estimated life expectancy being longer than weeks            I spent 30 minutes providing voluntary advance care planning services for this patient.    Angela Cox, MD  Hospice and Palliative Medicine Attending

## 2020-08-07 NOTE — Unmapped (Signed)
Referral order placed.

## 2020-08-08 MED ADMIN — oxybutynin (DITROPAN) tablet 5 mg: 5 mg | ORAL | @ 18:00:00

## 2020-08-08 MED ADMIN — oxybutynin (DITROPAN) tablet 5 mg: 5 mg | ORAL | @ 02:00:00

## 2020-08-08 MED ADMIN — acetaminophen (TYLENOL) tablet 1,000 mg: 1000 mg | ORAL | @ 14:00:00

## 2020-08-08 MED ADMIN — pantoprazole (PROTONIX) EC tablet 20 mg: 20 mg | ORAL | @ 14:00:00

## 2020-08-08 MED ADMIN — acetaminophen (TYLENOL) tablet 1,000 mg: 1000 mg | ORAL | @ 05:00:00

## 2020-08-08 MED ADMIN — mirtazapine (REMERON) tablet 7.5 mg: 7.5 mg | ORAL | @ 02:00:00

## 2020-08-08 MED ADMIN — ARIPiprazole (ABILIFY) tablet 10 mg: 10 mg | ORAL | @ 14:00:00

## 2020-08-08 MED ADMIN — oxybutynin (DITROPAN) tablet 5 mg: 5 mg | ORAL | @ 14:00:00

## 2020-08-08 MED ADMIN — venlafaxine (EFFEXOR-XR) 24 hr capsule 300 mg: 300 mg | ORAL | @ 11:00:00

## 2020-08-08 MED ADMIN — acetaminophen (TYLENOL) tablet 1,000 mg: 1000 mg | ORAL | @ 21:00:00

## 2020-08-08 NOTE — Unmapped (Addendum)
Lisa Burton is an 75 y.o. 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 Duard Brady. She was diagnosed with stage IIIC High Grade Carcinoma of the fallopian tube on 07/01/17 and received carboplatin/paclitaxel from 07/18/17-11/17/17. Recurrent disease was detected on 08/25/18, so she received cyclophosphamide, pembrolizumab, and bevacizumab on 06/12/20. Most recent CT abdomen-pelvis (08/05/20) showed large pelvic mass abutting vaginal cuff with new internal gas consistent with recurrent malignancy, now with fistulization to rectosigmoid colon. It also showed worsening left pelvic sidewall lymphadenopathy, hepatic metastases, and mesenteric implants consistent with disease progression. Her latest CA-125 was 176 on 4/11. After goals of care discussion on 04/24, patient decided to proceed with skilled nursing facility.     Nausea/Vomiting - Failure to Thrive, improved  Patient's albumin was low at 2.6 on admission. Her final regimen was adjusted to scheduled Pantoprazole and Remeron, and Compazine. Physical Therapy team recommended 5x weekly, low intensity post-discharge and Palliative Care saw patient during her stay.    BNP Elevation - QTc Prolongation  Patient was clinically euvolemic, but she did have 2+ edema bilaterally (stable from prior), symmetric in lower extremities. Cardiopulmonary exam was benign. Concern for acute cardiopulmonary process was low given lack of symptoms,  but she was monitored on telemetry. BNP was elevated to 181. EKG was concerning for QTC prolongation and T wave inversions.     Pruritis   Patient presented with itching likely attributed to chemotherapy. She had no new rash or signs/symptoms of anaphylaxis and denied any new exposures. She was managed on benadryl and atarax as needed.     Cancer-related Pain   Patient was managed on Tylenol, Oxycodone, and dilaudid PO/IV as needed for severe pain. See discharge meds for specific dosages.     Constipation  Patient had not had a bowel movement since admission. She was managed on a bowel regimen of glycerin suppository, miralax and senna.     Attention Deficit Hyperactivity Disorder/Major Depressive Disorder/Borderline Personality Disorder  Patient was managed on Abilify 10mg  daily and Effexor 300mg  daily.     On HD#10/17/27/22, patient was deemed stable for discharge from a Gyn Onc standpoint to [facility]. She was ambulating without difficulty, tolerating regular food/drink without nausea/vomiting, and had pain well-controlled on current regimen.

## 2020-08-08 NOTE — Unmapped (Signed)
GYN ONC PROGRESS NOTE    Assessment and Plan:  Lisa Burton is a 75 y.o. HD#5 with a history of recurrence of Stage IIIC HG carcinoma of fallopian tube admitted for nausea/vomiting/abdominal pain/FTT.     Patient is now DNR/DNI and will pursue comfort measures only, s/p GOC discussion 4/24. Patient reports understanding of her disease progression. Plans to move forward with SNF w/ hospice.      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 Worsening L pelvic sidewall lymphadenopathy and hepatic metastases and mesenteric implants consistent with disease progression. Unchanged L moderate hydroureteronephrosis 2/2 extrinsic compression of distal L ureter from pelvic mass. Mild circumferential bladder wall thickening which can be seen with cystitis.   - Last Ca-125 (07/24/20): 176 (up from 11 on 3/25)     Nausea/vomiting + FTT - improved  - PRN zofran  - pantoprazole 20mg  daily    - Remeron 7.5mg  nightly    Constipation  - Pt has not had a bm in ~1 week   - Bowel regimen    Cancer-related pain   - Tylenol 650mg  every 6 hours, Oxycodone 5-10mg  every 4 hours; dilaudid IV 0.5mg  PRN severe pain      BNP elevation - QTc prolongation  - Clinically euvolemic though does have 2+ edema bilaterally (stable from prior), symmetric in lower extremities; cardiopulmonary examination benign. Low concern for acute cardiopulmonary process given lack of symptoms.  - BNP 181; Trop trend 15 > 18 > 20 (all wnl)   - EKG with concern for QTC prolongation, T wave inversions though low concern for ACS given normal vitals, no SOB/CP/palpitations; telemetry discontinued     Pruritis   - Attributed to chemotherapy; no new rash, s/s anaphylaxis, denies any new exposures   - Atarax 25mg  PRN itching      ADHD/MDD/Borderline   - Abilify 10mg  daily   - Effexor 300mg  daily    - Restarted 4/26 given current GOC    PPX:  - Bowel regimen and antiemetics prn  - SCDs, ambulation    Dispo: Floor pending SNF w/ hospice. Will touch base with CM regarding out-of-pocket cost for SNF.  - PT: 5x weekly, low intensity, OT consult placed    Plan discussed with Dr. Romeo Apple and Attending Dr. Nelly Rout    Subjective:   Patient reports abdominal pain this morning. She is not passing flatus but she is voiding without pain. Her last bowel movement was ~1 week ago. She has not been ambulating because she needs a walker and a person to assist. Plans to try to ambulate today. Reports her swelling has improved.     Objective:  Temp:  [36.6 ??C (97.9 ??F)] 36.6 ??C (97.9 ??F)  Heart Rate:  [75] 75  Resp:  [16] 16  BP: (140)/(79) 140/79  SpO2:  [99 %] 99 %    Gen: NAD  HEENT: Swollen/bruised upper right lip  CV: Regular rate  Pulm: Normal work of breathing, lungs clear bilaterally  Abd: Tender to palpation without rebound; voluntary guarding. Bowel sounds hypoactive   Ext: no edema     Medications (scheduled)    acetaminophen  1,000 mg Oral Q8H    ARIPiprazole  10 mg Oral Daily    mirtazapine  7.5 mg Oral  Nightly    oxybutynin  5 mg Oral TID    pantoprazole  20 mg Oral Daily    venlafaxine  300 mg Oral QAM       Medications (prn)  haloperidoL, LORazepam **OR** LORazepam, oxyCODONE **OR** oxyCODONE, prochlorperazine    Labs:   Lab Results   Component Value Date    WBC 8.5 08/06/2020    HGB 9.6 (L) 08/06/2020    HCT 27.5 (L) 08/06/2020    PLT 78 (L) 08/06/2020       Lab Results   Component Value Date    NA 132 (L) 08/06/2020    K 3.2 (L) 08/06/2020    CL 100 08/06/2020    CO2 21.0 08/06/2020    BUN 29 (H) 08/06/2020    CREATININE 1.76 (H) 08/06/2020    GLU 120 08/06/2020    CALCIUM 7.9 (L) 08/06/2020    MG 1.4 (L) 08/06/2020    PHOS 3.1 08/06/2020       Lab Results   Component Value Date    BILITOT 1.1 08/06/2020    BILIDIR <0.10 04/03/2020    PROT 5.4 (L) 08/06/2020    ALBUMIN 2.5 (L) 08/06/2020    ALT 25 08/06/2020    AST 42 (H) 08/06/2020    ALKPHOS 221 (H) 08/06/2020    GGT 15 04/27/2010       Lab Results   Component Value Date    INR 1.0 01/16/2011    APTT 30.2 01/16/2011     Scribe's Attestation: Hedy Camara, MD obtained and performed the history, physical exam and medical decision making elements that were  entered into the chart. Documentation assistance was provided by me personally, a scribe. Signed by Abigail Miyamoto Scribe, on August 09, 2020 at 5:31 AM.       Resident Attestation: I saw this patient and performed the physical exam and history-taking with assistance in documentation from the above-mentioned scribe. Hedy Camara, MD    I saw and evaluated Ms. Amie Critchley , participating in the key portions of the service with the resident and fellow.  I reviewed the resident???s note and agree with the findings, assessment and plan as outlined in this note.  Awaiting hospice placement    [facility]  MD  ATTENDING  Gynecologic Oncology

## 2020-08-08 NOTE — Unmapped (Signed)
POC reviewed with patient. VSS. Diminished urine production. Bladder scanned at 1540 per MD request and found 342 mL; subsequently voided 100 mL malodorous, yellow urine. MD Chilton Si notified via face-to-face conversation. Dyspnea with exertion noted when up to [facility]. Falls precautions maintained; bed alarm active, non-skid socks in place, and walker + 2 person assist utilized when OOB. No c/o pain. No nausea. Endorses poor appetite. Will continue to monitor.    Problem: Adult Inpatient Plan of Care  Goal: Plan of Care Review  Outcome: Progressing  Goal: Patient-Specific Goal (Individualized)  Outcome: Progressing  Goal: Absence of Hospital-Acquired Illness or Injury  Outcome: Progressing  Intervention: Identify and Manage Fall Risk  Recent Flowsheet Documentation  Taken 08/07/2020 0715 by Mack Guise, RN  Safety Interventions:   low bed   lighting adjusted for tasks/safety   fall reduction program maintained  Goal: Optimal Comfort and Wellbeing  Outcome: Progressing  Goal: Readiness for Transition of Care  Outcome: Progressing  Goal: Rounds/Family Conference  Outcome: Progressing     Problem: Self-Care Deficit  Goal: Improved Ability to Complete Activities of Daily Living  Outcome: Progressing     Problem: Impaired Wound Healing  Goal: Optimal Wound Healing  Outcome: Progressing     Problem: Fall Injury Risk  Goal: Absence of Fall and Fall-Related Injury  Outcome: Progressing  Intervention: Promote Injury-Free Environment  Recent Flowsheet Documentation  Taken 08/07/2020 0715 by Mack Guise, RN  Safety Interventions:   low bed   lighting adjusted for tasks/safety   fall reduction program maintained

## 2020-08-08 NOTE — Unmapped (Signed)
POC reviewed with patient, all questions answered. VSS. Pain well controlled this shift with scheduled tylenol. Poor appetite, was able to eat half of a smoothie today per patient report. Patient voided twice on the commode overnight with a total of 225 mls. Remains free from falls and other injuries. Report given to day shift RN.     Problem: Adult Inpatient Plan of Care  Goal: Plan of Care Review  Outcome: Progressing  Goal: Patient-Specific Goal (Individualized)  Outcome: Progressing  Goal: Absence of Hospital-Acquired Illness or Injury  Outcome: Progressing  Intervention: Identify and Manage Fall Risk  Recent Flowsheet Documentation  Taken 08/07/2020 1905 by Laverda Sorenson, RN  Safety Interventions:  ??? fall reduction program maintained  ??? low bed  ??? lighting adjusted for tasks/safety  ??? nonskid shoes/slippers when out of bed  Goal: Optimal Comfort and Wellbeing  Outcome: Progressing  Goal: Readiness for Transition of Care  Outcome: Ongoing - Unchanged  Goal: Rounds/Family Conference  Outcome: Ongoing - Unchanged     Problem: Self-Care Deficit  Goal: Improved Ability to Complete Activities of Daily Living  Outcome: Progressing     Problem: Impaired Wound Healing  Goal: Optimal Wound Healing  Outcome: Ongoing - Unchanged     Problem: Fall Injury Risk  Goal: Absence of Fall and Fall-Related Injury  Intervention: Promote Injury-Free Environment  Recent Flowsheet Documentation  Taken 08/07/2020 1905 by Laverda Sorenson, RN  Safety Interventions:  ??? fall reduction program maintained  ??? low bed  ??? lighting adjusted for tasks/safety  ??? nonskid shoes/slippers when out of bed

## 2020-08-09 MED ADMIN — oxybutynin (DITROPAN) tablet 5 mg: 5 mg | ORAL | @ 01:00:00

## 2020-08-09 MED ADMIN — polyethylene glycol (MIRALAX) packet 17 g: 17 g | ORAL | @ 14:00:00

## 2020-08-09 MED ADMIN — acetaminophen (TYLENOL) tablet 1,000 mg: 1000 mg | ORAL | @ 22:00:00

## 2020-08-09 MED ADMIN — oxybutynin (DITROPAN) tablet 5 mg: 5 mg | ORAL | @ 17:00:00

## 2020-08-09 MED ADMIN — acetaminophen (TYLENOL) tablet 1,000 mg: 1000 mg | ORAL | @ 12:00:00

## 2020-08-09 MED ADMIN — oxyCODONE (ROXICODONE) immediate release tablet 5 mg: 5 mg | ORAL | @ 12:00:00 | Stop: 2020-08-20

## 2020-08-09 MED ADMIN — oxybutynin (DITROPAN) tablet 5 mg: 5 mg | ORAL | @ 12:00:00

## 2020-08-09 MED ADMIN — pantoprazole (PROTONIX) EC tablet 20 mg: 20 mg | ORAL | @ 12:00:00

## 2020-08-09 MED ADMIN — mirtazapine (REMERON) tablet 7.5 mg: 7.5 mg | ORAL | @ 01:00:00

## 2020-08-09 MED ADMIN — glycerin (adult) suppository 1 suppository: 1 | RECTAL | @ 14:00:00 | Stop: 2020-08-09

## 2020-08-09 MED ADMIN — venlafaxine (EFFEXOR-XR) 24 hr capsule 300 mg: 300 mg | ORAL | @ 11:00:00

## 2020-08-09 MED ADMIN — acetaminophen (TYLENOL) tablet 1,000 mg: 1000 mg | ORAL | @ 04:00:00

## 2020-08-09 MED ADMIN — ARIPiprazole (ABILIFY) tablet 10 mg: 10 mg | ORAL | @ 12:00:00

## 2020-08-09 NOTE — Unmapped (Signed)
Patient's vitals stable. Patient rested for most of shift. Scheduled medications were given. Patient was given glycerin suppository and mirilax for constipation but has yet to have a BM. Patient was woken up to void throughout shift with low urine output. Patient requested to bathe and was assisted to a bed bath. Patient's husband was at bedside and daughter called for an update. Arnetha Massy was given daughters information to disuse plan of care further.   Problem: Adult Inpatient Plan of Care  Goal: Plan of Care Review  Outcome: Progressing  Goal: Patient-Specific Goal (Individualized)  Outcome: Progressing  Goal: Absence of Hospital-Acquired Illness or Injury  Outcome: Progressing  Intervention: Identify and Manage Fall Risk  Recent Flowsheet Documentation  Taken 08/09/2020 0730 by Debbe Odea, RN  Safety Interventions:   low bed   lighting adjusted for tasks/safety   nonskid shoes/slippers when out of bed  Intervention: Prevent Skin Injury  Recent Flowsheet Documentation  Taken 08/09/2020 0730 by Debbe Odea, RN  Skin Protection: adhesive use limited  Goal: Optimal Comfort and Wellbeing  Outcome: Progressing  Goal: Readiness for Transition of Care  Outcome: Progressing  Goal: Rounds/Family Conference  Outcome: Progressing     Problem: Self-Care Deficit  Goal: Improved Ability to Complete Activities of Daily Living  Outcome: Progressing     Problem: Impaired Wound Healing  Goal: Optimal Wound Healing  Outcome: Progressing     Problem: Fall Injury Risk  Goal: Absence of Fall and Fall-Related Injury  Outcome: Progressing  Intervention: Promote Injury-Free Environment  Recent Flowsheet Documentation  Taken 08/09/2020 0730 by Debbe Odea, RN  Safety Interventions:   low bed   lighting adjusted for tasks/safety   nonskid shoes/slippers when out of bed     Problem: Skin Injury Risk Increased  Goal: Skin Health and Integrity  Outcome: Progressing  Intervention: Optimize Skin Protection  Recent Flowsheet Documentation  Taken 08/09/2020 0800 by Debbe Odea, RN  Pressure Reduction Devices: pressure-redistributing mattress utilized  Taken 08/09/2020 0730 by Debbe Odea, RN  Pressure Reduction Techniques: frequent weight shift encouraged  Pressure Reduction Devices: pressure-redistributing mattress utilized  Skin Protection: adhesive use limited

## 2020-08-09 NOTE — Unmapped (Signed)
Gynecology Oncology Physician Discharge Summary    Admit Date: 08/05/2020    Discharge Date: 08/11/2020     Discharge to:  SNF , [facility]    Discharge Service: Gynecology (GYN)    Discharge Attending Physician: Dossie Der, MD    Discharge Diagnoses:   Principal Problem:    Cancer (CMS-HCC)     Procedures: none    Hospital Course: Lisa Burton is an 75 y.o. 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 Duard Brady. She was diagnosed with stage IIIC High Grade Carcinoma of the fallopian tube on 07/01/17 and received carboplatin/paclitaxel from 07/18/17-11/17/17. Recurrent disease was detected on 08/25/18, so she received cyclophosphamide, pembrolizumab, and bevacizumab on 06/12/20. Most recent CT abdomen-pelvis (08/05/20) showed large pelvic mass abutting vaginal cuff with new internal gas consistent with recurrent malignancy, now with fistulization to rectosigmoid colon. It also showed worsening left pelvic sidewall lymphadenopathy, hepatic metastases, and mesenteric implants consistent with disease progression. Her latest CA-125 was 176 on 4/11. After goals of care discussion on 04/24, patient decided to proceed with skilled nursing facility.     Nausea/Vomiting - Failure to Thrive, improved  Patient's albumin was low at 2.6 on admission. Her final regimen was adjusted to scheduled Pantoprazole and Remeron, and Compazine. Physical Therapy team recommended 5x weekly, low intensity post-discharge and Palliative Care saw patient during her stay.    BNP Elevation - QTc Prolongation  Patient was clinically euvolemic, but she did have 2+ edema bilaterally (stable from prior), symmetric in lower extremities. Cardiopulmonary exam was benign. Concern for acute cardiopulmonary process was low given lack of symptoms,  but she was monitored on telemetry. BNP was elevated to 181. EKG was concerning for QTC prolongation and T wave inversions.     Pruritis   Patient presented with itching likely attributed to chemotherapy. She had no new rash or signs/symptoms of anaphylaxis and denied any new exposures. She was managed on benadryl and atarax as needed.     Cancer-related Pain   Patient was managed on Tylenol, Oxycodone, and dilaudid PO/IV as needed for severe pain. See discharge meds for specific dosages.     Constipation  Patient had not had a bowel movement since admission. She was managed on a bowel regimen of glycerin suppository, miralax and senna.     Attention Deficit Hyperactivity Disorder/Major Depressive Disorder/Borderline Personality Disorder  Patient was managed on Abilify 10mg  daily and Effexor 300mg  daily.     On HD#10/17/27/22, patient was deemed stable for discharge from a Gyn Onc standpoint to [facility]. She was ambulating without difficulty, tolerating regular food/drink without nausea/vomiting, and had pain well-controlled on current regimen.    Condition at Discharge: stable    Discharge Day Services:  The patient was seen by the primary team, and deemed ready for discharge.    Temp:  [36.8 ??C] 36.8 ??C  Heart Rate:  [86-90] 86  Resp:  [16-18] 16  BP: (137-142)/(79-90) 137/90  MAP (mmHg):  [97-104] 104  SpO2:  [100 %] 100 %        Discharge Medications:      Your  Medication List        STOP taking these medications      CALCIUM ORAL     megestroL 400 mg/10 mL (40 mg/mL) suspension  Commonly known as: MEGACE     multivitamin per tablet  Commonly known as: TAB-A-VITE/THERAGRAN     vitamin C 1000 MG tablet  Generic drug: ascorbic acid (vitamin C)     VITAMIN D3 25 mcg (1,000 unit) capsule  Generic drug: cholecalciferol (vitamin D3-25 mcg (1,000 unit))            CHANGE how you take these medications      oxyCODONE 5 MG immediate release tablet  Commonly known as: ROXICODONE  Take 1 tablet (5 mg total) by mouth every four (4) hours as needed (moderate pain) for up to 5 days.  What changed: reasons to take this     oxyCODONE 10 mg immediate release tablet  Commonly known as: ROXICODONE  Take 1 tablet (10 mg total) by mouth every four (4) hours as needed (severe pain) for up to 5 days.  What changed: You were already taking a medication with the same name, and this prescription was added. Make sure you understand how and when to take each.            CONTINUE taking these medications      acetaminophen 325 MG tablet  Commonly known as: TYLENOL  Take 2 tablets (650 mg total) by mouth every four (4) hours as needed.     alendronate 70 MG tablet  Commonly known as: FOSAMAX  Take 70 mg by mouth every seven (7) days.     ARIPiprazole 10 MG tablet  Commonly known as: ABILIFY  Take 10 mg by mouth daily.     atorvastatin 10 MG tablet  Commonly known as: LIPITOR  Take 1 tablet (10 mg total) by mouth nightly.     latanoprost 0.005 % ophthalmic solution  Commonly known as: XALATAN  Administer 1 drop to both eyes nightly.     metoclopramide 10 MG tablet  Commonly known as: REGLAN  Take 1 tablet (10 mg total) by mouth Three (3) times a day as needed.     mirtazapine 7.5 MG tablet  Commonly known as: REMERON  Take 1 tablet (7.5 mg total) by mouth nightly.     omeprazole 20 MG capsule  Commonly known as: PriLOSEC  Take 1 capsule (20 mg total) by mouth daily.     ondansetron 4 MG disintegrating tablet  Commonly known as: ZOFRAN-ODT  Take 1 tablet (4 mg total) by mouth every eight (8) hours as needed for up to 7 days.     oxybutynin 5 MG tablet  Commonly known as: DITROPAN  Take 1 tablet (5 mg total) by mouth Three (3) times a day.     senna 8.6 mg tablet  Commonly known as: SENOKOT  Take 2 tablets by mouth nightly as needed for constipation.     venlafaxine 150 MG 24 hr capsule  Commonly known as: EFFEXOR-XR  Take 2 capsules (300 mg total) by mouth every morning.              Pending Test Results:   none    Discharge Instructions:        Appointments which have been scheduled for you      Nov 09, 2020 11:40 AM  (Arrive by 11:25 AM)  OFFICE VISIT with Marca Ancona, MD  [facility] [place] [facility])  [address] [place] [address]  [phone]        Jan 10, 2021 12:40 PM  (Arrive by 12:25 PM)  RETURN  ENDOCRINE with Leandra Kern, MD  [facility] [place] [facility]) [address]  [place] [address]  [phone]               Scribe's Attestation: Hedy Camara, MD obtained and performed the history, physical exam and medical decision making elements that were entered into the chart. Documentation assistance was provided by me personally, a scribe. Signed by Katherine Roan, Scribe, on August 11, 2020 at 5:37 AM.         ----------------------------------------------------------------------------------------------------------------------  August 11, 2020 9:00 AM. Documentation assistance provided by the Scribe. I was present during the time the encounter was recorded. The information recorded by the Scribe was done at my direction and has been reviewed and validated by me.  ----------------------------------------------------------------------------------------------------------------------    I saw Lisa Burton  on the day of discharge participating in the key portions of the service on the day of discharge.  I reviewed the resident's note and agree with the discharge plans and disposition.  I personally spent less than 30 minutes in the discharge planning services.    Doreatha Lew, MD  Attending  Gyn Oncology

## 2020-08-09 NOTE — Unmapped (Signed)
Pt rested well throughout shift. Pt states pain well controlled with scheduled medications. Reg diet well tolerated with low appetite. Up w/ assist x 2 to use the bedside commode. Low UOP for shift and concentrated. Pt encouraged to drink fluids and use the restroom. Q2h turns offered by refused by pt. Husband at bedside.    Problem: Adult Inpatient Plan of Care  Goal: Plan of Care Review  Outcome: Progressing  Goal: Patient-Specific Goal (Individualized)  Outcome: Progressing  Goal: Absence of Hospital-Acquired Illness or Injury  Outcome: Progressing  Intervention: Identify and Manage Fall Risk  Recent Flowsheet Documentation  Taken 08/08/2020 1900 by Steele Berg, RN  Safety Interventions:   assistive device   bed alarm   fall reduction program maintained   lighting adjusted for tasks/safety   low bed   nonskid shoes/slippers when out of bed  Intervention: Prevent Skin Injury  Recent Flowsheet Documentation  Taken 08/08/2020 1900 by Steele Berg, RN  Skin Protection:   adhesive use limited   incontinence pads utilized  Intervention: Prevent and Manage VTE (Venous Thromboembolism) Risk  Recent Flowsheet Documentation  Taken 08/08/2020 1900 by Steele Berg, RN  Activity Management: activity adjusted per tolerance  Goal: Optimal Comfort and Wellbeing  Outcome: Progressing  Goal: Readiness for Transition of Care  Outcome: Progressing  Goal: Rounds/Family Conference  Outcome: Progressing     Problem: Self-Care Deficit  Goal: Improved Ability to Complete Activities of Daily Living  Outcome: Progressing     Problem: Impaired Wound Healing  Goal: Optimal Wound Healing  Outcome: Progressing  Intervention: Promote Wound Healing  Recent Flowsheet Documentation  Taken 08/08/2020 1900 by Steele Berg, RN  Activity Management: activity adjusted per tolerance     Problem: Fall Injury Risk  Goal: Absence of Fall and Fall-Related Injury  Outcome: Progressing  Intervention: Promote Injury-Free Environment  Recent Flowsheet Documentation  Taken 08/08/2020 1900 by Steele Berg, RN  Safety Interventions:   assistive device   bed alarm   fall reduction program maintained   lighting adjusted for tasks/safety   low bed   nonskid shoes/slippers when out of bed     Problem: Skin Injury Risk Increased  Goal: Skin Health and Integrity  Outcome: Progressing  Intervention: Optimize Skin Protection  Recent Flowsheet Documentation  Taken 08/08/2020 1900 by Steele Berg, RN  Pressure Reduction Techniques: weight shift assistance provided  Pressure Reduction Devices: pressure-redistributing mattress utilized  Skin Protection:   adhesive use limited   incontinence pads utilized

## 2020-08-09 NOTE — Unmapped (Signed)
VSS. Good pain management with scheduled pain meds. Continues on comfort care.Poor po intake and diminished UOP, She is encouraged to drink fluids, she is offered and encouraged approximately Q2H to used commode, she refused. Safety measures in place. Will continue to monitor.   Problem: Adult Inpatient Plan of Care  Goal: Plan of Care Review  Outcome: Progressing  Goal: Patient-Specific Goal (Individualized)  Outcome: Progressing  Goal: Absence of Hospital-Acquired Illness or Injury  Outcome: Progressing  Intervention: Identify and Manage Fall Risk  Recent Flowsheet Documentation  Taken 08/08/2020 1700 by Hoyt Koch, RN  Safety Interventions:   assistive device   bed alarm   commode/urinal/bedpan at bedside   fall reduction program maintained   lighting adjusted for tasks/safety   low bed   nonskid shoes/slippers when out of bed  Taken 08/08/2020 1500 by Hoyt Koch, RN  Safety Interventions:   aspiration precautions   bed alarm   commode/urinal/bedpan at bedside   fall reduction program maintained   lighting adjusted for tasks/safety   low bed   nonskid shoes/slippers when out of bed  Taken 08/08/2020 1300 by Hoyt Koch, RN  Safety Interventions:   assistive device   bed alarm   commode/urinal/bedpan at bedside   fall reduction program maintained   lighting adjusted for tasks/safety   low bed   nonskid shoes/slippers when out of bed  Taken 08/08/2020 1100 by Hoyt Koch, RN  Safety Interventions:   assistive device   fall reduction program maintained   lighting adjusted for tasks/safety   low bed   nonskid shoes/slippers when out of bed  Taken 08/08/2020 0900 by Hoyt Koch, RN  Safety Interventions:   assistive device   commode/urinal/bedpan at bedside   fall reduction program maintained   lighting adjusted for tasks/safety   low bed   nonskid shoes/slippers when out of bed  Taken 08/08/2020 0710 by Hoyt Koch, RN  Safety Interventions:   assistive device   fall reduction program maintained   lighting adjusted for tasks/safety   low bed   nonskid shoes/slippers when out of bed   commode/urinal/bedpan at bedside  Goal: Optimal Comfort and Wellbeing  Outcome: Progressing  Goal: Readiness for Transition of Care  Outcome: Progressing  Goal: Rounds/Family Conference  Outcome: Progressing     Problem: Self-Care Deficit  Goal: Improved Ability to Complete Activities of Daily Living  Outcome: Progressing     Problem: Impaired Wound Healing  Goal: Optimal Wound Healing  Outcome: Progressing     Problem: Fall Injury Risk  Goal: Absence of Fall and Fall-Related Injury  Outcome: Progressing  Intervention: Promote Injury-Free Environment  Recent Flowsheet Documentation  Taken 08/08/2020 1700 by Hoyt Koch, RN  Safety Interventions:   assistive device   bed alarm   commode/urinal/bedpan at bedside   fall reduction program maintained   lighting adjusted for tasks/safety   low bed   nonskid shoes/slippers when out of bed  Taken 08/08/2020 1500 by Hoyt Koch, RN  Safety Interventions:   aspiration precautions   bed alarm   commode/urinal/bedpan at bedside   fall reduction program maintained   lighting adjusted for tasks/safety   low bed   nonskid shoes/slippers when out of bed  Taken 08/08/2020 1300 by Hoyt Koch, RN  Safety Interventions:   assistive device   bed alarm   commode/urinal/bedpan at bedside   fall reduction program maintained   lighting adjusted for tasks/safety   low bed   nonskid shoes/slippers when out of bed  Taken 08/08/2020 1100 by  Hoyt Koch, RN  Safety Interventions:   assistive device   fall reduction program maintained   lighting adjusted for tasks/safety   low bed   nonskid shoes/slippers when out of bed  Taken 08/08/2020 0900 by Hoyt Koch, RN  Safety Interventions:   assistive device   commode/urinal/bedpan at bedside   fall reduction program maintained   lighting adjusted for tasks/safety   low bed   nonskid shoes/slippers when out of bed  Taken 08/08/2020 0710 by Hoyt Koch, RN  Safety Interventions:   assistive device   fall reduction program maintained   lighting adjusted for tasks/safety   low bed   nonskid shoes/slippers when out of bed   commode/urinal/bedpan at bedside     Problem: Skin Injury Risk Increased  Goal: Skin Health and Integrity  Outcome: Progressing  Intervention: Optimize Skin Protection  Recent Flowsheet Documentation  Taken 08/08/2020 1300 by Hoyt Koch, RN  Pressure Reduction Techniques: weight shift assistance provided  Taken 08/08/2020 1100 by Hoyt Koch, RN  Pressure Reduction Techniques: frequent weight shift encouraged  Taken 08/08/2020 0900 by Hoyt Koch, RN  Pressure Reduction Techniques: frequent weight shift encouraged  Pressure Reduction Devices: pressure-redistributing mattress utilized  Taken 08/08/2020 0710 by Hoyt Koch, RN  Pressure Reduction Techniques: frequent weight shift encouraged  Pressure Reduction Devices: pressure-redistributing mattress utilized

## 2020-08-10 MED ADMIN — menthol (COUGH DROPS) lozenge 1 lozenge: 1 | ORAL | @ 20:00:00

## 2020-08-10 MED ADMIN — oxybutynin (DITROPAN) tablet 5 mg: 5 mg | ORAL | @ 13:00:00

## 2020-08-10 MED ADMIN — oxybutynin (DITROPAN) tablet 5 mg: 5 mg | ORAL | @ 18:00:00

## 2020-08-10 MED ADMIN — pantoprazole (PROTONIX) EC tablet 20 mg: 20 mg | ORAL | @ 13:00:00

## 2020-08-10 MED ADMIN — acetaminophen (TYLENOL) tablet 1,000 mg: 1000 mg | ORAL | @ 04:00:00

## 2020-08-10 MED ADMIN — ARIPiprazole (ABILIFY) tablet 10 mg: 10 mg | ORAL | @ 13:00:00

## 2020-08-10 MED ADMIN — venlafaxine (EFFEXOR-XR) 24 hr capsule 300 mg: 300 mg | ORAL | @ 11:00:00

## 2020-08-10 MED ADMIN — acetaminophen (TYLENOL) tablet 1,000 mg: 1000 mg | ORAL | @ 13:00:00

## 2020-08-10 MED ADMIN — menthol (COUGH DROPS) lozenge 1 lozenge: 1 | ORAL | @ 11:00:00

## 2020-08-10 MED ADMIN — polyethylene glycol (MIRALAX) packet 17 g: 17 g | ORAL | @ 13:00:00

## 2020-08-10 MED ADMIN — mirtazapine (REMERON) tablet 7.5 mg: 7.5 mg | ORAL

## 2020-08-10 MED ADMIN — senna (SENOKOT) tablet 1 tablet: 1 | ORAL

## 2020-08-10 MED ADMIN — oxybutynin (DITROPAN) tablet 5 mg: 5 mg | ORAL

## 2020-08-10 NOTE — Unmapped (Addendum)
VSS. Pt rested well throughout shift. Pain is well controlled with scheduled medications. Reg diet well tolerated with poor PO intake. Low UOP for shift and concentrated. Pt offered q2h turns and assistance using the restroom but refused. Swollen lower extremities. Up with assist x2 to use the bedside commode. No BM. PRN lozenges requested for sore throat. Continues on comfort and care and will monitor.    Problem: Adult Inpatient Plan of Care  Goal: Plan of Care Review  Outcome: Progressing  Goal: Patient-Specific Goal (Individualized)  Outcome: Progressing  Goal: Absence of Hospital-Acquired Illness or Injury  Outcome: Progressing  Intervention: Identify and Manage Fall Risk  Recent Flowsheet Documentation  Taken 08/09/2020 1905 by Steele Berg, RN  Safety Interventions:  ??? low bed  ??? fall reduction program maintained  ??? nonskid shoes/slippers when out of bed  Intervention: Prevent Skin Injury  Recent Flowsheet Documentation  Taken 08/09/2020 1905 by Steele Berg, RN  Skin Protection:  ??? adhesive use limited  ??? incontinence pads utilized  Intervention: Prevent and Manage VTE (Venous Thromboembolism) Risk  Recent Flowsheet Documentation  Taken 08/09/2020 1905 by Steele Berg, RN  Activity Management: activity adjusted per tolerance  Goal: Optimal Comfort and Wellbeing  Outcome: Progressing  Goal: Readiness for Transition of Care  Outcome: Progressing  Goal: Rounds/Family Conference  Outcome: Progressing     Problem: Self-Care Deficit  Goal: Improved Ability to Complete Activities of Daily Living  Outcome: Progressing     Problem: Impaired Wound Healing  Goal: Optimal Wound Healing  Outcome: Progressing  Intervention: Promote Wound Healing  Recent Flowsheet Documentation  Taken 08/09/2020 1905 by Steele Berg, RN  Activity Management: activity adjusted per tolerance     Problem: Fall Injury Risk  Goal: Absence of Fall and Fall-Related Injury  Outcome: Progressing  Intervention: Promote Injury-Free Environment  Recent Flowsheet Documentation  Taken 08/09/2020 1905 by Steele Berg, RN  Safety Interventions:  ??? low bed  ??? fall reduction program maintained  ??? nonskid shoes/slippers when out of bed     Problem: Skin Injury Risk Increased  Goal: Skin Health and Integrity  Outcome: Progressing  Intervention: Optimize Skin Protection  Recent Flowsheet Documentation  Taken 08/09/2020 1905 by Steele Berg, RN  Pressure Reduction Techniques: frequent weight shift encouraged  Pressure Reduction Devices: pressure-redistributing mattress utilized  Skin Protection:  ??? adhesive use limited  ??? incontinence pads utilized

## 2020-08-10 NOTE — Unmapped (Signed)
GYN ONC PROGRESS NOTE    Assessment and Plan:  Lisa Burton is a 75 y.o. HD#6 with a history of recurrence of Stage IIIC HG carcinoma of fallopian tube admitted for nausea/vomiting/abdominal pain/FTT.   Patient is DNR/DNI, comfort measures only, s/p GOC discussion 4/24. Plans to move forward with SNF w/ hospice; awaiting placement.     Onc (PG): See H&P for full history.     Nausea/vomiting + FTT - improved  - Pantoprazole 20mg  daily, Remeron 7.5mg  nightly, PRN Compazine  - s/p Pall Care c/s    Constipation  - Bowel regimen    Cancer-related pain   - Tylenol 650mg  every 6 hours, Oxycodone 5-10mg  every 4 hours; dilaudid IV 0.5mg  PRN severe pain       ADHD/MDD/Borderline   - Abilify 10mg  daily   - Effexor 300mg  daily      PPX:  - Bowel regimen and antiemetics prn  - SCDs, ambulation    Dispo: Floor pending SNF w/ hospice.   - PT: 5x weekly, low intensity, OT consult placed    Plan discussed with Dr. Romeo Apple and Attending Dr. Nelly Rout     Subjective:   Patient this morning had questions regarding her prognosis. Questions answered.    Objective:  Temp:  [36.6 ??C (97.8 ??F)] 36.6 ??C (97.8 ??F)  Heart Rate:  [80] 80  Resp:  [18] 18  BP: (159)/(82) 159/82  SpO2:  [97 %] 97 %    Gen: NAD  Neuro: AOx3  HEENT: Swollen/bruised upper right lip  CV: Regular rate  Pulm: Normal work of breathing, lungs clear bilaterally  Abd: Tender to palpation without rebound; Bowel sounds hypoactive   Ext: no edema     Medications (scheduled)    acetaminophen  1,000 mg Oral Q8H    ARIPiprazole  10 mg Oral Daily    mirtazapine  7.5 mg Oral Nightly    oxybutynin  5 mg Oral TID    pantoprazole  20 mg Oral Daily    polyethylene glycol  17 g Oral Daily    senna  1 tablet Oral Nightly    venlafaxine  300 mg Oral QAM       Medications (prn)  haloperidoL, LORazepam **OR** LORazepam, cough/sore throat lozenge, oxyCODONE **OR** oxyCODONE, prochlorperazine    Labs:   Lab Results   Component Value Date    WBC 8.5 08/06/2020    HGB 9.6 (L) 08/06/2020 HCT 27.5 (L) 08/06/2020    PLT 78 (L) 08/06/2020       Lab Results   Component Value Date    NA 132 (L) 08/06/2020    K 3.2 (L) 08/06/2020    CL 100 08/06/2020    CO2 21.0 08/06/2020    BUN 29 (H) 08/06/2020    CREATININE 1.76 (H) 08/06/2020    GLU 120 08/06/2020    CALCIUM 7.9 (L) 08/06/2020    MG 1.4 (L) 08/06/2020    PHOS 3.1 08/06/2020       Lab Results   Component Value Date    BILITOT 1.1 08/06/2020    BILIDIR <0.10 04/03/2020    PROT 5.4 (L) 08/06/2020    ALBUMIN 2.5 (L) 08/06/2020    ALT 25 08/06/2020    AST 42 (H) 08/06/2020    ALKPHOS 221 (H) 08/06/2020    GGT 15 04/27/2010       Lab Results   Component Value Date    INR 1.0 01/16/2011    APTT 30.2 01/16/2011     Scribe's  Attestation: Hedy Camara, MD obtained and performed the history, physical exam and medical decision making elements that were entered into the chart. Documentation assistance was provided by me personally, a scribe. Signed by Katherine Roan, Scribe, on August 10, 2020 at 6:21 AM.         Resident Attestation: I saw this patient and performed the physical exam and history-taking with assistance in documentation from the above-mentioned scribe. Hedy Camara, MD    I saw and evaluated [name] , participating in the key portions of the service with the resident and fellow.  I reviewed the resident???s note and agree with the findings, assessment and plan as outlined in this note.  Hospice care  Discharge planning    Doreatha Lew  MD  ATTENDING  Gynecologic Oncology

## 2020-08-11 MED ORDER — OXYCODONE 10 MG TABLET
ORAL_TABLET | ORAL | 0 refills | 5.00000 days | Status: CP | PRN
Start: 2020-08-11 — End: 2020-08-16

## 2020-08-11 MED ORDER — ONDANSETRON 4 MG DISINTEGRATING TABLET
ORAL_TABLET | Freq: Three times a day (TID) | ORAL | 2 refills | 10.00000 days | Status: CP | PRN
Start: 2020-08-11 — End: 2020-08-18

## 2020-08-11 MED ORDER — MIRTAZAPINE 7.5 MG TABLET
ORAL_TABLET | Freq: Every evening | ORAL | 3 refills | 90 days | Status: CP
Start: 2020-08-11 — End: 2020-09-10

## 2020-08-11 MED ORDER — OXYCODONE 5 MG TABLET
ORAL_TABLET | ORAL | 0 refills | 5.00000 days | Status: CP | PRN
Start: 2020-08-11 — End: 2020-08-16

## 2020-08-11 MED ORDER — METOCLOPRAMIDE 10 MG TABLET
ORAL_TABLET | Freq: Three times a day (TID) | ORAL | 1 refills | 30 days | PRN
Start: 2020-08-11 — End: 2020-09-10

## 2020-08-11 MED ORDER — SENNOSIDES 8.6 MG TABLET
ORAL_TABLET | Freq: Every evening | ORAL | 2 refills | 60 days | PRN
Start: 2020-08-11 — End: 2020-09-10

## 2020-08-11 MED ADMIN — senna (SENOKOT) tablet 1 tablet: 1 | ORAL | @ 02:00:00

## 2020-08-11 MED ADMIN — pantoprazole (PROTONIX) EC tablet 20 mg: 20 mg | ORAL | @ 13:00:00 | Stop: 2020-08-11

## 2020-08-11 MED ADMIN — oxybutynin (DITROPAN) tablet 5 mg: 5 mg | ORAL | @ 13:00:00 | Stop: 2020-08-11

## 2020-08-11 MED ADMIN — oxybutynin (DITROPAN) tablet 5 mg: 5 mg | ORAL | @ 02:00:00

## 2020-08-11 MED ADMIN — polyethylene glycol (MIRALAX) packet 17 g: 17 g | ORAL | @ 13:00:00 | Stop: 2020-08-11

## 2020-08-11 MED ADMIN — mirtazapine (REMERON) tablet 7.5 mg: 7.5 mg | ORAL | @ 02:00:00

## 2020-08-11 MED ADMIN — acetaminophen (TYLENOL) tablet 1,000 mg: 1000 mg | ORAL | @ 04:00:00 | Stop: 2020-08-11

## 2020-08-11 MED ADMIN — heparin, porcine (PF) 100 unit/mL injection 500 Units: 500 [IU] | INTRAVENOUS | @ 17:00:00 | Stop: 2020-08-11

## 2020-08-11 MED ADMIN — venlafaxine (EFFEXOR-XR) 24 hr capsule 300 mg: 300 mg | ORAL | @ 11:00:00 | Stop: 2020-08-11

## 2020-08-11 MED ADMIN — acetaminophen (TYLENOL) tablet 1,000 mg: 1000 mg | ORAL | @ 13:00:00 | Stop: 2020-08-11

## 2020-08-11 MED ADMIN — ARIPiprazole (ABILIFY) tablet 10 mg: 10 mg | ORAL | @ 13:00:00 | Stop: 2020-08-11

## 2020-08-11 NOTE — Unmapped (Signed)
Patient only needs 30 days or less of rehabilitation. Her MDD is currently well controlled on her daily medications.     Saintclair Halsted MD    Doreatha Lew MD., PHD  ATTENDING  Gynecologic Oncology

## 2020-08-11 NOTE — Unmapped (Signed)
VSS. Continues on comfort care. Pt rested well throughout shift. Pain is well controlled with scheduled medications. Poor PO intake. Low UOP for shift. Pt offered q2h turns and assistance using the restroom but refused. Swollen lower extremities with pitting edema. No BM. Husband at bedside. Will continue to monitor.    Problem: Adult Inpatient Plan of Care  Goal: Plan of Care Review  Outcome: Progressing  Goal: Patient-Specific Goal (Individualized)  Outcome: Progressing  Goal: Absence of Hospital-Acquired Illness or Injury  Outcome: Progressing  Intervention: Identify and Manage Fall Risk  Recent Flowsheet Documentation  Taken 08/10/2020 1905 by Steele Berg, RN  Safety Interventions:   fall reduction program maintained   lighting adjusted for tasks/safety   low bed   nonskid shoes/slippers when out of bed  Intervention: Prevent Skin Injury  Recent Flowsheet Documentation  Taken 08/10/2020 1905 by Steele Berg, RN  Skin Protection:   adhesive use limited   incontinence pads utilized  Goal: Optimal Comfort and Wellbeing  Outcome: Progressing  Goal: Readiness for Transition of Care  Outcome: Progressing  Goal: Rounds/Family Conference  Outcome: Progressing     Problem: Self-Care Deficit  Goal: Improved Ability to Complete Activities of Daily Living  Outcome: Progressing     Problem: Impaired Wound Healing  Goal: Optimal Wound Healing  Outcome: Progressing     Problem: Fall Injury Risk  Goal: Absence of Fall and Fall-Related Injury  Outcome: Progressing  Intervention: Promote Injury-Free Environment  Recent Flowsheet Documentation  Taken 08/10/2020 1905 by Steele Berg, RN  Safety Interventions:   fall reduction program maintained   lighting adjusted for tasks/safety   low bed   nonskid shoes/slippers when out of bed     Problem: Skin Injury Risk Increased  Goal: Skin Health and Integrity  Outcome: Progressing  Intervention: Optimize Skin Protection  Recent Flowsheet Documentation  Taken 08/10/2020 1905 by Steele Berg, RN  Pressure Reduction Techniques: frequent weight shift encouraged  Pressure Reduction Devices: pressure-redistributing mattress utilized  Skin Protection:   adhesive use limited   incontinence pads utilized

## 2020-08-11 NOTE — Unmapped (Signed)
VSS. Denied pain/discomfort. Diminished UOP. She continue to be on comfort care. No n/v and appetite poor. Encouraged and provided po intake. Passing flatus and BM. Remains free of falls and injuries, bed alarm in place. Will continue to monitor.   Problem: Adult Inpatient Plan of Care  Goal: Plan of Care Review  Outcome: Progressing  Goal: Patient-Specific Goal (Individualized)  Outcome: Progressing  Goal: Absence of Hospital-Acquired Illness or Injury  Outcome: Progressing  Intervention: Identify and Manage Fall Risk  Recent Flowsheet Documentation  Taken 08/10/2020 0715 by Hoyt Koch, RN  Safety Interventions:   fall reduction program maintained   lighting adjusted for tasks/safety   low bed   nonskid shoes/slippers when out of bed  Goal: Optimal Comfort and Wellbeing  Outcome: Progressing  Goal: Readiness for Transition of Care  Outcome: Progressing  Goal: Rounds/Family Conference  Outcome: Progressing     Problem: Self-Care Deficit  Goal: Improved Ability to Complete Activities of Daily Living  Outcome: Progressing     Problem: Impaired Wound Healing  Goal: Optimal Wound Healing  Outcome: Progressing     Problem: Fall Injury Risk  Goal: Absence of Fall and Fall-Related Injury  Outcome: Progressing  Intervention: Promote Injury-Free Environment  Recent Flowsheet Documentation  Taken 08/10/2020 0715 by Hoyt Koch, RN  Safety Interventions:   fall reduction program maintained   lighting adjusted for tasks/safety   low bed   nonskid shoes/slippers when out of bed     Problem: Skin Injury Risk Increased  Goal: Skin Health and Integrity  Outcome: Progressing  Intervention: Optimize Skin Protection  Recent Flowsheet Documentation  Taken 08/10/2020 0715 by Hoyt Koch, RN  Pressure Reduction Techniques: frequent weight shift encouraged  Pressure Reduction Devices: pressure-redistributing mattress utilized

## 2020-08-11 NOTE — Unmapped (Shared)
GYN ONC PROGRESS NOTE    Assessment and Plan:  Lisa Burton is a 75 y.o. 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   Patient is DNR/DNI, comfort measures only, s/p GOC discussion 4/24. Discharging 04/29 to SNF w/ hospice ([facility]).    Onc (PG): See H&P for full history.     Nausea/vomiting + FTT - improved  - Pantoprazole 20mg  daily, Remeron 7.5mg  nightly, PRN Compazine  - s/p Pall Care c/s    Constipation  - Bowel regimen    Cancer-related pain   - Tylenol 650mg  every 6 hours, Oxycodone 5-10mg  every 4 hours; dilaudid IV 0.5mg  PRN severe pain       ADHD/MDD/Borderline   - Abilify 10mg  daily   - Effexor 300mg  daily      PPX:  - Bowel regimen and antiemetics prn  - SCDs, ambulation    Dispo: Floor > discharge to SNF w/ hospice ([facility]).  - PT: 5x weekly, low intensity    Plan discussed with Dr. Romeo Apple and Attending Dr. Nelly Rout     Subjective:   ***Patient this morning had questions regarding her prognosis. Questions answered.    Objective:  Temp:  [36.6 ??C (97.8 ??F)-36.8 ??C (98.3 ??F)] 36.8 ??C (98.3 ??F)  Heart Rate:  [80-90] 90  Resp:  [18] 18  BP: (139-159)/(79-90) 142/90  SpO2:  [97 %-100 %] 100 %    Gen: NAD  Neuro: AOx3  HEENT: Swollen/bruised upper right lip  CV: Regular rate  Pulm: Normal work of breathing, lungs clear bilaterally  Abd: Tender to palpation without rebound; ***Bowel sounds hypoactive   Ext: ***no edema     Medications (scheduled)   ??? acetaminophen  1,000 mg Oral Q8H   ??? ARIPiprazole  10 mg Oral Daily   ??? mirtazapine  7.5 mg Oral Nightly   ??? oxybutynin  5 mg Oral TID   ??? pantoprazole  20 mg Oral Daily   ??? polyethylene glycol  17 g Oral Daily   ??? senna  1 tablet Oral Nightly   ??? venlafaxine  300 mg Oral QAM       Medications (prn)  haloperidoL, LORazepam **OR** LORazepam, cough/sore throat lozenge, oxyCODONE **OR** oxyCODONE, prochlorperazine    Labs:   Lab Results Component Value Date    WBC 8.5 08/06/2020    HGB 9.6 (L) 08/06/2020    HCT 27.5 (L) 08/06/2020    PLT 78 (L) 08/06/2020       Lab Results   Component Value Date    NA 132 (L) 08/06/2020    K 3.2 (L) 08/06/2020    CL 100 08/06/2020    CO2 21.0 08/06/2020    BUN 29 (H) 08/06/2020    CREATININE 1.76 (H) 08/06/2020    GLU 120 08/06/2020    CALCIUM 7.9 (L) 08/06/2020    MG 1.4 (L) 08/06/2020    PHOS 3.1 08/06/2020       Lab Results   Component Value Date    BILITOT 1.1 08/06/2020    BILIDIR <0.10 04/03/2020    PROT 5.4 (L) 08/06/2020    ALBUMIN 2.5 (L) 08/06/2020    ALT 25 08/06/2020    AST 42 (H) 08/06/2020    ALKPHOS 221 (H) 08/06/2020    GGT 15 04/27/2010       Lab Results   Component Value Date    INR 1.0 01/16/2011    APTT 30.2  01/16/2011     Scribe's Attestation: Hedy Camara, MD obtained and performed the history, physical exam and medical decision making elements that were entered into the chart. Documentation assistance was provided by me personally, a scribe. Signed by Katherine Roan, Scribe, on August 11, 2020 at 4:27 AM.         {*** NOTE TO PROVIDER: PLEASE ADD ATTESTATION NOTING YOU AGREE WITH SCRIBE DOCUMENTATION. For template- Go to smartphrases --> Moon, Y --> [id number]}

## 2020-08-11 NOTE — Unmapped (Signed)
CONTINUING CARE NETWORK  Enrollment Note        Patient admitted to [facility] and enrolled in the [facility] Care [facility]. Case Management to follow up within 1 week to complete medication reconciliation and discuss transition planning.

## 2020-08-11 NOTE — Unmapped (Signed)
VSS. She is AOX3. She was discharged to [facility]. Pt was picked up via ambulance at approximately 1415. Report given to [facility].   Problem: Adult Inpatient Plan of Care  Goal: Plan of Care Review  Outcome: Resolved  Goal: Patient-Specific Goal (Individualized)  Outcome: Resolved  Goal: Absence of Hospital-Acquired Illness or Injury  Outcome: Resolved  Intervention: Identify and Manage Fall Risk  Recent Flowsheet Documentation  Taken 08/11/2020 0710 by Hoyt Koch, RN  Safety Interventions:   fall reduction program maintained   lighting adjusted for tasks/safety   low bed   nonskid shoes/slippers when out of bed  Goal: Optimal Comfort and Wellbeing  Outcome: Resolved  Goal: Readiness for Transition of Care  Outcome: Resolved  Goal: Rounds/Family Conference  Outcome: Resolved     Problem: Self-Care Deficit  Goal: Improved Ability to Complete Activities of Daily Living  Outcome: Resolved     Problem: Impaired Wound Healing  Goal: Optimal Wound Healing  Outcome: Resolved     Problem: Fall Injury Risk  Goal: Absence of Fall and Fall-Related Injury  Outcome: Resolved  Intervention: Promote Injury-Free Environment  Recent Flowsheet Documentation  Taken 08/11/2020 0710 by Hoyt Koch, RN  Safety Interventions:   fall reduction program maintained   lighting adjusted for tasks/safety   low bed   nonskid shoes/slippers when out of bed     Problem: Skin Injury Risk Increased  Goal: Skin Health and Integrity  Outcome: Resolved  Intervention: Optimize Skin Protection  Recent Flowsheet Documentation  Taken 08/11/2020 0710 by Hoyt Koch, RN  Pressure Reduction Techniques: frequent weight shift encouraged  Pressure Reduction Devices: pressure-redistributing mattress utilized

## 2020-08-14 NOTE — Unmapped (Signed)
Mobile mail box full and could not leave a message. LM on her home answering machine.  PG

## 2020-08-15 NOTE — Unmapped (Signed)
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 After goals of care discussion on 04/24, patient decided to proceed with skilled nursing facility and was discharged to [facility] on 4/29.??    Financial/Insurance:  Patient has Medicare A and B and BCBS supplement.    Psychosocial/Coping Issues:  Patient's spouse Jesusita Oka stated that the patient is currently at a no care facility.  He said that the patient cannot get staff to care for her after calling many times for assistance.  He stated that the patient fell this morning and laid on the floor for an undetermined amount of time.  A female resident also walked into the patient's room and was delusional and belligerent'.  Jesusita Oka stated he called 911 and the police got there before staff did.  Jesusita Oka stated that he wants to take the patient home and care for her there, but does not know how to set this up.    Sw explained that he should report these incidents to the Director of nursing and the Social worker at the facility.  He stated that he spoke with staff there, but was not sure who that person was.  Dan stated at this time he wants help getting the patient home.  Sw explained that he needs to speak with the Sw at the facility as they will be the ones to help him set up a safe discharge plan.  Sw gave Jesusita Oka the number to call the facility and explained that he needs to ask to speak with the social worker there about an urgent issue.  Jesusita Oka stated he will call right now.        Sherran Needs, MSW, LCSW  Oncology Outpatient Social Worker  [phone]

## 2020-08-15 NOTE — Unmapped (Signed)
CONTINUING CARE NETWORK  SNF FOLLOW UP NOTE  Summary:  Lisa Burton is a 75 y.o. female is a patient currently at [facility] who is being followed by the [facility]. Plan is continue rehab.  Next follow up with SNF care team member in one week.        Subjective:   Interventions provided: medication reconciliation and chart review  Education provided: to notify CM of any change in patient condition or discharge plan  Barriers to care: Fall Risk  Care Coordination Note updated in [facility] Yes    Discuss at next visit: Transition planning     Objective:    Patient Active Problem List   Diagnosis   ??? Osteoporosis   ??? Hammertoe - surgery Spring 2014   ??? Major depressive disorder   ??? Anxiety   ??? Borderline personality disorder (CMS-HCC)   ??? Rib pain on left side   ??? Restless legs syndrome   ??? Sleep apnea   ??? Irritable colon   ??? Seborrheic keratosis   ??? Periodic limb movement disorder   ??? Benign essential HTN   ??? Knee joint replacement status   ??? Urge incontinence   ??? Piriformis muscle pain - intermittent   ??? Left lumbosacral radiculopathy   ??? Sacroiliac joint dysfunction of left side   ??? Vestibular dysfunction   ??? Falls frequently   ??? OSA on CPAP   ??? Left hip pain   ??? Encounter for long-term current use of medication   ??? Vitamin D deficiency   ??? Transient alteration of awareness   ??? Fallopian tube cancer, carcinoma (CMS-HCC)   ??? Encounter for antineoplastic chemotherapy   ??? Anemia associated with chemotherapy   ??? Pleuritic chest pain   ??? ADHD (attention deficit hyperactivity disorder), combined type   ??? 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 Closed fracture of shaft of metacarpal bone   ??? Claw toe, acquired   ??? Enthesopathy of ankle and tarsus   ??? Enthesopathy of hip region   ??? Hallux valgus, acquired   ??? Pain in joint, ankle and foot   ??? Hand joint pain   ??? Hammer toe, acquired   ??? Bunion   ??? Sprain of metacarpophalangeal joint   ??? Swelling, mass, or lump in head and neck   ??? Sprain of knee   ??? Primary localized osteoarthrosis, lower leg   ??? Posterior vitreous detachment of both eyes   ??? Osteoarthritis of knee   ??? Muscle weakness   ??? Macular hole, right eye   ??? Internal hemorrhoids   ??? History of cervical dysplasia   ??? Other specified hypothyroidism   ??? Failure to thrive in adult   ??? Adjustment disorder with anxiety   ??? Hospice care   ??? Cancer (CMS-HCC)       Future Appointments   Date Time Provider Department Center   11/09/2020 11:40 AM Marca Ancona, MD [facility]   01/10/2021 12:40 PM Leandra Kern, MD [facility]

## 2020-08-16 NOTE — Unmapped (Signed)
I received a return call from [name] husband, Mr. Ernie Hew. He is dissatisfied with the care being provided to his wife at the rehab facility.   I asked him what he would like to see happen for her today and he said to get her out of the nursing home.  I reassured him that the [facility] team is aware of his concern and we will help to make sure he and his wife are satisfied with the care being provided. I told him Dr. Cathey Endow, Dr. Duard Brady, the SNF social worker and [facility] social worker are aware of the situation and will follow-up with him today.  Mr.. Ernie Hew was satisfied with the call and stated he had no further questions.

## 2020-08-16 NOTE — Unmapped (Unsigned)
Cyclophosphamide has been discontinued due to progression of disease, will forward information to pharmacist.

## 2020-08-16 NOTE — Unmapped (Signed)
Call to spouse in response to his concern over his wife's fall

## 2020-08-16 NOTE — Unmapped (Signed)
I followed-up with the social worker at [facility] today in regards to her fall and Mr. [name] frustration with the SNF.      She stated she is working with [name] husband and her sister to come to an agreement on where [name] can receive the safest care for her current condition. According to the social worker, Lisa Burton is a full assist transfer and requires redirection to call for help if she needs to transfer.     The social worker stated she does not believe going home without a full time aide would be safe. The social worker stated the husband is frustrated that the social worker can not provide a full time assistant in their home and he is requesting to take her home.  The social worker stated Lisa Burton is interested in hospice because she is aware she does not have m uch time left and she would like to spend that time at home with family. While in the SNF, Lisa Burton is able to participate in OT with full assist, but it is to be determined if she would qualify for hospice. At this time, [name] husband is not interested in inpatient hospice, although inpatient hospice could be provided at the SNF.    The social worker will follow-up with me with updates.

## 2020-08-16 NOTE — Unmapped (Signed)
I received an inbasket msg from [facility] saying she received a message from [name] spouse, Jesusita Oka,  stating she fell during the night of 5/2 while at [facility]. He is concerned because he feels like she wasn't cared for according to his expectations and he expressed the need to speak with someone at [facility] to address the concern.   I called this evening to discuss the issue and to offer assistance. There was no answer, so I left a detailed msg requesting a callback.  I will call the spouse again tomorrow.

## 2020-08-16 NOTE — Unmapped (Signed)
Opened in error

## 2020-08-17 NOTE — Unmapped (Signed)
Specialty Medication(s): cyclophophamide    [name] has been dis-enrolled from the [facility] specialty pharmacy services due to medication discontinuation resulting from side effect intolerance.    Additional information provided to the patient: no    Rollen Sox  [facility] Specialty Pharmacist
